# Patient Record
Sex: Female | Born: 1955 | Race: White | Hispanic: No | Marital: Married | Smoking: Former smoker
Health system: Southern US, Community
[De-identification: ages and names within clinical notes are randomized; demographics above are authoritative.]

## PROBLEM LIST (undated history)

## (undated) ENCOUNTER — Ambulatory Visit: Admission: EM | Payer: Medicare HMO | Source: Home / Self Care

## (undated) DIAGNOSIS — IMO0001 Reserved for inherently not codable concepts without codable children: Secondary | ICD-10-CM

## (undated) DIAGNOSIS — C50919 Malignant neoplasm of unspecified site of unspecified female breast: Secondary | ICD-10-CM

## (undated) DIAGNOSIS — M1990 Unspecified osteoarthritis, unspecified site: Secondary | ICD-10-CM

## (undated) DIAGNOSIS — J309 Allergic rhinitis, unspecified: Secondary | ICD-10-CM

## (undated) DIAGNOSIS — G4700 Insomnia, unspecified: Secondary | ICD-10-CM

## (undated) DIAGNOSIS — I10 Essential (primary) hypertension: Secondary | ICD-10-CM

## (undated) DIAGNOSIS — R030 Elevated blood-pressure reading, without diagnosis of hypertension: Secondary | ICD-10-CM

## (undated) DIAGNOSIS — N390 Urinary tract infection, site not specified: Secondary | ICD-10-CM

## (undated) DIAGNOSIS — R5383 Other fatigue: Principal | ICD-10-CM

## (undated) DIAGNOSIS — E785 Hyperlipidemia, unspecified: Secondary | ICD-10-CM

## (undated) DIAGNOSIS — F329 Major depressive disorder, single episode, unspecified: Secondary | ICD-10-CM

## (undated) DIAGNOSIS — Z9221 Personal history of antineoplastic chemotherapy: Secondary | ICD-10-CM

## (undated) DIAGNOSIS — Z923 Personal history of irradiation: Secondary | ICD-10-CM

## (undated) DIAGNOSIS — B019 Varicella without complication: Secondary | ICD-10-CM

## (undated) HISTORY — DX: Insomnia, unspecified: G47.00

## (undated) HISTORY — DX: Hyperlipidemia, unspecified: E78.5

## (undated) HISTORY — DX: Urinary tract infection, site not specified: N39.0

## (undated) HISTORY — DX: Reserved for inherently not codable concepts without codable children: IMO0001

## (undated) HISTORY — DX: Allergic rhinitis, unspecified: J30.9

## (undated) HISTORY — PX: TONSILLECTOMY: SUR1361

## (undated) HISTORY — DX: Major depressive disorder, single episode, unspecified: F32.9

## (undated) HISTORY — DX: Varicella without complication: B01.9

## (undated) HISTORY — PX: ABDOMINAL HYSTERECTOMY: SHX81

## (undated) HISTORY — DX: Elevated blood-pressure reading, without diagnosis of hypertension: R03.0

## (undated) HISTORY — DX: Essential (primary) hypertension: I10

## (undated) HISTORY — DX: Other fatigue: R53.83

## (undated) HISTORY — DX: Unspecified osteoarthritis, unspecified site: M19.90

## (undated) HISTORY — PX: TUBAL LIGATION: SHX77

---

## 2011-07-19 ENCOUNTER — Ambulatory Visit: Payer: Self-pay

## 2011-07-29 HISTORY — PX: BREAST BIOPSY: SHX20

## 2011-08-02 ENCOUNTER — Ambulatory Visit: Payer: Self-pay | Admitting: Oncology

## 2011-08-02 LAB — COMPREHENSIVE METABOLIC PANEL
Albumin: 4.5 g/dL (ref 3.4–5.0)
Alkaline Phosphatase: 59 U/L (ref 50–136)
BUN: 12 mg/dL (ref 7–18)
Bilirubin,Total: 0.4 mg/dL (ref 0.2–1.0)
Calcium, Total: 9.5 mg/dL (ref 8.5–10.1)
Chloride: 101 mmol/L (ref 98–107)
Co2: 29 mmol/L (ref 21–32)
Glucose: 104 mg/dL — ABNORMAL HIGH (ref 65–99)
Osmolality: 276 (ref 275–301)
Potassium: 3.6 mmol/L (ref 3.5–5.1)
SGOT(AST): 28 U/L (ref 15–37)
Sodium: 138 mmol/L (ref 136–145)
Total Protein: 8 g/dL (ref 6.4–8.2)

## 2011-08-02 LAB — CBC CANCER CENTER
Basophil #: 0 x10 3/mm (ref 0.0–0.1)
Eosinophil %: 2.1 %
HCT: 44.4 % (ref 35.0–47.0)
HGB: 15.3 g/dL (ref 12.0–16.0)
Lymphocyte #: 2.6 x10 3/mm (ref 1.0–3.6)
MCH: 32 pg (ref 26.0–34.0)
MCHC: 34.4 g/dL (ref 32.0–36.0)
Monocyte #: 0.4 x10 3/mm (ref 0.0–0.7)
Platelet: 218 x10 3/mm (ref 150–440)
RDW: 13.1 % (ref 11.5–14.5)

## 2011-08-03 LAB — CANCER ANTIGEN 27.29: CA 27.29: 14.8 U/mL (ref 0.0–38.6)

## 2011-08-09 ENCOUNTER — Ambulatory Visit: Payer: Self-pay | Admitting: Oncology

## 2011-08-16 ENCOUNTER — Ambulatory Visit: Payer: Self-pay | Admitting: General Surgery

## 2011-08-16 DIAGNOSIS — C50912 Malignant neoplasm of unspecified site of left female breast: Secondary | ICD-10-CM | POA: Insufficient documentation

## 2011-08-29 ENCOUNTER — Ambulatory Visit: Payer: Self-pay | Admitting: Oncology

## 2011-08-29 DIAGNOSIS — Z9221 Personal history of antineoplastic chemotherapy: Secondary | ICD-10-CM

## 2011-08-29 HISTORY — DX: Personal history of antineoplastic chemotherapy: Z92.21

## 2011-08-31 LAB — CBC CANCER CENTER
Basophil #: 0 x10 3/mm (ref 0.0–0.1)
Basophil %: 0.6 %
Eosinophil #: 0.3 x10 3/mm (ref 0.0–0.7)
Eosinophil %: 4 %
HCT: 40.4 % (ref 35.0–47.0)
Lymphocyte #: 2.4 x10 3/mm (ref 1.0–3.6)
Lymphocyte %: 35.9 %
MCH: 32.1 pg (ref 26.0–34.0)
MCV: 94 fL (ref 80–100)
Monocyte %: 5.5 %
Neutrophil #: 3.6 x10 3/mm (ref 1.4–6.5)
Neutrophil %: 54 %
Platelet: 193 x10 3/mm (ref 150–440)
RDW: 12.9 % (ref 11.5–14.5)
WBC: 6.7 x10 3/mm (ref 3.6–11.0)

## 2011-08-31 LAB — BASIC METABOLIC PANEL
Anion Gap: 12 (ref 7–16)
BUN: 12 mg/dL (ref 7–18)
Calcium, Total: 8.9 mg/dL (ref 8.5–10.1)
Co2: 29 mmol/L (ref 21–32)
EGFR (African American): >60
Glucose: 112 mg/dL — ABNORMAL HIGH (ref 65–99)
Osmolality: 282 (ref 275–301)
Sodium: 141 mmol/L (ref 136–145)

## 2011-09-07 LAB — CBC CANCER CENTER
Basophil #: 0 x10 3/mm (ref 0.0–0.1)
Basophil %: 0.2 %
Eosinophil #: 0.2 x10 3/mm (ref 0.0–0.7)
Eosinophil %: 3.5 %
HCT: 38.7 % (ref 35.0–47.0)
HGB: 13.5 g/dL (ref 12.0–16.0)
MCHC: 34.8 g/dL (ref 32.0–36.0)
MCV: 93 fL (ref 80–100)
Neutrophil %: 66.2 %
RBC: 4.15 10*6/uL (ref 3.80–5.20)
RDW: 13 % (ref 11.5–14.5)
WBC: 5.1 x10 3/mm (ref 3.6–11.0)

## 2011-09-14 LAB — CBC CANCER CENTER
Basophil %: 1.4 %
Eosinophil #: 0 x10 3/mm (ref 0.0–0.7)
Eosinophil %: 1.9 %
HCT: 40.1 % (ref 35.0–47.0)
HGB: 13.4 g/dL (ref 12.0–16.0)
Lymphocyte %: 73.2 %
MCV: 95 fL (ref 80–100)
Monocyte #: 0.3 x10 3/mm (ref 0.2–0.9)
Monocyte %: 12.8 %
Neutrophil #: 0.2 x10 3/mm — ABNORMAL LOW (ref 1.4–6.5)
Neutrophil %: 10.7 %
Platelet: 236 x10 3/mm (ref 150–440)
RBC: 4.22 10*6/uL (ref 3.80–5.20)
RDW: 12.5 % (ref 11.5–14.5)
WBC: 2.2 x10 3/mm — ABNORMAL LOW (ref 3.6–11.0)

## 2011-09-21 LAB — CBC CANCER CENTER
Basophil #: 0 x10 3/mm (ref 0.0–0.1)
Basophil %: 0.5 %
Eosinophil #: 0 x10 3/mm (ref 0.0–0.7)
Eosinophil %: 0.5 %
Lymphocyte #: 2.1 x10 3/mm (ref 1.0–3.6)
Lymphocyte %: 26.3 %
MCH: 32.3 pg (ref 26.0–34.0)
MCHC: 33.7 g/dL (ref 32.0–36.0)
MCV: 96 fL (ref 80–100)
Neutrophil #: 5.3 x10 3/mm (ref 1.4–6.5)
Neutrophil %: 67.2 %
Platelet: 224 x10 3/mm (ref 150–440)
RBC: 4.09 10*6/uL (ref 3.80–5.20)
WBC: 7.9 x10 3/mm (ref 3.6–11.0)

## 2011-09-21 LAB — COMPREHENSIVE METABOLIC PANEL
Albumin: 3.8 g/dL (ref 3.4–5.0)
Alkaline Phosphatase: 78 U/L (ref 50–136)
BUN: 8 mg/dL (ref 7–18)
Bilirubin,Total: 0.2 mg/dL (ref 0.2–1.0)
Calcium, Total: 8.8 mg/dL (ref 8.5–10.1)
Chloride: 100 mmol/L (ref 98–107)
Co2: 32 mmol/L (ref 21–32)
Creatinine: 0.69 mg/dL (ref 0.60–1.30)
EGFR (African American): >60
EGFR (Non-African Amer.): >60
Osmolality: 285 (ref 275–301)
Potassium: 2.9 mmol/L — ABNORMAL LOW (ref 3.5–5.1)
SGOT(AST): 25 U/L (ref 15–37)
Sodium: 142 mmol/L (ref 136–145)
Total Protein: 7.2 g/dL (ref 6.4–8.2)

## 2011-09-28 ENCOUNTER — Ambulatory Visit: Payer: Self-pay | Admitting: Oncology

## 2011-09-28 LAB — CBC CANCER CENTER
Basophil %: 1.2 %
Eosinophil #: 0.1 x10 3/mm (ref 0.0–0.7)
Eosinophil %: 3.4 %
HCT: 38.4 % (ref 35.0–47.0)
Lymphocyte %: 59.9 %
MCH: 31.9 pg (ref 26.0–34.0)
MCV: 96 fL (ref 80–100)
Neutrophil #: 0.5 x10 3/mm — ABNORMAL LOW (ref 1.4–6.5)
RBC: 4.02 10*6/uL (ref 3.80–5.20)
RDW: 13.5 % (ref 11.5–14.5)
WBC: 1.7 x10 3/mm — CL (ref 3.6–11.0)

## 2011-09-29 LAB — POTASSIUM: Potassium: 3.7 mmol/L (ref 3.5–5.1)

## 2011-09-29 LAB — MAGNESIUM: Magnesium: 2 mg/dL

## 2011-10-05 LAB — CREATININE, SERUM
Creatinine: 0.62 mg/dL (ref 0.60–1.30)
EGFR (African American): >60

## 2011-10-05 LAB — CBC CANCER CENTER
Basophil #: 0.1 x10 3/mm (ref 0.0–0.1)
Basophil %: 0.5 %
Eosinophil #: 0.1 x10 3/mm (ref 0.0–0.7)
HCT: 39.8 % (ref 35.0–47.0)
Lymphocyte #: 1.9 x10 3/mm (ref 1.0–3.6)
MCHC: 33.3 g/dL (ref 32.0–36.0)
MCV: 95 fL (ref 80–100)
Monocyte #: 0.6 x10 3/mm (ref 0.2–0.9)
Monocyte %: 4.9 %
Neutrophil %: 79.3 %
Platelet: 123 x10 3/mm — ABNORMAL LOW (ref 150–440)
WBC: 12.5 x10 3/mm — ABNORMAL HIGH (ref 3.6–11.0)

## 2011-10-05 LAB — HEPATIC FUNCTION PANEL A (ARMC)
Bilirubin, Direct: 0 mg/dL (ref 0.00–0.20)
Bilirubin,Total: 0.2 mg/dL (ref 0.2–1.0)
SGOT(AST): 30 U/L (ref 15–37)
SGPT (ALT): 32 U/L

## 2011-10-12 LAB — CBC CANCER CENTER
Basophil #: 0 x10 3/mm (ref 0.0–0.1)
Eosinophil #: 0 x10 3/mm (ref 0.0–0.7)
Eosinophil %: 0.4 %
HCT: 36.1 % (ref 35.0–47.0)
Lymphocyte #: 1.2 x10 3/mm (ref 1.0–3.6)
Lymphocyte %: 16.4 %
MCV: 97 fL (ref 80–100)
Monocyte #: 0.6 x10 3/mm (ref 0.2–0.9)
Monocyte %: 8.8 %
Neutrophil #: 5.2 x10 3/mm (ref 1.4–6.5)
Neutrophil %: 73.9 %
RDW: 14.8 % — ABNORMAL HIGH (ref 11.5–14.5)

## 2011-10-19 LAB — CBC CANCER CENTER
Basophil %: 0.7 %
Eosinophil #: 0.1 x10 3/mm (ref 0.0–0.7)
Eosinophil %: 2.2 %
HGB: 11.9 g/dL — ABNORMAL LOW (ref 12.0–16.0)
Lymphocyte #: 0.8 x10 3/mm — ABNORMAL LOW (ref 1.0–3.6)
Lymphocyte %: 24.9 %
MCHC: 33.6 g/dL (ref 32.0–36.0)
MCV: 96 fL (ref 80–100)
Monocyte #: 0.1 x10 3/mm — ABNORMAL LOW (ref 0.2–0.9)
Monocyte %: 4.3 %
Neutrophil %: 67.9 %

## 2011-10-26 LAB — CBC CANCER CENTER
Basophil #: 0.1 x10 3/mm (ref 0.0–0.1)
Basophil %: 0.7 %
Eosinophil #: 0.1 x10 3/mm (ref 0.0–0.7)
Eosinophil %: 0.8 %
HGB: 12.4 g/dL (ref 12.0–16.0)
Lymphocyte #: 1.4 x10 3/mm (ref 1.0–3.6)
Lymphocyte %: 13.2 %
MCHC: 33.5 g/dL (ref 32.0–36.0)
MCV: 98 fL (ref 80–100)
Monocyte #: 0.6 x10 3/mm (ref 0.2–0.9)
Monocyte %: 5.8 %
Neutrophil %: 79.5 %
RBC: 3.78 10*6/uL — ABNORMAL LOW (ref 3.80–5.20)
RDW: 16.5 % — ABNORMAL HIGH (ref 11.5–14.5)
WBC: 11 x10 3/mm (ref 3.6–11.0)

## 2011-10-29 ENCOUNTER — Ambulatory Visit: Payer: Self-pay | Admitting: Oncology

## 2011-11-02 LAB — CBC CANCER CENTER
Eosinophil %: 0.5 %
HGB: 12 g/dL (ref 12.0–16.0)
Lymphocyte #: 0.8 x10 3/mm — ABNORMAL LOW (ref 1.0–3.6)
Lymphocyte %: 11.6 %
MCHC: 33.4 g/dL (ref 32.0–36.0)
Monocyte #: 0.6 x10 3/mm (ref 0.2–0.9)
Neutrophil #: 5.7 x10 3/mm (ref 1.4–6.5)
Neutrophil %: 79 %
RBC: 3.62 10*6/uL — ABNORMAL LOW (ref 3.80–5.20)

## 2011-11-02 LAB — COMPREHENSIVE METABOLIC PANEL
Alkaline Phosphatase: 64 U/L (ref 50–136)
Anion Gap: 9 (ref 7–16)
Chloride: 102 mmol/L (ref 98–107)
Co2: 29 mmol/L (ref 21–32)
EGFR (Non-African Amer.): >60
Glucose: 127 mg/dL — ABNORMAL HIGH (ref 65–99)
Osmolality: 279 (ref 275–301)
Sodium: 140 mmol/L (ref 136–145)

## 2011-11-09 LAB — CBC CANCER CENTER
Basophil %: 1.3 %
Eosinophil #: 0.1 x10 3/mm (ref 0.0–0.7)
Lymphocyte %: 40.9 %
MCH: 33.4 pg (ref 26.0–34.0)
Monocyte #: 0.2 x10 3/mm (ref 0.2–0.9)
Monocyte %: 8 %
Neutrophil #: 1 x10 3/mm — ABNORMAL LOW (ref 1.4–6.5)
WBC: 2 x10 3/mm — CL (ref 3.6–11.0)

## 2011-11-16 LAB — CBC CANCER CENTER
Basophil %: 0.5 %
Eosinophil %: 0.5 %
HCT: 36.4 % (ref 35.0–47.0)
Lymphocyte %: 11.3 %
MCH: 33.3 pg (ref 26.0–34.0)
MCV: 101 fL — ABNORMAL HIGH (ref 80–100)
Monocyte #: 0.3 x10 3/mm (ref 0.2–0.9)
Neutrophil #: 7.2 x10 3/mm — ABNORMAL HIGH (ref 1.4–6.5)
RBC: 3.6 10*6/uL — ABNORMAL LOW (ref 3.80–5.20)
RDW: 17.9 % — ABNORMAL HIGH (ref 11.5–14.5)
WBC: 8.5 x10 3/mm (ref 3.6–11.0)

## 2011-11-23 LAB — CBC CANCER CENTER
Basophil #: 0 x10 3/mm (ref 0.0–0.1)
Eosinophil %: 0.2 %
HCT: 35.4 % (ref 35.0–47.0)
HGB: 11.7 g/dL — ABNORMAL LOW (ref 12.0–16.0)
MCHC: 33.2 g/dL (ref 32.0–36.0)
Monocyte #: 0.7 x10 3/mm (ref 0.2–0.9)
Monocyte %: 8.3 %
Platelet: 237 x10 3/mm (ref 150–440)
RBC: 3.47 10*6/uL — ABNORMAL LOW (ref 3.80–5.20)
RDW: 18.2 % — ABNORMAL HIGH (ref 11.5–14.5)

## 2011-11-23 LAB — COMPREHENSIVE METABOLIC PANEL
Albumin: 3.9 g/dL (ref 3.4–5.0)
Alkaline Phosphatase: 59 U/L (ref 50–136)
BUN: 12 mg/dL (ref 7–18)
Calcium, Total: 8.9 mg/dL (ref 8.5–10.1)
Chloride: 103 mmol/L (ref 98–107)
Co2: 27 mmol/L (ref 21–32)
Osmolality: 282 (ref 275–301)
Potassium: 3.6 mmol/L (ref 3.5–5.1)
SGPT (ALT): 31 U/L
Total Protein: 7 g/dL (ref 6.4–8.2)

## 2011-11-28 ENCOUNTER — Ambulatory Visit: Payer: Self-pay | Admitting: Oncology

## 2011-11-30 LAB — COMPREHENSIVE METABOLIC PANEL
Alkaline Phosphatase: 64 U/L (ref 50–136)
Calcium, Total: 9.3 mg/dL (ref 8.5–10.1)
Chloride: 104 mmol/L (ref 98–107)
Co2: 28 mmol/L (ref 21–32)
Creatinine: 0.65 mg/dL (ref 0.60–1.30)
EGFR (African American): >60
EGFR (Non-African Amer.): >60
SGOT(AST): 23 U/L (ref 15–37)
SGPT (ALT): 35 U/L

## 2011-11-30 LAB — CBC CANCER CENTER
Basophil #: 0 x10 3/mm (ref 0.0–0.1)
Basophil %: 0.8 %
Eosinophil #: 0.1 x10 3/mm (ref 0.0–0.7)
HCT: 34.9 % — ABNORMAL LOW (ref 35.0–47.0)
HGB: 11.7 g/dL — ABNORMAL LOW (ref 12.0–16.0)
Lymphocyte #: 1 x10 3/mm (ref 1.0–3.6)
Lymphocyte %: 20.5 %
MCH: 34.6 pg — ABNORMAL HIGH (ref 26.0–34.0)
MCHC: 33.5 g/dL (ref 32.0–36.0)
MCV: 103 fL — ABNORMAL HIGH (ref 80–100)
Monocyte %: 6.5 %
Neutrophil #: 3.4 x10 3/mm (ref 1.4–6.5)
RDW: 17.1 % — ABNORMAL HIGH (ref 11.5–14.5)

## 2011-12-07 LAB — CBC CANCER CENTER
Basophil #: 0 x10 3/mm (ref 0.0–0.1)
Basophil %: 0.9 %
Eosinophil #: 0.2 x10 3/mm (ref 0.0–0.7)
Eosinophil %: 4 %
HCT: 36.5 % (ref 35.0–47.0)
HGB: 12.3 g/dL (ref 12.0–16.0)
Lymphocyte #: 0.8 x10 3/mm — ABNORMAL LOW (ref 1.0–3.6)
MCH: 35.1 pg — ABNORMAL HIGH (ref 26.0–34.0)
MCV: 104 fL — ABNORMAL HIGH (ref 80–100)
Monocyte %: 6.6 %
Neutrophil #: 3.7 x10 3/mm (ref 1.4–6.5)
Neutrophil %: 73 %
Platelet: 195 x10 3/mm (ref 150–440)
RBC: 3.5 10*6/uL — ABNORMAL LOW (ref 3.80–5.20)
WBC: 5.1 x10 3/mm (ref 3.6–11.0)

## 2011-12-14 LAB — CBC CANCER CENTER
Basophil #: 0.1 x10 3/mm (ref 0.0–0.1)
Basophil %: 1.1 %
Eosinophil #: 0.2 x10 3/mm (ref 0.0–0.7)
HCT: 36.2 % (ref 35.0–47.0)
HGB: 12.1 g/dL (ref 12.0–16.0)
Lymphocyte #: 0.9 x10 3/mm — ABNORMAL LOW (ref 1.0–3.6)
Lymphocyte %: 17.9 %
MCH: 35.1 pg — ABNORMAL HIGH (ref 26.0–34.0)
MCHC: 33.5 g/dL (ref 32.0–36.0)
MCV: 105 fL — ABNORMAL HIGH (ref 80–100)
Monocyte #: 0.3 x10 3/mm (ref 0.2–0.9)
Neutrophil #: 3.7 x10 3/mm (ref 1.4–6.5)
Neutrophil %: 71.7 %
Platelet: 199 x10 3/mm (ref 150–440)
RDW: 15 % — ABNORMAL HIGH (ref 11.5–14.5)
WBC: 5.1 x10 3/mm (ref 3.6–11.0)

## 2011-12-14 LAB — COMPREHENSIVE METABOLIC PANEL
Albumin: 3.9 g/dL (ref 3.4–5.0)
Alkaline Phosphatase: 54 U/L (ref 50–136)
Anion Gap: 9 (ref 7–16)
BUN: 9 mg/dL (ref 7–18)
Bilirubin,Total: 0.3 mg/dL (ref 0.2–1.0)
Calcium, Total: 9.1 mg/dL (ref 8.5–10.1)
Glucose: 109 mg/dL — ABNORMAL HIGH (ref 65–99)
Osmolality: 281 (ref 275–301)
Potassium: 3.5 mmol/L (ref 3.5–5.1)
SGOT(AST): 25 U/L (ref 15–37)
SGPT (ALT): 38 U/L
Sodium: 141 mmol/L (ref 136–145)
Total Protein: 7 g/dL (ref 6.4–8.2)

## 2011-12-21 LAB — CBC CANCER CENTER
Basophil #: 0 x10 3/mm (ref 0.0–0.1)
Basophil %: 0.7 %
Eosinophil #: 0.1 x10 3/mm (ref 0.0–0.7)
HGB: 12.2 g/dL (ref 12.0–16.0)
Lymphocyte %: 14.6 %
MCHC: 33.9 g/dL (ref 32.0–36.0)
MCV: 105 fL — ABNORMAL HIGH (ref 80–100)
Monocyte %: 7.1 %
Neutrophil #: 4.6 x10 3/mm (ref 1.4–6.5)
Neutrophil %: 75.8 %
RBC: 3.43 10*6/uL — ABNORMAL LOW (ref 3.80–5.20)
WBC: 6.1 x10 3/mm (ref 3.6–11.0)

## 2011-12-21 LAB — COMPREHENSIVE METABOLIC PANEL
Alkaline Phosphatase: 64 U/L (ref 50–136)
Anion Gap: 10 (ref 7–16)
Bilirubin,Total: 0.3 mg/dL (ref 0.2–1.0)
Chloride: 102 mmol/L (ref 98–107)
Co2: 29 mmol/L (ref 21–32)
Creatinine: 0.7 mg/dL (ref 0.60–1.30)
EGFR (African American): >60
EGFR (Non-African Amer.): >60
Potassium: 3.5 mmol/L (ref 3.5–5.1)
SGOT(AST): 24 U/L (ref 15–37)
SGPT (ALT): 38 U/L

## 2011-12-28 LAB — COMPREHENSIVE METABOLIC PANEL
Albumin: 4.1 g/dL (ref 3.4–5.0)
Alkaline Phosphatase: 64 U/L (ref 50–136)
BUN: 7 mg/dL (ref 7–18)
Bilirubin,Total: 0.4 mg/dL (ref 0.2–1.0)
Chloride: 100 mmol/L (ref 98–107)
Creatinine: 0.73 mg/dL (ref 0.60–1.30)
EGFR (African American): >60
Glucose: 133 mg/dL — ABNORMAL HIGH (ref 65–99)
Osmolality: 277 (ref 275–301)
SGPT (ALT): 36 U/L
Total Protein: 7.5 g/dL (ref 6.4–8.2)

## 2011-12-28 LAB — CBC CANCER CENTER
Basophil %: 1 %
Eosinophil #: 0.1 x10 3/mm (ref 0.0–0.7)
Eosinophil %: 1.5 %
HCT: 37.3 % (ref 35.0–47.0)
HGB: 12.9 g/dL (ref 12.0–16.0)
Lymphocyte %: 17.5 %
Monocyte %: 4.7 %
Neutrophil #: 4.5 x10 3/mm (ref 1.4–6.5)
WBC: 6 x10 3/mm (ref 3.6–11.0)

## 2011-12-29 ENCOUNTER — Ambulatory Visit: Payer: Self-pay | Admitting: Oncology

## 2012-01-04 LAB — CBC CANCER CENTER
Basophil %: 0.6 %
Eosinophil %: 1.7 %
HCT: 36.2 % (ref 35.0–47.0)
HGB: 12.5 g/dL (ref 12.0–16.0)
Lymphocyte %: 15.3 %
MCH: 36.7 pg — ABNORMAL HIGH (ref 26.0–34.0)
MCHC: 34.6 g/dL (ref 32.0–36.0)
MCV: 106 fL — ABNORMAL HIGH (ref 80–100)
Monocyte %: 7.2 %
Neutrophil #: 3.9 x10 3/mm (ref 1.4–6.5)
Neutrophil %: 75.2 %
Platelet: 258 x10 3/mm (ref 150–440)
RBC: 3.42 10*6/uL — ABNORMAL LOW (ref 3.80–5.20)
RDW: 13.1 % (ref 11.5–14.5)
WBC: 5.2 x10 3/mm (ref 3.6–11.0)

## 2012-01-11 LAB — CBC CANCER CENTER
Basophil %: 0.7 %
Eosinophil #: 0 x10 3/mm (ref 0.0–0.7)
Eosinophil %: 0.6 %
HGB: 12.1 g/dL (ref 12.0–16.0)
MCH: 35.3 pg — ABNORMAL HIGH (ref 26.0–34.0)
MCV: 105 fL — ABNORMAL HIGH (ref 80–100)
Monocyte #: 0.3 x10 3/mm (ref 0.2–0.9)
Monocyte %: 6.2 %
Neutrophil #: 3.7 x10 3/mm (ref 1.4–6.5)
Neutrophil %: 82.3 %
Platelet: 206 x10 3/mm (ref 150–440)
RBC: 3.43 10*6/uL — ABNORMAL LOW (ref 3.80–5.20)
WBC: 4.6 x10 3/mm (ref 3.6–11.0)

## 2012-01-11 LAB — COMPREHENSIVE METABOLIC PANEL
Albumin: 4.1 g/dL (ref 3.4–5.0)
Alkaline Phosphatase: 72 U/L (ref 50–136)
Anion Gap: 11 (ref 7–16)
BUN: 8 mg/dL (ref 7–18)
Bilirubin,Total: 0.4 mg/dL (ref 0.2–1.0)
Co2: 29 mmol/L (ref 21–32)
Creatinine: 0.81 mg/dL (ref 0.60–1.30)
Glucose: 119 mg/dL — ABNORMAL HIGH (ref 65–99)
Potassium: 3 mmol/L — ABNORMAL LOW (ref 3.5–5.1)
SGPT (ALT): 35 U/L (ref 12–78)
Total Protein: 7.3 g/dL (ref 6.4–8.2)

## 2012-01-18 LAB — CBC CANCER CENTER
Basophil #: 0.1 x10 3/mm (ref 0.0–0.1)
Basophil %: 1.4 %
Eosinophil #: 0.1 x10 3/mm (ref 0.0–0.7)
Eosinophil %: 1 %
HGB: 12.6 g/dL (ref 12.0–16.0)
Lymphocyte %: 19.6 %
MCH: 35.3 pg — ABNORMAL HIGH (ref 26.0–34.0)
MCHC: 33.7 g/dL (ref 32.0–36.0)
Monocyte #: 0.3 x10 3/mm (ref 0.2–0.9)
Monocyte %: 5.6 %
Neutrophil #: 4.1 x10 3/mm (ref 1.4–6.5)
Neutrophil %: 72.4 %
RBC: 3.55 10*6/uL — ABNORMAL LOW (ref 3.80–5.20)
WBC: 5.7 x10 3/mm (ref 3.6–11.0)

## 2012-01-18 LAB — COMPREHENSIVE METABOLIC PANEL
Albumin: 4.1 g/dL (ref 3.4–5.0)
Anion Gap: 7 (ref 7–16)
BUN: 7 mg/dL (ref 7–18)
Bilirubin,Total: 0.3 mg/dL (ref 0.2–1.0)
Chloride: 102 mmol/L (ref 98–107)
Creatinine: 0.68 mg/dL (ref 0.60–1.30)
EGFR (African American): >60
Glucose: 115 mg/dL — ABNORMAL HIGH (ref 65–99)
SGOT(AST): 28 U/L (ref 15–37)
SGPT (ALT): 32 U/L (ref 12–78)
Sodium: 139 mmol/L (ref 136–145)
Total Protein: 7.3 g/dL (ref 6.4–8.2)

## 2012-01-25 LAB — CBC CANCER CENTER
Basophil #: 0 x10 3/mm (ref 0.0–0.1)
Eosinophil #: 0.1 x10 3/mm (ref 0.0–0.7)
Eosinophil %: 1.1 %
Lymphocyte #: 1 x10 3/mm (ref 1.0–3.6)
MCH: 35.3 pg — ABNORMAL HIGH (ref 26.0–34.0)
MCHC: 33.8 g/dL (ref 32.0–36.0)
MCV: 105 fL — ABNORMAL HIGH (ref 80–100)
Monocyte #: 0.4 x10 3/mm (ref 0.2–0.9)
Neutrophil #: 3.8 x10 3/mm (ref 1.4–6.5)
Neutrophil %: 72.5 %
Platelet: 214 x10 3/mm (ref 150–440)
RBC: 3.47 10*6/uL — ABNORMAL LOW (ref 3.80–5.20)
RDW: 13.1 % (ref 11.5–14.5)

## 2012-01-25 LAB — COMPREHENSIVE METABOLIC PANEL
Albumin: 3.9 g/dL (ref 3.4–5.0)
BUN: 7 mg/dL (ref 7–18)
Bilirubin,Total: 0.2 mg/dL (ref 0.2–1.0)
Calcium, Total: 9 mg/dL (ref 8.5–10.1)
Co2: 30 mmol/L (ref 21–32)
Creatinine: 0.78 mg/dL (ref 0.60–1.30)
EGFR (Non-African Amer.): >60
Glucose: 104 mg/dL — ABNORMAL HIGH (ref 65–99)
Osmolality: 278 (ref 275–301)
Potassium: 3.2 mmol/L — ABNORMAL LOW (ref 3.5–5.1)
Sodium: 140 mmol/L (ref 136–145)
Total Protein: 6.9 g/dL (ref 6.4–8.2)

## 2012-01-29 ENCOUNTER — Ambulatory Visit: Payer: Self-pay | Admitting: Oncology

## 2012-02-01 LAB — CBC CANCER CENTER
Basophil %: 0.5 %
Eosinophil %: 1.1 %
Lymphocyte %: 18.2 %
Monocyte #: 0.3 x10 3/mm (ref 0.2–0.9)
Monocyte %: 5.2 %
Neutrophil %: 75 %
Platelet: 210 x10 3/mm (ref 150–440)
RBC: 3.58 10*6/uL — ABNORMAL LOW (ref 3.80–5.20)
WBC: 5.6 x10 3/mm (ref 3.6–11.0)

## 2012-02-01 LAB — COMPREHENSIVE METABOLIC PANEL
Albumin: 3.9 g/dL (ref 3.4–5.0)
Anion Gap: 10 (ref 7–16)
Bilirubin,Total: 0.3 mg/dL (ref 0.2–1.0)
Chloride: 98 mmol/L (ref 98–107)
Co2: 30 mmol/L (ref 21–32)
Creatinine: 0.87 mg/dL (ref 0.60–1.30)
Glucose: 165 mg/dL — ABNORMAL HIGH (ref 65–99)
Potassium: 2.9 mmol/L — ABNORMAL LOW (ref 3.5–5.1)
SGOT(AST): 22 U/L (ref 15–37)
SGPT (ALT): 26 U/L (ref 12–78)
Total Protein: 7.2 g/dL (ref 6.4–8.2)

## 2012-02-08 LAB — CBC CANCER CENTER
Basophil %: 0.4 %
Eosinophil %: 1.1 %
HCT: 37.5 % (ref 35.0–47.0)
HGB: 12.5 g/dL (ref 12.0–16.0)
Lymphocyte #: 0.9 x10 3/mm — ABNORMAL LOW (ref 1.0–3.6)
Lymphocyte %: 16.6 %
MCV: 104 fL — ABNORMAL HIGH (ref 80–100)
Monocyte %: 5.6 %
Neutrophil #: 4.1 x10 3/mm (ref 1.4–6.5)
Neutrophil %: 76.3 %
Platelet: 218 x10 3/mm (ref 150–440)
RBC: 3.59 10*6/uL — ABNORMAL LOW (ref 3.80–5.20)
RDW: 12.8 % (ref 11.5–14.5)

## 2012-02-08 LAB — COMPREHENSIVE METABOLIC PANEL
Albumin: 4 g/dL (ref 3.4–5.0)
Alkaline Phosphatase: 58 U/L (ref 50–136)
Bilirubin,Total: 0.4 mg/dL (ref 0.2–1.0)
Calcium, Total: 9.1 mg/dL (ref 8.5–10.1)
Chloride: 100 mmol/L (ref 98–107)
Creatinine: 0.82 mg/dL (ref 0.60–1.30)
Glucose: 132 mg/dL — ABNORMAL HIGH (ref 65–99)
Osmolality: 278 (ref 275–301)
SGOT(AST): 24 U/L (ref 15–37)
Sodium: 139 mmol/L (ref 136–145)
Total Protein: 7.2 g/dL (ref 6.4–8.2)

## 2012-02-28 ENCOUNTER — Ambulatory Visit: Payer: Self-pay | Admitting: Oncology

## 2012-02-28 DIAGNOSIS — C50919 Malignant neoplasm of unspecified site of unspecified female breast: Secondary | ICD-10-CM

## 2012-02-28 HISTORY — DX: Malignant neoplasm of unspecified site of unspecified female breast: C50.919

## 2012-03-07 ENCOUNTER — Ambulatory Visit: Payer: Self-pay | Admitting: Oncology

## 2012-03-07 LAB — CBC CANCER CENTER
Basophil %: 0.7 %
Eosinophil #: 0.3 x10 3/mm (ref 0.0–0.7)
Eosinophil %: 4.5 %
HCT: 41.5 % (ref 35.0–47.0)
HGB: 13.8 g/dL (ref 12.0–16.0)
Lymphocyte #: 1 x10 3/mm (ref 1.0–3.6)
Lymphocyte %: 15.2 %
MCHC: 33.2 g/dL (ref 32.0–36.0)
MCV: 101 fL — ABNORMAL HIGH (ref 80–100)
Monocyte %: 6.7 %
Neutrophil #: 4.7 x10 3/mm (ref 1.4–6.5)
RBC: 4.1 10*6/uL (ref 3.80–5.20)
RDW: 12.9 % (ref 11.5–14.5)

## 2012-03-07 LAB — COMPREHENSIVE METABOLIC PANEL
Alkaline Phosphatase: 65 U/L (ref 50–136)
Anion Gap: 12 (ref 7–16)
Bilirubin,Total: 0.5 mg/dL (ref 0.2–1.0)
Calcium, Total: 9.4 mg/dL (ref 8.5–10.1)
Co2: 29 mmol/L (ref 21–32)
EGFR (African American): >60
EGFR (Non-African Amer.): >60
Glucose: 117 mg/dL — ABNORMAL HIGH (ref 65–99)
Osmolality: 281 (ref 275–301)
Potassium: 3.1 mmol/L — ABNORMAL LOW (ref 3.5–5.1)
SGOT(AST): 24 U/L (ref 15–37)
SGPT (ALT): 35 U/L (ref 12–78)

## 2012-03-16 ENCOUNTER — Ambulatory Visit: Payer: Self-pay | Admitting: Surgery

## 2012-03-16 HISTORY — PX: BREAST LUMPECTOMY: SHX2

## 2012-03-30 ENCOUNTER — Ambulatory Visit: Payer: Self-pay | Admitting: Oncology

## 2012-04-23 LAB — CBC CANCER CENTER
Basophil #: 0 x10 3/mm (ref 0.0–0.1)
Basophil %: 0.9 %
Eosinophil #: 0.2 x10 3/mm (ref 0.0–0.7)
Eosinophil %: 3.4 %
HGB: 13.9 g/dL (ref 12.0–16.0)
Lymphocyte %: 17.8 %
MCV: 99 fL (ref 80–100)
Monocyte #: 0.4 x10 3/mm (ref 0.2–0.9)
Monocyte %: 6.9 %
Platelet: 188 x10 3/mm (ref 150–440)
RBC: 4.3 10*6/uL (ref 3.80–5.20)
RDW: 12.7 % (ref 11.5–14.5)
WBC: 5.1 x10 3/mm (ref 3.6–11.0)

## 2012-04-29 ENCOUNTER — Ambulatory Visit: Payer: Self-pay | Admitting: Oncology

## 2012-04-30 LAB — CBC CANCER CENTER
Eosinophil #: 0.2 x10 3/mm (ref 0.0–0.7)
Eosinophil %: 3.2 %
MCHC: 34 g/dL (ref 32.0–36.0)
Monocyte %: 6.3 %
Neutrophil #: 3.7 x10 3/mm (ref 1.4–6.5)
Neutrophil %: 72 %
Platelet: 181 x10 3/mm (ref 150–440)
RBC: 4.21 10*6/uL (ref 3.80–5.20)
RDW: 12.5 % (ref 11.5–14.5)
WBC: 5.1 x10 3/mm (ref 3.6–11.0)

## 2012-05-07 LAB — CBC CANCER CENTER
Basophil #: 0 x10 3/mm (ref 0.0–0.1)
Eosinophil #: 0.2 x10 3/mm (ref 0.0–0.7)
Eosinophil %: 3.4 %
HGB: 13.7 g/dL (ref 12.0–16.0)
Lymphocyte #: 0.8 x10 3/mm — ABNORMAL LOW (ref 1.0–3.6)
MCV: 96 fL (ref 80–100)
Monocyte #: 0.3 x10 3/mm (ref 0.2–0.9)
Neutrophil %: 70.4 %
Platelet: 165 x10 3/mm (ref 150–440)
RBC: 4.15 10*6/uL (ref 3.80–5.20)
RDW: 12.9 % (ref 11.5–14.5)
WBC: 4.5 x10 3/mm (ref 3.6–11.0)

## 2012-05-14 LAB — CBC CANCER CENTER
Basophil #: 0 x10 3/mm (ref 0.0–0.1)
Eosinophil #: 0.2 x10 3/mm (ref 0.0–0.7)
HCT: 42.1 % (ref 35.0–47.0)
HGB: 14.5 g/dL (ref 12.0–16.0)
Lymphocyte %: 17.2 %
MCHC: 34.4 g/dL (ref 32.0–36.0)
MCV: 95 fL (ref 80–100)
Monocyte %: 6.3 %
Platelet: 177 x10 3/mm (ref 150–440)
RDW: 12.9 % (ref 11.5–14.5)

## 2012-05-21 LAB — CBC CANCER CENTER
Basophil %: 0.9 %
Eosinophil #: 0.2 x10 3/mm (ref 0.0–0.7)
Eosinophil %: 4.4 %
HCT: 39.2 % (ref 35.0–47.0)
Lymphocyte %: 18.9 %
MCV: 95 fL (ref 80–100)
Neutrophil #: 3.3 x10 3/mm (ref 1.4–6.5)
Platelet: 179 x10 3/mm (ref 150–440)
RBC: 4.15 10*6/uL (ref 3.80–5.20)
WBC: 5 x10 3/mm (ref 3.6–11.0)

## 2012-05-28 LAB — CBC CANCER CENTER
Basophil %: 0.8 %
Eosinophil #: 0.2 x10 3/mm (ref 0.0–0.7)
Eosinophil %: 4.5 %
Lymphocyte #: 0.7 x10 3/mm — ABNORMAL LOW (ref 1.0–3.6)
Lymphocyte %: 14.6 %
MCH: 32.6 pg (ref 26.0–34.0)
MCHC: 34.1 g/dL (ref 32.0–36.0)
Monocyte #: 0.4 x10 3/mm (ref 0.2–0.9)
Neutrophil %: 70.8 %
Platelet: 167 x10 3/mm (ref 150–440)
RBC: 4.35 10*6/uL (ref 3.80–5.20)

## 2012-05-30 ENCOUNTER — Ambulatory Visit: Payer: Self-pay | Admitting: Oncology

## 2012-06-04 LAB — CBC CANCER CENTER
Basophil #: 0.1 x10 3/mm (ref 0.0–0.1)
Basophil %: 0.8 %
Eosinophil %: 3.3 %
HGB: 14.7 g/dL (ref 12.0–16.0)
Lymphocyte #: 0.7 x10 3/mm — ABNORMAL LOW (ref 1.0–3.6)
Lymphocyte %: 10.5 %
MCHC: 34 g/dL (ref 32.0–36.0)
Monocyte %: 5.9 %
Neutrophil #: 5.6 x10 3/mm (ref 1.4–6.5)
Neutrophil %: 79.5 %
Platelet: 205 x10 3/mm (ref 150–440)
RBC: 4.56 10*6/uL (ref 3.80–5.20)
RDW: 13.1 % (ref 11.5–14.5)
WBC: 7.1 x10 3/mm (ref 3.6–11.0)

## 2012-06-07 LAB — COMPREHENSIVE METABOLIC PANEL
Alkaline Phosphatase: 79 U/L (ref 50–136)
Anion Gap: 9 (ref 7–16)
Calcium, Total: 9.2 mg/dL (ref 8.5–10.1)
Co2: 32 mmol/L (ref 21–32)
EGFR (Non-African Amer.): >60
Glucose: 109 mg/dL — ABNORMAL HIGH (ref 65–99)
Osmolality: 281 (ref 275–301)
SGOT(AST): 32 U/L (ref 15–37)
SGPT (ALT): 40 U/L (ref 12–78)
Sodium: 141 mmol/L (ref 136–145)
Total Protein: 7.5 g/dL (ref 6.4–8.2)

## 2012-06-07 LAB — CBC CANCER CENTER
Basophil %: 0.4 %
Eosinophil #: 0.2 x10 3/mm (ref 0.0–0.7)
Lymphocyte #: 0.4 x10 3/mm — ABNORMAL LOW (ref 1.0–3.6)
MCH: 32.1 pg (ref 26.0–34.0)
MCHC: 34.2 g/dL (ref 32.0–36.0)
MCV: 94 fL (ref 80–100)
Monocyte #: 0.5 x10 3/mm (ref 0.2–0.9)
Neutrophil %: 76.7 %
RBC: 4.56 10*6/uL (ref 3.80–5.20)
RDW: 13 % (ref 11.5–14.5)
WBC: 4.8 x10 3/mm (ref 3.6–11.0)

## 2012-06-11 LAB — CBC CANCER CENTER
Basophil #: 0 x10 3/mm (ref 0.0–0.1)
Basophil %: 0.8 %
Eosinophil #: 0.2 x10 3/mm (ref 0.0–0.7)
Eosinophil %: 4.6 %
HGB: 14.2 g/dL (ref 12.0–16.0)
Lymphocyte #: 1.1 x10 3/mm (ref 1.0–3.6)
MCH: 32.2 pg (ref 26.0–34.0)
Monocyte #: 0.4 x10 3/mm (ref 0.2–0.9)
Neutrophil #: 3 x10 3/mm (ref 1.4–6.5)
Neutrophil %: 63.3 %
Platelet: 181 x10 3/mm (ref 150–440)
RDW: 13 % (ref 11.5–14.5)
WBC: 4.7 x10 3/mm (ref 3.6–11.0)

## 2012-06-30 ENCOUNTER — Ambulatory Visit: Payer: Self-pay | Admitting: Oncology

## 2012-07-28 ENCOUNTER — Ambulatory Visit: Payer: Self-pay | Admitting: Oncology

## 2012-08-28 ENCOUNTER — Ambulatory Visit: Payer: Self-pay | Admitting: Oncology

## 2012-09-06 LAB — CBC CANCER CENTER
Basophil #: 0 x10 3/mm (ref 0.0–0.1)
Basophil %: 0.7 %
Eosinophil %: 5.1 %
HCT: 39.9 % (ref 35.0–47.0)
HGB: 13.5 g/dL (ref 12.0–16.0)
Lymphocyte #: 1 x10 3/mm (ref 1.0–3.6)
MCH: 32.1 pg (ref 26.0–34.0)
MCHC: 33.8 g/dL (ref 32.0–36.0)
MCV: 95 fL (ref 80–100)
Monocyte #: 0.4 x10 3/mm (ref 0.2–0.9)
RBC: 4.2 10*6/uL (ref 3.80–5.20)
RDW: 13.5 % (ref 11.5–14.5)
WBC: 5 x10 3/mm (ref 3.6–11.0)

## 2012-09-06 LAB — COMPREHENSIVE METABOLIC PANEL
Alkaline Phosphatase: 62 U/L (ref 50–136)
Anion Gap: 5 — ABNORMAL LOW (ref 7–16)
Calcium, Total: 9.2 mg/dL (ref 8.5–10.1)
Chloride: 102 mmol/L (ref 98–107)
Creatinine: 0.85 mg/dL (ref 0.60–1.30)
EGFR (African American): >60
Glucose: 115 mg/dL — ABNORMAL HIGH (ref 65–99)
Osmolality: 278 (ref 275–301)
Potassium: 3.6 mmol/L (ref 3.5–5.1)
SGOT(AST): 20 U/L (ref 15–37)
SGPT (ALT): 24 U/L (ref 12–78)
Sodium: 139 mmol/L (ref 136–145)

## 2012-09-27 ENCOUNTER — Ambulatory Visit: Payer: Self-pay | Admitting: Oncology

## 2012-10-28 ENCOUNTER — Ambulatory Visit: Payer: Self-pay | Admitting: Oncology

## 2012-11-27 ENCOUNTER — Ambulatory Visit: Payer: Self-pay | Admitting: Oncology

## 2012-12-20 LAB — CBC CANCER CENTER
Basophil #: 0 x10 3/mm (ref 0.0–0.1)
Basophil %: 0.7 %
Eosinophil #: 0.2 x10 3/mm (ref 0.0–0.7)
Eosinophil %: 4.3 %
HGB: 13.7 g/dL (ref 12.0–16.0)
Lymphocyte #: 1.2 x10 3/mm (ref 1.0–3.6)
Lymphocyte %: 24.4 %
MCH: 32.9 pg (ref 26.0–34.0)
MCHC: 34.9 g/dL (ref 32.0–36.0)
Monocyte #: 0.3 x10 3/mm (ref 0.2–0.9)
Monocyte %: 6.5 %
Neutrophil %: 64.1 %
Platelet: 180 x10 3/mm (ref 150–440)
RBC: 4.16 10*6/uL (ref 3.80–5.20)

## 2012-12-20 LAB — COMPREHENSIVE METABOLIC PANEL
Albumin: 4.1 g/dL (ref 3.4–5.0)
Anion Gap: 10 (ref 7–16)
Bilirubin,Total: 0.3 mg/dL (ref 0.2–1.0)
Chloride: 102 mmol/L (ref 98–107)
Co2: 30 mmol/L (ref 21–32)
Creatinine: 0.79 mg/dL (ref 0.60–1.30)
EGFR (African American): >60
EGFR (Non-African Amer.): >60
Glucose: 108 mg/dL — ABNORMAL HIGH (ref 65–99)
Osmolality: 283 (ref 275–301)
Potassium: 3.4 mmol/L — ABNORMAL LOW (ref 3.5–5.1)
SGOT(AST): 16 U/L (ref 15–37)
Total Protein: 7.2 g/dL (ref 6.4–8.2)

## 2012-12-28 ENCOUNTER — Ambulatory Visit: Payer: Self-pay | Admitting: Oncology

## 2013-01-31 ENCOUNTER — Ambulatory Visit: Payer: Self-pay | Admitting: Oncology

## 2013-02-27 ENCOUNTER — Ambulatory Visit: Payer: Self-pay | Admitting: Oncology

## 2013-03-30 ENCOUNTER — Ambulatory Visit: Payer: Self-pay | Admitting: Oncology

## 2013-04-30 ENCOUNTER — Ambulatory Visit: Payer: Self-pay | Admitting: Oncology

## 2013-04-30 LAB — COMPREHENSIVE METABOLIC PANEL
Albumin: 4.3 g/dL (ref 3.4–5.0)
Alkaline Phosphatase: 70 U/L
Anion Gap: 7 (ref 7–16)
BUN: 7 mg/dL (ref 7–18)
Bilirubin,Total: 0.3 mg/dL (ref 0.2–1.0)
Calcium, Total: 10.3 mg/dL — ABNORMAL HIGH (ref 8.5–10.1)
Co2: 31 mmol/L (ref 21–32)
Creatinine: 0.71 mg/dL (ref 0.60–1.30)
EGFR (African American): >60
EGFR (Non-African Amer.): >60
Glucose: 106 mg/dL — ABNORMAL HIGH (ref 65–99)
Osmolality: 278 (ref 275–301)
SGOT(AST): 20 U/L (ref 15–37)
SGPT (ALT): 28 U/L (ref 12–78)
Sodium: 140 mmol/L (ref 136–145)

## 2013-04-30 LAB — CBC CANCER CENTER
HGB: 14.9 g/dL (ref 12.0–16.0)
MCH: 32.3 pg (ref 26.0–34.0)
MCHC: 33.5 g/dL (ref 32.0–36.0)
Monocyte #: 0.3 x10 3/mm (ref 0.2–0.9)
Monocyte %: 4.9 %
Neutrophil %: 68.8 %
Platelet: 199 x10 3/mm (ref 150–440)
RDW: 12.7 % (ref 11.5–14.5)
WBC: 5.7 x10 3/mm (ref 3.6–11.0)

## 2013-05-30 ENCOUNTER — Ambulatory Visit: Payer: Self-pay | Admitting: Oncology

## 2013-06-30 ENCOUNTER — Ambulatory Visit: Payer: Self-pay | Admitting: Oncology

## 2013-09-02 ENCOUNTER — Ambulatory Visit: Payer: Self-pay | Admitting: Oncology

## 2013-09-26 LAB — COMPREHENSIVE METABOLIC PANEL
ALBUMIN: 4 g/dL (ref 3.4–5.0)
ANION GAP: 10 (ref 7–16)
AST: 16 U/L (ref 15–37)
Alkaline Phosphatase: 63 U/L
BUN: 11 mg/dL (ref 7–18)
Bilirubin,Total: 0.3 mg/dL (ref 0.2–1.0)
CALCIUM: 9.4 mg/dL (ref 8.5–10.1)
CHLORIDE: 103 mmol/L (ref 98–107)
Co2: 28 mmol/L (ref 21–32)
Creatinine: 0.75 mg/dL (ref 0.60–1.30)
EGFR (African American): >60
GLUCOSE: 103 mg/dL — AB (ref 65–99)
Osmolality: 281 (ref 275–301)
Potassium: 3.6 mmol/L (ref 3.5–5.1)
SGPT (ALT): 21 U/L (ref 12–78)
SODIUM: 141 mmol/L (ref 136–145)
Total Protein: 7.2 g/dL (ref 6.4–8.2)

## 2013-09-26 LAB — CBC CANCER CENTER
BASOS PCT: 1 %
Basophil #: 0.1 x10 3/mm (ref 0.0–0.1)
EOS PCT: 3 %
Eosinophil #: 0.2 x10 3/mm (ref 0.0–0.7)
HCT: 39.4 % (ref 35.0–47.0)
HGB: 13.4 g/dL (ref 12.0–16.0)
LYMPHS ABS: 1.3 x10 3/mm (ref 1.0–3.6)
Lymphocyte %: 20.1 %
MCH: 31.8 pg (ref 26.0–34.0)
MCHC: 34 g/dL (ref 32.0–36.0)
MCV: 93 fL (ref 80–100)
MONO ABS: 0.3 x10 3/mm (ref 0.2–0.9)
Monocyte %: 5.2 %
Neutrophil #: 4.4 x10 3/mm (ref 1.4–6.5)
Neutrophil %: 70.7 %
Platelet: 196 x10 3/mm (ref 150–440)
RBC: 4.22 10*6/uL (ref 3.80–5.20)
RDW: 13.3 % (ref 11.5–14.5)
WBC: 6.2 x10 3/mm (ref 3.6–11.0)

## 2013-09-27 ENCOUNTER — Ambulatory Visit: Payer: Self-pay | Admitting: Oncology

## 2013-10-23 ENCOUNTER — Ambulatory Visit: Payer: Self-pay

## 2013-11-07 ENCOUNTER — Ambulatory Visit: Payer: Self-pay | Admitting: Oncology

## 2013-11-27 ENCOUNTER — Ambulatory Visit: Payer: Self-pay | Admitting: Oncology

## 2013-12-28 ENCOUNTER — Ambulatory Visit: Payer: Self-pay | Admitting: Oncology

## 2014-02-05 ENCOUNTER — Ambulatory Visit: Payer: Self-pay | Admitting: Oncology

## 2014-02-05 DIAGNOSIS — C50919 Malignant neoplasm of unspecified site of unspecified female breast: Secondary | ICD-10-CM | POA: Diagnosis not present

## 2014-02-05 DIAGNOSIS — I10 Essential (primary) hypertension: Secondary | ICD-10-CM | POA: Diagnosis not present

## 2014-02-05 DIAGNOSIS — Z79899 Other long term (current) drug therapy: Secondary | ICD-10-CM | POA: Diagnosis not present

## 2014-02-05 DIAGNOSIS — R5381 Other malaise: Secondary | ICD-10-CM | POA: Diagnosis not present

## 2014-02-05 DIAGNOSIS — Z79811 Long term (current) use of aromatase inhibitors: Secondary | ICD-10-CM | POA: Diagnosis not present

## 2014-02-05 DIAGNOSIS — R209 Unspecified disturbances of skin sensation: Secondary | ICD-10-CM | POA: Diagnosis not present

## 2014-02-05 DIAGNOSIS — F17200 Nicotine dependence, unspecified, uncomplicated: Secondary | ICD-10-CM | POA: Diagnosis not present

## 2014-02-05 DIAGNOSIS — Z90710 Acquired absence of both cervix and uterus: Secondary | ICD-10-CM | POA: Diagnosis not present

## 2014-02-05 DIAGNOSIS — Z923 Personal history of irradiation: Secondary | ICD-10-CM | POA: Diagnosis not present

## 2014-02-05 DIAGNOSIS — R5383 Other fatigue: Secondary | ICD-10-CM | POA: Diagnosis not present

## 2014-02-05 DIAGNOSIS — Z803 Family history of malignant neoplasm of breast: Secondary | ICD-10-CM | POA: Diagnosis not present

## 2014-02-05 DIAGNOSIS — Z170 Estrogen receptor positive status [ER+]: Secondary | ICD-10-CM | POA: Diagnosis not present

## 2014-02-05 DIAGNOSIS — F411 Generalized anxiety disorder: Secondary | ICD-10-CM | POA: Diagnosis not present

## 2014-02-05 DIAGNOSIS — Z9089 Acquired absence of other organs: Secondary | ICD-10-CM | POA: Diagnosis not present

## 2014-02-05 LAB — COMPREHENSIVE METABOLIC PANEL
ALBUMIN: 4.2 g/dL (ref 3.4–5.0)
ALK PHOS: 58 U/L
ANION GAP: 11 (ref 7–16)
BUN: 10 mg/dL (ref 7–18)
Bilirubin,Total: 0.6 mg/dL (ref 0.2–1.0)
CHLORIDE: 100 mmol/L (ref 98–107)
CREATININE: 0.73 mg/dL (ref 0.60–1.30)
Calcium, Total: 9.1 mg/dL (ref 8.5–10.1)
Co2: 28 mmol/L (ref 21–32)
EGFR (African American): >60
EGFR (Non-African Amer.): >60
Glucose: 108 mg/dL — ABNORMAL HIGH (ref 65–99)
Osmolality: 277 (ref 275–301)
Potassium: 3.3 mmol/L — ABNORMAL LOW (ref 3.5–5.1)
SGOT(AST): 20 U/L (ref 15–37)
SGPT (ALT): 24 U/L
Sodium: 139 mmol/L (ref 136–145)
Total Protein: 7.5 g/dL (ref 6.4–8.2)

## 2014-02-05 LAB — CBC CANCER CENTER
BASOS ABS: 0 x10 3/mm (ref 0.0–0.1)
BASOS PCT: 0.6 %
Eosinophil #: 0.2 x10 3/mm (ref 0.0–0.7)
Eosinophil %: 4 %
HCT: 41.7 % (ref 35.0–47.0)
HGB: 13.9 g/dL (ref 12.0–16.0)
LYMPHS PCT: 25.9 %
Lymphocyte #: 1.6 x10 3/mm (ref 1.0–3.6)
MCH: 31.9 pg (ref 26.0–34.0)
MCHC: 33.3 g/dL (ref 32.0–36.0)
MCV: 96 fL (ref 80–100)
Monocyte #: 0.4 x10 3/mm (ref 0.2–0.9)
Monocyte %: 6.3 %
Neutrophil #: 3.9 x10 3/mm (ref 1.4–6.5)
Neutrophil %: 63.2 %
Platelet: 202 x10 3/mm (ref 150–440)
RBC: 4.35 10*6/uL (ref 3.80–5.20)
RDW: 12.9 % (ref 11.5–14.5)
WBC: 6.2 x10 3/mm (ref 3.6–11.0)

## 2014-02-06 LAB — CANCER ANTIGEN 27.29: CA 27.29: <3.5 U/mL (ref 0.0–38.6)

## 2014-02-27 ENCOUNTER — Ambulatory Visit: Payer: Self-pay | Admitting: Oncology

## 2014-02-27 DIAGNOSIS — Z452 Encounter for adjustment and management of vascular access device: Secondary | ICD-10-CM | POA: Diagnosis not present

## 2014-02-27 DIAGNOSIS — C50412 Malignant neoplasm of upper-outer quadrant of left female breast: Secondary | ICD-10-CM | POA: Diagnosis not present

## 2014-03-30 ENCOUNTER — Ambulatory Visit: Payer: Self-pay | Admitting: Oncology

## 2014-04-30 ENCOUNTER — Ambulatory Visit: Payer: Self-pay | Admitting: Oncology

## 2014-04-30 DIAGNOSIS — C50912 Malignant neoplasm of unspecified site of left female breast: Secondary | ICD-10-CM | POA: Diagnosis not present

## 2014-04-30 DIAGNOSIS — Z452 Encounter for adjustment and management of vascular access device: Secondary | ICD-10-CM | POA: Diagnosis not present

## 2014-05-30 ENCOUNTER — Ambulatory Visit: Payer: Self-pay | Admitting: Oncology

## 2014-05-30 DIAGNOSIS — C50912 Malignant neoplasm of unspecified site of left female breast: Secondary | ICD-10-CM | POA: Diagnosis not present

## 2014-05-30 DIAGNOSIS — Z452 Encounter for adjustment and management of vascular access device: Secondary | ICD-10-CM | POA: Diagnosis not present

## 2014-06-11 DIAGNOSIS — Z452 Encounter for adjustment and management of vascular access device: Secondary | ICD-10-CM | POA: Diagnosis not present

## 2014-06-11 DIAGNOSIS — C50912 Malignant neoplasm of unspecified site of left female breast: Secondary | ICD-10-CM | POA: Diagnosis not present

## 2014-06-16 DIAGNOSIS — Z452 Encounter for adjustment and management of vascular access device: Secondary | ICD-10-CM | POA: Diagnosis not present

## 2014-06-16 DIAGNOSIS — C50412 Malignant neoplasm of upper-outer quadrant of left female breast: Secondary | ICD-10-CM | POA: Diagnosis not present

## 2014-06-16 DIAGNOSIS — C50912 Malignant neoplasm of unspecified site of left female breast: Secondary | ICD-10-CM | POA: Diagnosis not present

## 2014-06-30 ENCOUNTER — Ambulatory Visit: Payer: Self-pay | Admitting: Oncology

## 2014-08-27 ENCOUNTER — Ambulatory Visit
Admit: 2014-08-27 | Disposition: A | Discharge status: Home / Self Care | Payer: Self-pay | Attending: Oncology | Admitting: Oncology

## 2014-08-27 DIAGNOSIS — Z170 Estrogen receptor positive status [ER+]: Secondary | ICD-10-CM | POA: Diagnosis not present

## 2014-08-27 DIAGNOSIS — Z9221 Personal history of antineoplastic chemotherapy: Secondary | ICD-10-CM | POA: Diagnosis not present

## 2014-08-27 DIAGNOSIS — F17210 Nicotine dependence, cigarettes, uncomplicated: Secondary | ICD-10-CM | POA: Diagnosis not present

## 2014-08-27 DIAGNOSIS — Z79899 Other long term (current) drug therapy: Secondary | ICD-10-CM | POA: Diagnosis not present

## 2014-08-27 DIAGNOSIS — Z90710 Acquired absence of both cervix and uterus: Secondary | ICD-10-CM | POA: Diagnosis not present

## 2014-08-27 DIAGNOSIS — C50912 Malignant neoplasm of unspecified site of left female breast: Secondary | ICD-10-CM | POA: Diagnosis not present

## 2014-08-27 DIAGNOSIS — Z79811 Long term (current) use of aromatase inhibitors: Secondary | ICD-10-CM | POA: Diagnosis not present

## 2014-08-27 DIAGNOSIS — I10 Essential (primary) hypertension: Secondary | ICD-10-CM | POA: Diagnosis not present

## 2014-08-27 DIAGNOSIS — R5383 Other fatigue: Secondary | ICD-10-CM | POA: Diagnosis not present

## 2014-08-27 DIAGNOSIS — F419 Anxiety disorder, unspecified: Secondary | ICD-10-CM | POA: Diagnosis not present

## 2014-08-27 DIAGNOSIS — M2550 Pain in unspecified joint: Secondary | ICD-10-CM | POA: Diagnosis not present

## 2014-08-27 DIAGNOSIS — Z923 Personal history of irradiation: Secondary | ICD-10-CM | POA: Diagnosis not present

## 2014-08-27 DIAGNOSIS — R531 Weakness: Secondary | ICD-10-CM | POA: Diagnosis not present

## 2014-08-27 DIAGNOSIS — Z803 Family history of malignant neoplasm of breast: Secondary | ICD-10-CM | POA: Diagnosis not present

## 2014-08-27 DIAGNOSIS — Z9049 Acquired absence of other specified parts of digestive tract: Secondary | ICD-10-CM | POA: Diagnosis not present

## 2014-08-27 DIAGNOSIS — R200 Anesthesia of skin: Secondary | ICD-10-CM | POA: Diagnosis not present

## 2014-08-27 LAB — COMPREHENSIVE METABOLIC PANEL
ALK PHOS: 53 U/L
AST: 24 U/L
Albumin: 4.7 g/dL
Anion Gap: 8 (ref 7–16)
BUN: 13 mg/dL
Bilirubin,Total: 0.6 mg/dL
CALCIUM: 9.4 mg/dL
Chloride: 101 mmol/L
Co2: 25 mmol/L
Creatinine: 0.58 mg/dL
EGFR (African American): >60
EGFR (Non-African Amer.): >60
Glucose: 140 mg/dL — ABNORMAL HIGH
Potassium: 3.3 mmol/L — ABNORMAL LOW
SGPT (ALT): 19 U/L
Sodium: 134 mmol/L — ABNORMAL LOW
Total Protein: 7.5 g/dL

## 2014-08-27 LAB — CBC CANCER CENTER
BASOS ABS: 0 x10 3/mm (ref 0.0–0.1)
Basophil %: 0.6 %
EOS PCT: 4 %
Eosinophil #: 0.2 x10 3/mm (ref 0.0–0.7)
HCT: 40.9 % (ref 35.0–47.0)
HGB: 14 g/dL (ref 12.0–16.0)
LYMPHS PCT: 26.7 %
Lymphocyte #: 1.5 x10 3/mm (ref 1.0–3.6)
MCH: 32.2 pg (ref 26.0–34.0)
MCHC: 34.3 g/dL (ref 32.0–36.0)
MCV: 94 fL (ref 80–100)
MONOS PCT: 5.3 %
Monocyte #: 0.3 x10 3/mm (ref 0.2–0.9)
Neutrophil #: 3.5 x10 3/mm (ref 1.4–6.5)
Neutrophil %: 63.4 %
PLATELETS: 194 x10 3/mm (ref 150–440)
RBC: 4.35 10*6/uL (ref 3.80–5.20)
RDW: 13.5 % (ref 11.5–14.5)
WBC: 5.5 x10 3/mm (ref 3.6–11.0)

## 2014-08-28 LAB — CANCER ANTIGEN 27.29: CA 27.29: 12.9 U/mL (ref 0.0–38.6)

## 2014-08-29 ENCOUNTER — Ambulatory Visit
Admit: 2014-08-29 | Disposition: A | Discharge status: Home / Self Care | Payer: Self-pay | Attending: Oncology | Admitting: Oncology

## 2014-08-29 DIAGNOSIS — Z452 Encounter for adjustment and management of vascular access device: Secondary | ICD-10-CM | POA: Diagnosis not present

## 2014-08-29 DIAGNOSIS — C50912 Malignant neoplasm of unspecified site of left female breast: Secondary | ICD-10-CM | POA: Diagnosis not present

## 2014-09-03 DIAGNOSIS — Z452 Encounter for adjustment and management of vascular access device: Secondary | ICD-10-CM | POA: Diagnosis not present

## 2014-09-03 DIAGNOSIS — C50912 Malignant neoplasm of unspecified site of left female breast: Secondary | ICD-10-CM | POA: Diagnosis not present

## 2014-09-12 ENCOUNTER — Other Ambulatory Visit: Payer: Self-pay | Admitting: Oncology

## 2014-09-12 DIAGNOSIS — M810 Age-related osteoporosis without current pathological fracture: Secondary | ICD-10-CM

## 2014-09-12 DIAGNOSIS — Z853 Personal history of malignant neoplasm of breast: Secondary | ICD-10-CM

## 2014-09-16 NOTE — Op Note (Signed)
PATIENT NAME:  Amanda Liu, Amanda Liu MR#:  [id number] DATE OF BIRTH:  1955-09-02  DATE OF PROCEDURE:  03/16/2012  PREOPERATIVE DIAGNOSIS: Carcinoma of the left breast.   POSTOPERATIVE DIAGNOSIS: Carcinoma of the left breast.   PROCEDURE: Left partial mastectomy with axillary sentinel lymph node biopsy and left axillary lymph node dissection.   SURGEON: Loreli Dollar, MD   ANESTHESIA: General.   INDICATIONS: This 59 year old female has a history of a large cancer of the upper aspect of the left breast. She had a PET scan that demonstrated activity in this cancer and also into left axillary lymph nodes. She had preoperative chemotherapy. The tumor did markedly decrease in size. It is noted that it also had some involvement in the skin. She had preoperative injection of radioactive technetium sulfur colloid.   DESCRIPTION OF PROCEDURE: The patient was placed on the operating table in the supine position under general anesthesia. The left arm was placed on a lateral arm support. The left breast, axilla, upper arm, and chest wall were prepared with ChloraPrep and draped in a sterile manner.   The gamma counter was used to probe the axilla demonstrating location of radioactivity in the inferior aspect of the left axilla. An oblique incision was made some 5 cm in length, carried down through subcutaneous tissues through superficial fascia, and dissected down deeply within the axillary fat pad and using the gamma counter demonstrated location of radioactivity and found two firm lymph nodes each about 8 mm in size which were just about an inch apart, both of which had radioactivity and were removed in one sample of tissue with some surrounding fatty tissue. The ex vivo count in one lymph node was between 140 and 170 and the count in the other lymph node was between 80 and 110 and these were submitted for sentinel lymph node biopsies. The wound was inspected. Hemostasis was intact.   Next, attention was turned  to do the partial mastectomy. There was abnormality of the skin with induration and slight discoloration and a rounded area at approximately 11 to 12 o'clock position. This abnormal skin was approximately 2 cm across. An elliptical excision was carried out with this incision from 10 o'clock to 2 o'clock position. The lower incision was adjacent to the border of the areola and the other was above the abnormal skin so that the ellipse was approximately 2.5 cm wide. It was carried down through subcutaneous tissues palpating firmness within the breast during the course of dissection and dissection was carried down as far as the deep fascia. The 2 o'clock position of the skin ellipse was tagged with a 3-0 nylon stitch and also margin markers were sutured to the wound to mark the medial, lateral, cranial, caudal, and deep margins and was submitted for pathology. The wound was inspected. Numerous small bleeding points were cauterized. Hemostasis was subsequently intact. Next, portions of fascia in the deeper portion of the wound were approximated with 4-0 Monocryl. I also began to close subcutaneous tissues with interrupted 4-0 chromic and began to close the medial aspect of the wound with a running 4-0 Monocryl. Pathologist called back to report that the two lymph nodes were positive for cancer and also reported that the upper aspect of the skin ellipse had some involvement of cancer in the skin coming up to the edge.   Another sample of skin, which was an ellipse, was removed which was approximately 7 x 3 cm in dimension and the new skin margin was tagged with  a stitch for the pathologist's orientation and submitted for routine pathology. I did not see any remaining abnormality of the skin at this site. Following this, the skin closure of the partial mastectomy incision was carried out with running 4-0 Monocryl subcuticular suture.   Next, the axillary lymph node dissection was elected and enlarged the axillary wound  both anteriorly and posteriorly and dissection was carried down to encounter the axillary fat pad and a block of tissue containing lymph nodes was removed dissecting up to the axillary vein and taking care to avoid injury to the thoracodorsal nerve and long thoracic nerve. The intercostal brachial nerve was resected with the specimen and mobile vessels were clamped and ligated with 4-0 chromic. The specimen was resected and submitted fresh for routine pathology. The wound was inspected and several small bleeding points were cauterized. Hemostasis was subsequently intact. A 19 [place] Blake drain was inserted into the axillary wound and brought out through a separate inferior stab wound and sutured to the skin with 3-0 nylon and was cut to fit. Next, the axillary wound was closed with a running 4-0 Monocryl subcuticular suture. Both wounds were then treated with Dermabond.   The patient tolerated the procedure satisfactorily and was then prepared for transfer to the recovery room.  ____________________________ Lenna Sciara. Rochel Brome, MD jws:drc D: 03/16/2012 15:30:36 ET T: 03/17/2012 08:56:38 ET JOB#: [id number] Loreli Dollar MD ELECTRONICALLY SIGNED 03/17/2012 13:10

## 2014-09-16 NOTE — Consult Note (Signed)
Reason for Visit: This 59 year old Female patient presents to the clinic for initial evaluation of  Breast cancer .   Referred by Dr. Oliva Bustard.  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 Eventually came to see Dr. Jamal Collin R. was noted to have invasive mammary carcinoma invading the skin. She declined mastectomy. She underwent neoadjuvant chemotherapy for ER/PR positive grade 2 overall invasive mammary carcinoma. Tumor did not overexpress HER-2/neu. She underwent neoadjuvant chemotherapy consisting of Cytoxan Adriamycin and Taxol. She went on to have a wide local excision for 3.6 cm residual invasive memory carcinoma overall grade 2. There was skin and dermal involvement. 2 of 2 sentinel lymph nodes were positive for macro metastatic disease as well as 3 of 9 axillary lymph nodes. 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 She has tolerated her surgery well. She is seen today for consideration of adjuvant radiation therapy. She is healing well. She is basically without complaint today specifically denies breast tenderness cough or bone pain. Her wide local excision was completed one week prior.  Past Hx:    Numbness in hands and feet:    Previous smoker quit 1 month ago:    Left breast cancer: Mar  2013   Hypertension:    Right Chest Port A Cath:    D&C:    BTL:    Tonsillectomy:    Hysterectomy - Partial:   Past, Family and Social History:   Past Medical History positive    Cardiovascular hyperlipidemia; hypertension    Past Surgical History T. May, hysterectomy, D&C, Port-A-Cath placed    Family History positive    Family History Comments Sister with breast cancer, grandmother with breast cancer at age 12    Social History positive    Social History Comments Approximately 40-pack-year smoking history, no EtOH abuse history    Additional Past Medical and Surgical History Seen by herself today   Allergies:   Almonds: GI Distress  Sulfa drugs: Unknown  Home Meds:  Home Medications: Medication Instructions Status  anastrozole 1 mg oral tablet 1 tab(s) orally once a day Active  potassium chloride 20 mEq oral tablet, extended release 1 tab(s) orally  2-3 times weeklly  Active  ibuprofen 200 mg oral tablet 2 tab(s) orally once a day, As Needed Active  norco 5 - 325 1 - 2 tabs orally every 4 hours prn Active  amlodipine 5 mg oral tablet 1 tab(s) orally once a day (in the morning) Active  hydrochlorothiazide 12.5 mg oral capsule 1 cap(s) orally once a day (in the morning) Active   Review of Systems:   General negative    Performance Status (ECOG) 0    Skin see HPI    Breast see HPI    Ophthalmologic negative    ENMT negative    Respiratory and Thorax  negative    Cardiovascular negative    Gastrointestinal negative    Genitourinary negative    Musculoskeletal negative    Neurological negative    Psychiatric negative    Hematology/Lymphatics negative    Endocrine negative    Allergic/Immunologic negative    Review of Systems   Patient denies any weight loss, fatigue, weakness, fever, chills or night sweats. Patient denies any loss of vision, blurred vision. Patient denies any ringing  of the ears or hearing loss. No irregular heartbeat.  Patient denies heart murmur or history of fainting. Patient denies any chest pain or pain radiating to her upper extremities. Patient denies any shortness of breath, difficulty breathing at night, cough or hemoptysis. Patient denies any swelling in the lower legs. Patient denies any nausea vomiting, vomiting of blood, or coffee ground material in the vomitus. Patient denies any stomach pain. Patient states has had normal bowel movements no significant constipation or diarrhea. Patient denies any dysuria, hematuria or significant nocturia. Patient denies any problems walking, swelling in the joints or loss of balance. Patient denies any skin changes, loss of hair or loss of weight. Patient denies any excessive worrying or anxiety or significant depression. Patient denies any problems with insomnia. Patient denies excessive thirst, polyuria, polydipsia. Patient denies any swollen glands, patient denies easy bruising or easy bleeding. Patient denies any recent infections, allergies or URI. Patient "s visual fields have not changed significantly in recent time.  Nursing Notes:  Nursing Vital Signs and Chemo Nursing Nursing Notes: *CC Vital Signs Flowsheet:   28-Oct-13 10:54   Pulse Pulse 89   Respirations Respirations 20   SBP SBP 159   DBP DBP 108   Pain Scale (0-10)  0   Height (cm) centimeters 165.1    11:42   Height (cm) centimeters 165.1   Physical Exam:  General/Skin/HEENT:   General normal    Skin normal    Eyes normal    ENMT normal    Head and Neck normal    Additional PE Well-developed well-nourished female in NAD. Lungs are clear to A&P cardiac examination shows regular rate and rhythm. She status post wide local excision of the left breast. Incision is healing well. No dominant mass or nodularity is noted in either breast into position examined. Axillary scar left is also healing well. No axillary or supraclavicular adenopathy is appreciated.   Breasts/Resp/CV/GI/GU:    Respiratory and Thorax normal    Cardiovascular normal    Gastrointestinal normal    Genitourinary normal   MS/Neuro/Psych/Lymph:   Musculoskeletal normal    Neurological normal    Lymphatics normal   Assessment and Plan:  Impression:   pathologic stage IIIB invasive mammary carcinoma (T4 by skin invasion, N2 a, M59 ) in 59 year old female status post neoadjuvant chemotherapy and wide local excision and axillary node dissection of the left breast  Plan:   at this time based on her poor prognostic factors including size her tumor after neoadjuvant chemotherapy. 5 of 11 lymph nodes positive for metastatic disease and dermal involvement believe patient would benefit from adjuvant radiation therapy to her left breast and peripheral lymphatics. Would plan on delivering 5000 cGy to her left breast and peripheral lymphatics. Based on the close margin and skin involvement will also boost or scar another 2000 cGy using electron beam. Risks and benefits of treatment including skin reaction, fatigue, inclusion of some normal superficial lung, alteration blood counts and possibility of increased chances of lymphedema of her left upper extremity were  explained in detail to the patient. I have explained to her the necessity to do exercises with her left upper extremity to try to reduce the chances of lymphedema. I will wait another week or so to begin treatment planning allow the incision to heal. Patient has consented go forward with treatment. I have set her up for CT simulation the end of next week.  I would like to take this opportunity to thank you for allowing me to continue to participate in this patient's care.  CC Referral:   cc: Dr. Tamala Julian   Electronic Signatures: Baruch Gouty, Roda Shutters (MD)  (Signed 28-Oct-13 13:33)  Authored: HPI, Diagnosis, Past Hx, PFSH, Allergies, Home Meds, ROS, Nursing Notes, Physical Exam, Encounter Assessment and Plan, CC Referring Physician   Last Updated: 28-Oct-13  13:33 by Armstead Peaks (MD)

## 2014-10-10 ENCOUNTER — Other Ambulatory Visit: Payer: Self-pay | Admitting: Family Medicine

## 2014-10-28 ENCOUNTER — Ambulatory Visit: Payer: Medicare Other

## 2014-10-28 ENCOUNTER — Ambulatory Visit
Admission: RE | Admit: 2014-10-28 | Discharge: 2014-10-28 | Disposition: A | Discharge status: Home / Self Care | Payer: Medicare Other | Source: Ambulatory Visit | Attending: Oncology | Admitting: Oncology

## 2014-10-28 DIAGNOSIS — M8580 Other specified disorders of bone density and structure, unspecified site: Secondary | ICD-10-CM | POA: Insufficient documentation

## 2014-10-28 DIAGNOSIS — Z780 Asymptomatic menopausal state: Secondary | ICD-10-CM | POA: Insufficient documentation

## 2014-10-28 DIAGNOSIS — M85852 Other specified disorders of bone density and structure, left thigh: Secondary | ICD-10-CM | POA: Diagnosis not present

## 2014-10-28 DIAGNOSIS — Z853 Personal history of malignant neoplasm of breast: Secondary | ICD-10-CM | POA: Insufficient documentation

## 2014-10-28 DIAGNOSIS — R928 Other abnormal and inconclusive findings on diagnostic imaging of breast: Secondary | ICD-10-CM | POA: Diagnosis not present

## 2014-10-28 DIAGNOSIS — M810 Age-related osteoporosis without current pathological fracture: Secondary | ICD-10-CM

## 2014-10-28 HISTORY — DX: Malignant neoplasm of unspecified site of unspecified female breast: C50.919

## 2014-11-12 ENCOUNTER — Other Ambulatory Visit: Payer: Self-pay | Admitting: Family Medicine

## 2014-11-24 DIAGNOSIS — R7989 Other specified abnormal findings of blood chemistry: Secondary | ICD-10-CM | POA: Diagnosis not present

## 2014-11-24 DIAGNOSIS — E780 Pure hypercholesterolemia: Secondary | ICD-10-CM | POA: Diagnosis not present

## 2014-11-24 DIAGNOSIS — Z79899 Other long term (current) drug therapy: Secondary | ICD-10-CM | POA: Diagnosis not present

## 2014-11-24 DIAGNOSIS — R5383 Other fatigue: Secondary | ICD-10-CM | POA: Diagnosis not present

## 2014-11-24 DIAGNOSIS — E559 Vitamin D deficiency, unspecified: Secondary | ICD-10-CM | POA: Diagnosis not present

## 2014-11-26 ENCOUNTER — Inpatient Hospital Stay: Payer: Medicare Other | Attending: Oncology

## 2014-11-26 VITALS — BP 127/84

## 2014-11-26 DIAGNOSIS — Z853 Personal history of malignant neoplasm of breast: Secondary | ICD-10-CM | POA: Insufficient documentation

## 2014-11-26 DIAGNOSIS — Z452 Encounter for adjustment and management of vascular access device: Secondary | ICD-10-CM | POA: Diagnosis not present

## 2014-11-26 MED ORDER — HEPARIN SOD (PORK) LOCK FLUSH 100 UNIT/ML IV SOLN
500.0000 [IU] | Freq: Once | INTRAVENOUS | Status: AC
  Administered 2014-11-26: 500 [IU] via INTRAVENOUS

## 2014-11-26 MED ORDER — HEPARIN SOD (PORK) LOCK FLUSH 100 UNIT/ML IV SOLN
INTRAVENOUS | Status: AC
  Filled 2014-11-26: qty 5

## 2014-11-26 MED ORDER — SODIUM CHLORIDE 0.9 % IJ SOLN
10.0000 mL | INTRAMUSCULAR | Status: DC | PRN
  Administered 2014-11-26: 10 mL via INTRAVENOUS
  Filled 2014-11-26: qty 10

## 2014-12-08 ENCOUNTER — Telehealth: Payer: Self-pay | Admitting: *Deleted

## 2014-12-08 NOTE — Telephone Encounter (Signed)
Stop anastrozole today. Will schedule appt to see md in 1 month to discuss next step. Message sent to scheduling to schedule appt and they will call pt with appt details.

## 2014-12-08 NOTE — Telephone Encounter (Signed)
Called patient and left message that  MD  Wants her to stop her Anastrazole today.  A scheduler will call to set up appointment for one month.

## 2014-12-08 NOTE — Telephone Encounter (Signed)
Patient state she is on Anastrazole, and is having increased bone pain. Over the past few months she has fatigue to the point that it is interfering with her ADL's.  States it comes on suddenly.  Also c/o dropping things. Can no longer walk her dog up the driveway without giving out.  Next appointment is Sept. 28th.   [phone]

## 2015-01-07 ENCOUNTER — Encounter: Payer: Self-pay | Admitting: Oncology

## 2015-01-07 ENCOUNTER — Telehealth: Payer: Self-pay | Admitting: *Deleted

## 2015-01-07 ENCOUNTER — Inpatient Hospital Stay: Payer: Medicare Other | Attending: Oncology | Admitting: Oncology

## 2015-01-07 ENCOUNTER — Inpatient Hospital Stay: Payer: Medicare Other

## 2015-01-07 VITALS — BP 137/85 | HR 84 | Temp 96.7°F | Wt 182.1 lb

## 2015-01-07 DIAGNOSIS — R531 Weakness: Secondary | ICD-10-CM

## 2015-01-07 DIAGNOSIS — C50919 Malignant neoplasm of unspecified site of unspecified female breast: Secondary | ICD-10-CM

## 2015-01-07 DIAGNOSIS — Z9221 Personal history of antineoplastic chemotherapy: Secondary | ICD-10-CM | POA: Diagnosis not present

## 2015-01-07 DIAGNOSIS — Z170 Estrogen receptor positive status [ER+]: Secondary | ICD-10-CM | POA: Insufficient documentation

## 2015-01-07 DIAGNOSIS — Z87891 Personal history of nicotine dependence: Secondary | ICD-10-CM | POA: Diagnosis not present

## 2015-01-07 DIAGNOSIS — Z803 Family history of malignant neoplasm of breast: Secondary | ICD-10-CM

## 2015-01-07 DIAGNOSIS — Z79899 Other long term (current) drug therapy: Secondary | ICD-10-CM | POA: Diagnosis not present

## 2015-01-07 DIAGNOSIS — Z79811 Long term (current) use of aromatase inhibitors: Secondary | ICD-10-CM | POA: Diagnosis not present

## 2015-01-07 DIAGNOSIS — R5383 Other fatigue: Secondary | ICD-10-CM | POA: Diagnosis not present

## 2015-01-07 DIAGNOSIS — M2550 Pain in unspecified joint: Secondary | ICD-10-CM

## 2015-01-07 DIAGNOSIS — C50912 Malignant neoplasm of unspecified site of left female breast: Secondary | ICD-10-CM | POA: Diagnosis not present

## 2015-01-07 DIAGNOSIS — Z923 Personal history of irradiation: Secondary | ICD-10-CM

## 2015-01-07 LAB — CBC WITH DIFFERENTIAL/PLATELET
BASOS ABS: 0.1 10*3/uL (ref 0–0.1)
Basophils Relative: 1 %
Eosinophils Absolute: 0.2 10*3/uL (ref 0–0.7)
Eosinophils Relative: 4 %
HEMATOCRIT: 41.2 % (ref 35.0–47.0)
HEMOGLOBIN: 14.1 g/dL (ref 12.0–16.0)
Lymphocytes Relative: 25 %
Lymphs Abs: 1.5 10*3/uL (ref 1.0–3.6)
MCH: 31.9 pg (ref 26.0–34.0)
MCHC: 34.3 g/dL (ref 32.0–36.0)
MCV: 93.2 fL (ref 80.0–100.0)
Monocytes Absolute: 0.3 10*3/uL (ref 0.2–0.9)
Monocytes Relative: 5 %
Neutro Abs: 3.9 10*3/uL (ref 1.4–6.5)
Neutrophils Relative %: 65 %
Platelets: 185 10*3/uL (ref 150–440)
RBC: 4.42 MIL/uL (ref 3.80–5.20)
RDW: 13.3 % (ref 11.5–14.5)
WBC: 6.1 10*3/uL (ref 3.6–11.0)

## 2015-01-07 LAB — COMPREHENSIVE METABOLIC PANEL
ALBUMIN: 4.6 g/dL (ref 3.5–5.0)
ALK PHOS: 54 U/L (ref 38–126)
ALT: 18 U/L (ref 14–54)
ANION GAP: 7 (ref 5–15)
AST: 23 U/L (ref 15–41)
BILIRUBIN TOTAL: 0.5 mg/dL (ref 0.3–1.2)
BUN: 10 mg/dL (ref 6–20)
CHLORIDE: 100 mmol/L — AB (ref 101–111)
CO2: 27 mmol/L (ref 22–32)
Calcium: 8.9 mg/dL (ref 8.9–10.3)
Creatinine, Ser: 0.55 mg/dL (ref 0.44–1.00)
GFR calc Af Amer: >60 mL/min (ref >60)
GFR calc non Af Amer: >60 mL/min (ref >60)
Glucose, Bld: 118 mg/dL — ABNORMAL HIGH (ref 65–99)
POTASSIUM: 3.5 mmol/L (ref 3.5–5.1)
Sodium: 134 mmol/L — ABNORMAL LOW (ref 135–145)
Total Protein: 7.5 g/dL (ref 6.5–8.1)

## 2015-01-07 LAB — MAGNESIUM: Magnesium: 1.9 mg/dL (ref 1.7–2.4)

## 2015-01-07 MED ORDER — HEPARIN SOD (PORK) LOCK FLUSH 100 UNIT/ML IV SOLN
INTRAVENOUS | Status: AC
  Filled 2015-01-07: qty 5

## 2015-01-07 MED ORDER — EXEMESTANE 25 MG PO TABS: 25.0000 mg | ORAL_TABLET | Freq: Every day | ORAL | Status: DC

## 2015-01-07 MED ORDER — HEPARIN SOD (PORK) LOCK FLUSH 100 UNIT/ML IV SOLN
500.0000 [IU] | Freq: Once | INTRAVENOUS | Status: AC
  Administered 2015-01-07: 500 [IU] via INTRAVENOUS

## 2015-01-07 MED ORDER — TAMOXIFEN CITRATE 20 MG PO TABS
20.0000 mg | ORAL_TABLET | Freq: Every day | ORAL | Status: DC
Start: 2015-01-07 — End: 2015-08-21

## 2015-01-07 MED ORDER — SODIUM CHLORIDE 0.9 % IJ SOLN
10.0000 mL | INTRAMUSCULAR | Status: DC | PRN
  Administered 2015-01-07: 10 mL via INTRAVENOUS
  Filled 2015-01-07: qty 10

## 2015-01-07 NOTE — Telephone Encounter (Signed)
Cannot afford copay of $348 for Exsamestane, is there something else that can be tried?

## 2015-01-07 NOTE — Telephone Encounter (Signed)
Will escribe tamoxifen 20mg  daily to see if is any cheaper than exemestane.

## 2015-01-07 NOTE — Progress Notes (Signed)
[place] @ [facility] [phone]  Fax:([phone]) [place] OB: Aug 21, 1955  MR#: [id number]  WGN#:[id number]  Patient Care Team: Lorelee Market, MD as PCP - General (Family Medicine)  CHIEF COMPLAINT:  Chief Complaint  Patient presents with  . Follow-up  83. 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 Anastrozole 1 mg started in January of 2014 3.stopped ARIMIDEX  Ion July 11 th 2016 . be cause of bone pain 4.  Started on Aromasin from January 07, 2015     INTERVAL HISTORY: 59 year old lady with history of carcinoma breast came today further follow-up. Patient started having some problem with joint pains not able to walk not able to sleep.  Patient was taken off anastrozole followed that she has noticed significant improvement.  Patient was seen by primary care physician who thought there might be depression.  Patient is here for further follow-up and treatment consideration had a mammogram in May. Here to discuss possibility of second line of anti-hormonal treatment  REVIEW OF SYSTEMS:    general status: Patient is feeling weak and tired.  No change in a performance status.  No chills.  No fever. HEENT: .  No evidence of stomatitis Lungs: No cough or shortness of breath Cardiac: No chest pain or paroxysmal nocturnal dyspnea GI: No nausea no vomiting no diarrhea no abdominal pain Skin: No rash Lower extremity no swelling Neurological system: No tingling.  No numbness.  No other focal signs Musculoskeletal system patient had significant joint pain which is improved after anastrozole was discontinued.  Taking calcium and vitamin D.  As per HPI. Otherwise, a complete review of systems is negatve.  PAST MEDICAL HISTORY: Past Medical  History  Diagnosis Date  . Breast cancer 02/2012    left breast, radiation and chemotherapy    PAST SURGICAL HISTORY: Past Surgical History  Procedure Laterality Date  . Breast biopsy Left 12/2011    positive  . Breast excisional biopsy Left 02/2012    positive    FAMILY HISTORY Family History  Problem Relation Age of Onset  . Breast cancer Sister 58  . Breast cancer Maternal Grandmother 27    ADVANCED DIRECTIVES:  No flowsheet data found.  HEALTH MAINTENANCE: Social History  Substance Use Topics  . Smoking status: Former Research scientist (life sciences)  . Smokeless tobacco: Not on file  . Alcohol Use: Not on file      No Known Allergies  Current Outpatient Prescriptions  Medication Sig Dispense Refill  . amLODipine (NORVASC) 5 MG tablet TK 1 T PO QD  5  . hydrochlorothiazide (HYDRODIURIL) 25 MG tablet TK 1 T PO QD  5  . lisinopril (PRINIVIL,ZESTRIL) 10 MG tablet TK 1 T PO QD  5  . anastrozole (ARIMIDEX) 1 MG tablet TAKE 1 TABLET BY MOUTH EVERY DAY (Patient not taking: Reported on 01/07/2015) 30 tablet 0   No current facility-administered medications for this visit.    OBJECTIVE:  Filed Vitals:   01/07/15 0954  BP: 137/85  Pulse: 84  Temp: 96.7 F (35.9 C)     There is no height on file to calculate BMI.    ECOG FS:1 - Symptomatic but completely ambulatory  PHYSICAL EXAM: GENERAL:  Well developed, well nourished, sitting comfortably in the exam room in no acute distress. MENTAL STATUS:  Alert and oriented  to person, place and time.  ENT:  Oropharynx clear without lesion.  Tongue normal. Mucous membranes moist.  RESPIRATORY:  Clear to auscultation without rales, wheezes or rhonchi. CARDIOVASCULAR:  Regular rate and rhythm without murmur, rub or gallop. BREAST:  Right breast without masses, skin changes or nipple discharge.  Left breast without masses, skin changes or nipple discharge. ABDOMEN:  Soft, non-tender, with active bowel sounds, and no hepatosplenomegaly.  No  masses. BACK:  No CVA tenderness.  No tenderness on percussion of the back or rib cage. SKIN:  No rashes, ulcers or lesions. EXTREMITIES: No edema, no skin discoloration or tenderness.  No palpable cords. LYMPH NODES: No palpable cervical, supraclavicular, axillary or inguinal adenopathy  NEUROLOGICAL: Unremarkable. PSYCH:  Appropriate.   LAB RESULTS:  No visits with results within 2 Day(s) from this visit. Latest known visit with results is:  Hospital Outpatient Visit on 08/27/2014  Component Date Value Ref Range Status  . CA 27.29 08/27/2014 12.9  0.0-38.6 U/mL Final   Comment:  Research scientist (life sciences)            [facility]            No: [id number]           [address], [place], [place] [address]           Lindon Romp, MD         [phone] Result(s) reported on 28 Aug 2014 at 02:19PM.   . WBC 08/27/2014 5.5  3.6-11.0 x10 3/mm  Final  . RBC 08/27/2014 4.35  3.80-5.20 x10 6/mm  Final  . HGB 08/27/2014 14.0  12.0-16.0 g/dL Final  . HCT 08/27/2014 40.9  35.0-47.0 % Final  . MCV 08/27/2014 94  80-100 fL Final  . MCH 08/27/2014 32.2  26.0-34.0 pg Final  . MCHC 08/27/2014 34.3  32.0-36.0 g/dL Final  . RDW 08/27/2014 13.5  11.5-14.5 % Final  . Platelet 08/27/2014 194  150-440 x10 3/mm  Final  . Neutrophil % 08/27/2014 63.4   Final  . Lymphocyte % 08/27/2014 26.7   Final  . Monocyte % 08/27/2014 5.3   Final  . Eosinophil % 08/27/2014 4.0   Final  . Basophil % 08/27/2014 0.6   Final  . Neutrophil # 08/27/2014 3.5  1.4-6.5 x10 3/mm  Final  . Lymphocyte # 08/27/2014 1.5  1.0-3.6 x10 3/mm  Final  . Monocyte # 08/27/2014 0.3  0.2-0.9 x10 3/mm  Final  . Eosinophil # 08/27/2014 0.2  0.0-0.7 x10 3/mm  Final  . Basophil # 08/27/2014 0.0  0.0-0.1 x10 3/mm  Final  . Glucose, CSF 08/27/2014 DNP   Corrected   Comment: 65-99 NOTE: New Reference Range  08/05/14   . BUN 08/27/2014 13   Final   Comment: 6-20 NOTE: New Reference Range  08/05/14   .  Creatinine 08/27/2014 0.58   Final   Comment: 0.44-1.00 NOTE: New Reference Range  08/05/14   . Sodium, Urine Random 08/27/2014 DNP   Corrected   Comment: 135-145 NOTE: New Reference Range  08/05/14   . Potassium, Urine Random 08/27/2014 DNP   Corrected   Comment: 3.5-5.1 NOTE: New Reference Range  08/05/14   . Chloride, Urine Random 08/27/2014 DNP   Corrected   Comment: 101-111 NOTE: New Reference Range  08/05/14   . Co2 08/27/2014 25   Final   Comment: 22-32 NOTE: New Reference Range  08/05/14   . Calcium, Total 08/27/2014 9.4   Final   Comment: 8.9-10.3 NOTE: New  Reference Range  08/05/14   . SGOT(AST) 08/27/2014 24   Final   Comment: 15-41 NOTE: New Reference Range  08/05/14   . SGPT (ALT) 08/27/2014 19   Final   Comment: 14-54 NOTE: New Reference Range  08/05/14   . Alkaline Phosphatase 08/27/2014 53   Final   Comment: 38-126 NOTE: New Reference Range  08/05/14   . Albumin 08/27/2014 4.7   Final   Comment: 3.5-5.0 NOTE: New reference range  08/05/14   . Total Protein 08/27/2014 7.5   Final   Comment: 6.5-8.1 NOTE: New Reference Range  08/05/14   . Bilirubin,Total 08/27/2014 0.6   Final   Comment: 0.3-1.2 NOTE: New Reference Range  08/05/14   . Anion Gap 08/27/2014 8  7-16 Final  . EGFR (African American) 08/27/2014 >60   Final  . EGFR (Non-African Amer.) 08/27/2014 >60   Final   Comment: eGFR values <38m/min/1.73 m2 may be an indication of chronic kidney disease (CKD). Calculated eGFR is useful in patients with stable renal function. The eGFR calculation will not be reliable in acutely ill patients when serum creatinine is changing rapidly. It is not useful in patients on dialysis. The eGFR calculation may not be applicable to patients at the low and high extremes of body sizes, pregnant women, and vegetarians.   . Glucose 08/27/2014 140*  Final   Comment: 65-99 NOTE: New Reference Range  08/05/14   . Sodium 08/27/2014 134*  Final    Comment: 135-145 NOTE: New Reference Range  08/05/14   . Potassium 08/27/2014 3.3*  Final   Comment: 3.5-5.1 NOTE: New Reference Range  08/05/14   . Chloride 08/27/2014 101   Final   Comment: 101-111 NOTE: New Reference Range  08/05/14    lab data is pending   ASSESSMENT: Carcinoma breast patient is on anti-hormonal therapy Anastrozole was discontinued because of significant joint pains Started on Aromasin Consider bone density study during next mammogram  Patient was advised to call me if she has any significant problem with Aromasin  Patient expressed understanding and was in agreement with this plan. She also understands that She can call clinic at any time with any questions, concerns, or complaints.    No matching staging information was found for the patient.  JForest Gleason MD   01/07/2015 10:14 AM

## 2015-01-07 NOTE — Progress Notes (Signed)
Patient does not have living will.  Declined information.  Former smoker.  Patient here today for unrelenting fatigue.  Had an episode in [place] where she felt she could not move. States she has to do a little something and sit down.  Saw Dr. Brunetta Genera who did blood work but she has not heard from it.

## 2015-01-08 ENCOUNTER — Ambulatory Visit: Payer: Medicare Other | Admitting: Oncology

## 2015-01-08 LAB — CANCER ANTIGEN 27.29: CA 27.29: 7.8 U/mL (ref 0.0–38.6)

## 2015-02-20 ENCOUNTER — Other Ambulatory Visit: Payer: Self-pay | Admitting: *Deleted

## 2015-02-20 DIAGNOSIS — C50919 Malignant neoplasm of unspecified site of unspecified female breast: Secondary | ICD-10-CM

## 2015-02-25 ENCOUNTER — Inpatient Hospital Stay: Payer: Medicare Other

## 2015-02-25 ENCOUNTER — Inpatient Hospital Stay (HOSPITAL_BASED_OUTPATIENT_CLINIC_OR_DEPARTMENT_OTHER): Payer: Medicare Other | Admitting: Oncology

## 2015-02-25 ENCOUNTER — Encounter: Payer: Self-pay | Admitting: Oncology

## 2015-02-25 ENCOUNTER — Inpatient Hospital Stay: Payer: Medicare Other | Attending: Oncology

## 2015-02-25 VITALS — BP 119/83 | HR 69 | Temp 97.2°F | Wt 179.5 lb

## 2015-02-25 DIAGNOSIS — C801 Malignant (primary) neoplasm, unspecified: Secondary | ICD-10-CM

## 2015-02-25 DIAGNOSIS — Z23 Encounter for immunization: Secondary | ICD-10-CM | POA: Diagnosis not present

## 2015-02-25 DIAGNOSIS — Z79899 Other long term (current) drug therapy: Secondary | ICD-10-CM | POA: Insufficient documentation

## 2015-02-25 DIAGNOSIS — C50919 Malignant neoplasm of unspecified site of unspecified female breast: Secondary | ICD-10-CM

## 2015-02-25 DIAGNOSIS — Z9221 Personal history of antineoplastic chemotherapy: Secondary | ICD-10-CM

## 2015-02-25 DIAGNOSIS — Z87891 Personal history of nicotine dependence: Secondary | ICD-10-CM | POA: Diagnosis not present

## 2015-02-25 DIAGNOSIS — Z79810 Long term (current) use of selective estrogen receptor modulators (SERMs): Secondary | ICD-10-CM

## 2015-02-25 DIAGNOSIS — Z170 Estrogen receptor positive status [ER+]: Secondary | ICD-10-CM

## 2015-02-25 DIAGNOSIS — Z923 Personal history of irradiation: Secondary | ICD-10-CM

## 2015-02-25 DIAGNOSIS — C50912 Malignant neoplasm of unspecified site of left female breast: Secondary | ICD-10-CM | POA: Diagnosis not present

## 2015-02-25 DIAGNOSIS — Z803 Family history of malignant neoplasm of breast: Secondary | ICD-10-CM | POA: Insufficient documentation

## 2015-02-25 LAB — CBC WITH DIFFERENTIAL/PLATELET
BASOS PCT: 1 %
Basophils Absolute: 0 10*3/uL (ref 0–0.1)
EOS ABS: 0.2 10*3/uL (ref 0–0.7)
EOS PCT: 3 %
HCT: 40.1 % (ref 35.0–47.0)
Hemoglobin: 13.6 g/dL (ref 12.0–16.0)
LYMPHS ABS: 1.8 10*3/uL (ref 1.0–3.6)
Lymphocytes Relative: 27 %
MCH: 31.9 pg (ref 26.0–34.0)
MCHC: 33.9 g/dL (ref 32.0–36.0)
MCV: 94 fL (ref 80.0–100.0)
Monocytes Absolute: 0.3 10*3/uL (ref 0.2–0.9)
Monocytes Relative: 5 %
Neutro Abs: 4.3 10*3/uL (ref 1.4–6.5)
Neutrophils Relative %: 64 %
PLATELETS: 176 10*3/uL (ref 150–440)
RBC: 4.26 MIL/uL (ref 3.80–5.20)
RDW: 12.9 % (ref 11.5–14.5)
WBC: 6.7 10*3/uL (ref 3.6–11.0)

## 2015-02-25 LAB — COMPREHENSIVE METABOLIC PANEL
ALT: 16 U/L (ref 14–54)
ANION GAP: 8 (ref 5–15)
AST: 26 U/L (ref 15–41)
Albumin: 4.3 g/dL (ref 3.5–5.0)
Alkaline Phosphatase: 43 U/L (ref 38–126)
BUN: 8 mg/dL (ref 6–20)
CHLORIDE: 100 mmol/L — AB (ref 101–111)
CO2: 26 mmol/L (ref 22–32)
Calcium: 8.4 mg/dL — ABNORMAL LOW (ref 8.9–10.3)
Creatinine, Ser: 0.6 mg/dL (ref 0.44–1.00)
GFR calc Af Amer: >60 mL/min (ref >60)
GFR calc non Af Amer: >60 mL/min (ref >60)
Glucose, Bld: 99 mg/dL (ref 65–99)
Potassium: 3.4 mmol/L — ABNORMAL LOW (ref 3.5–5.1)
SODIUM: 134 mmol/L — AB (ref 135–145)
Total Bilirubin: 0.6 mg/dL (ref 0.3–1.2)
Total Protein: 7.1 g/dL (ref 6.5–8.1)

## 2015-02-25 MED ORDER — INFLUENZA VAC SPLIT QUAD 0.5 ML IM SUSY
0.5000 mL | PREFILLED_SYRINGE | Freq: Once | INTRAMUSCULAR | Status: AC
  Administered 2015-02-25: 0.5 mL via INTRAMUSCULAR

## 2015-02-25 MED ORDER — HEPARIN SOD (PORK) LOCK FLUSH 100 UNIT/ML IV SOLN
500.0000 [IU] | Freq: Once | INTRAVENOUS | Status: AC
  Administered 2015-02-25: 500 [IU] via INTRAVENOUS

## 2015-02-25 MED ORDER — SODIUM CHLORIDE 0.9 % IJ SOLN
10.0000 mL | Freq: Once | INTRAMUSCULAR | Status: AC
  Administered 2015-02-25: 10 mL via INTRAVENOUS
  Filled 2015-02-25: qty 10

## 2015-02-25 NOTE — Progress Notes (Signed)
Patient does not have living will.  Former smoker.  Patient states she is feeling so much better since changing her medication to Tamoxifen.

## 2015-02-26 ENCOUNTER — Encounter: Payer: Self-pay | Admitting: Oncology

## 2015-02-26 LAB — CANCER ANTIGEN 27.29: CA 27.29: 6.5 U/mL (ref 0.0–38.6)

## 2015-02-26 NOTE — Progress Notes (Signed)
[place] @ [facility] Telephone:(336) [phone]  Fax:(336) [place] OB: [birth year]  MR#: [id number]  WUX#:[id number]  Patient Care Team: Lorelee Market, MD as PCP - General (Family Medicine)  CHIEF COMPLAINT:  Chief Complaint  Patient presents with  . OTHER  48. 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 Anastrozole 1 mg started in January of 2014 3.stopped ARIMIDEX  Ion July 11 th 2016 . be cause of bone pain 4.  Started on Aromasin from January 07, 2015 5.  Because of side effect of Aromasin patient has been started on tamoxifen (August, 2016)     INTERVAL HISTORY: 59 year old lady with history of carcinoma breast came today further follow-up. Patient started having some problem with joint pains not able to walk not able to sleep.  Patient was taken off anastrozole followed that she has noticed significant improvement.general status: Patient is feeling weak and tired.  No change in a performance status.  No chills.  No fever. Patient is tolerating tamoxifen very well without any significant bony pains.  Patient had a previous history of hysterectomy.  Having some hot flashes night sweats Review of system .  No bony pains.  Appetite has been stable.  Patient is having hot flashes night sweats.  Some breast tenderness.  All 12 systems have been reviewed.  Bony pains have significantly improved.  As per HPI. Otherwise, a complete review of systems is negatve.  PAST MEDICAL HISTORY: Past Medical History  Diagnosis Date  . Breast cancer 02/2012    left breast, radiation and chemotherapy    PAST SURGICAL HISTORY: Past Surgical History  Procedure Laterality Date  . Breast biopsy Left 12/2011    positive  . Breast excisional biopsy Left  02/2012    positive    FAMILY HISTORY Family History  Problem Relation Age of Onset  . Breast cancer Sister 59  . Breast cancer Maternal Grandmother 59    ADVANCED DIRECTIVES:  Patient does not have any living will or healthcare power of attorney.  Information was given .  Available resources had been discussed.  We will follow-up on subsequent appointments regarding this issue HEALTH MAINTENANCE: Social History  Substance Use Topics  . Smoking status: Former Research scientist (life sciences)  . Smokeless tobacco: None  . Alcohol Use: None      No Known Allergies  Current Outpatient Prescriptions  Medication Sig Dispense Refill  . amLODipine (NORVASC) 5 MG tablet TK 1 T PO QD  5  . hydrochlorothiazide (HYDRODIURIL) 25 MG tablet TK 1 T PO QD  5  . lisinopril (PRINIVIL,ZESTRIL) 10 MG tablet TK 1 T PO QD  5  . tamoxifen (NOLVADEX) 20 MG tablet Take 1 tablet (20 mg total) by mouth daily. 30 tablet 6   No current facility-administered medications for this visit.    OBJECTIVE:  Filed Vitals:   02/25/15 1136  BP: 119/83  Pulse: 69  Temp: 97.2 F (36.2 C)     There is no height on file to calculate BMI.    ECOG FS:1 - Symptomatic but completely ambulatory  PHYSICAL EXAM: GENERAL:  Well developed, well nourished, sitting comfortably in the exam room in no acute distress. MENTAL STATUS:  Alert and oriented to person, place and time.  ENT:  Oropharynx clear without lesion.  Tongue normal. Mucous membranes moist.  RESPIRATORY:  Clear to auscultation without rales, wheezes or rhonchi. CARDIOVASCULAR:  Regular rate and rhythm without murmur, rub or gallop. BREAST:  Right breast without masses, skin changes or nipple discharge.  Left breast without masses, skin changes or nipple discharge. ABDOMEN:  Soft, non-tender, with active bowel sounds, and no hepatosplenomegaly.  No masses. BACK:  No CVA tenderness.  No tenderness on percussion of the back or rib cage. SKIN:  No rashes, ulcers or  lesions. EXTREMITIES: No edema, no skin discoloration or tenderness.  No palpable cords. LYMPH NODES: No palpable cervical, supraclavicular, axillary or inguinal adenopathy  NEUROLOGICAL: Unremarkable. PSYCH:  Appropriate.   LAB RESULTS:  Infusion on 02/25/2015  Component Date Value Ref Range Status  . WBC 02/25/2015 6.7  3.6 - 11.0 K/uL Final   A-LINE DRAW  . RBC 02/25/2015 4.26  3.80 - 5.20 MIL/uL Final  . Hemoglobin 02/25/2015 13.6  12.0 - 16.0 g/dL Final  . HCT 02/25/2015 40.1  35.0 - 47.0 % Final  . MCV 02/25/2015 94.0  80.0 - 100.0 fL Final  . MCH 02/25/2015 31.9  26.0 - 34.0 pg Final  . MCHC 02/25/2015 33.9  32.0 - 36.0 g/dL Final  . RDW 02/25/2015 12.9  11.5 - 14.5 % Final  . Platelets 02/25/2015 176  150 - 440 K/uL Final  . Neutrophils Relative % 02/25/2015 64   Final  . Neutro Abs 02/25/2015 4.3  1.4 - 6.5 K/uL Final  . Lymphocytes Relative 02/25/2015 27   Final  . Lymphs Abs 02/25/2015 1.8  1.0 - 3.6 K/uL Final  . Monocytes Relative 02/25/2015 5   Final  . Monocytes Absolute 02/25/2015 0.3  0.2 - 0.9 K/uL Final  . Eosinophils Relative 02/25/2015 3   Final  . Eosinophils Absolute 02/25/2015 0.2  0 - 0.7 K/uL Final  . Basophils Relative 02/25/2015 1   Final  . Basophils Absolute 02/25/2015 0.0  0 - 0.1 K/uL Final  . Sodium 02/25/2015 134* 135 - 145 mmol/L Final  . Potassium 02/25/2015 3.4* 3.5 - 5.1 mmol/L Final  . Chloride 02/25/2015 100* 101 - 111 mmol/L Final  . CO2 02/25/2015 26  22 - 32 mmol/L Final  . Glucose, Bld 02/25/2015 99  65 - 99 mg/dL Final  . BUN 02/25/2015 8  6 - 20 mg/dL Final  . Creatinine, Ser 02/25/2015 0.60  0.44 - 1.00 mg/dL Final  . Calcium 02/25/2015 8.4* 8.9 - 10.3 mg/dL Final  . Total Protein 02/25/2015 7.1  6.5 - 8.1 g/dL Final  . Albumin 02/25/2015 4.3  3.5 - 5.0 g/dL Final  . AST 02/25/2015 26  15 - 41 U/L Final  . ALT 02/25/2015 16  14 - 54 U/L Final  . Alkaline Phosphatase 02/25/2015 43  38 - 126 U/L Final  . Total Bilirubin  02/25/2015 0.6  0.3 - 1.2 mg/dL Final  . GFR calc non Af Amer 02/25/2015 >60  >60 mL/min Final  . GFR calc Af Amer 02/25/2015 >60  >60 mL/min Final   Comment: (NOTE) The eGFR has been calculated using the CKD EPI equation. This calculation has not been validated in all clinical situations. eGFR's persistently <60 mL/min signify possible Chronic Kidney Disease.   . Anion gap 02/25/2015 8  5 - 15 Final  . CA 27.29 02/25/2015 6.5  0.0 - 38.6 U/mL Final   Comment: (NOTE) [employer]/ACS methodology Performed At: [facility] [place], [place] [id number] Lindon Romp MD XJ:[id number]    all lab data has  been reviewed   ASSESSMENT: Carcinoma breast patient is on anti-hormonal therapy Anastrozole was discontinued because of significant joint pains Patient is taking tamoxifen Bone density study has been done and has been reviewed independently.  No evidence of significant nostril proptosis. Continue tamoxifen which is causing at present time our classes and night sweats but patient's a that she is tolerating well and does not want to change any treatment at present time Proceed  with next mammogram Proceed with flu vaccine today  Patient expressed understanding and was in agreement with this plan. She also understands that She can call clinic at any time with any questions, concerns, or complaints.    No matching staging information was found for the patient.  Forest Gleason, MD   02/26/2015 1:02 PM

## 2015-03-03 ENCOUNTER — Telehealth: Payer: Self-pay | Admitting: *Deleted

## 2015-03-03 DIAGNOSIS — C50919 Malignant neoplasm of unspecified site of unspecified female breast: Secondary | ICD-10-CM

## 2015-03-03 MED ORDER — HYDROCHLOROTHIAZIDE 25 MG PO TABS: 25.0000 mg | ORAL_TABLET | Freq: Every day | ORAL | Status: DC

## 2015-03-03 NOTE — Telephone Encounter (Signed)
Escribed

## 2015-04-20 DIAGNOSIS — E559 Vitamin D deficiency, unspecified: Secondary | ICD-10-CM | POA: Diagnosis not present

## 2015-04-20 DIAGNOSIS — Z716 Tobacco abuse counseling: Secondary | ICD-10-CM | POA: Diagnosis not present

## 2015-04-20 DIAGNOSIS — Z0000 Encounter for general adult medical examination without abnormal findings: Secondary | ICD-10-CM | POA: Diagnosis not present

## 2015-04-20 DIAGNOSIS — C50912 Malignant neoplasm of unspecified site of left female breast: Secondary | ICD-10-CM | POA: Diagnosis not present

## 2015-04-20 DIAGNOSIS — R739 Hyperglycemia, unspecified: Secondary | ICD-10-CM | POA: Diagnosis not present

## 2015-04-20 DIAGNOSIS — I10 Essential (primary) hypertension: Secondary | ICD-10-CM | POA: Diagnosis not present

## 2015-04-20 DIAGNOSIS — E7800 Pure hypercholesterolemia, unspecified: Secondary | ICD-10-CM | POA: Diagnosis not present

## 2015-04-20 DIAGNOSIS — G629 Polyneuropathy, unspecified: Secondary | ICD-10-CM | POA: Diagnosis not present

## 2015-04-20 DIAGNOSIS — R5383 Other fatigue: Secondary | ICD-10-CM | POA: Diagnosis not present

## 2015-05-27 ENCOUNTER — Telehealth: Payer: Self-pay | Admitting: Oncology

## 2015-05-27 NOTE — Telephone Encounter (Signed)
She is experiencing unintended weight loss and a general feeling of being unwell. She feels she probably needs to see [facility]. Please advise and please call her at [phone].

## 2015-05-28 NOTE — Telephone Encounter (Signed)
Please schedule pt to see md in 2 weeks with labs.

## 2015-05-29 ENCOUNTER — Inpatient Hospital Stay: Payer: Medicare Other

## 2015-06-02 ENCOUNTER — Other Ambulatory Visit: Payer: Self-pay | Admitting: *Deleted

## 2015-06-04 ENCOUNTER — Inpatient Hospital Stay: Payer: Medicare Other | Attending: Oncology

## 2015-06-04 DIAGNOSIS — R5383 Other fatigue: Secondary | ICD-10-CM | POA: Diagnosis not present

## 2015-06-04 DIAGNOSIS — M791 Myalgia: Secondary | ICD-10-CM | POA: Insufficient documentation

## 2015-06-04 DIAGNOSIS — Z79810 Long term (current) use of selective estrogen receptor modulators (SERMs): Secondary | ICD-10-CM | POA: Insufficient documentation

## 2015-06-04 DIAGNOSIS — Z803 Family history of malignant neoplasm of breast: Secondary | ICD-10-CM | POA: Insufficient documentation

## 2015-06-04 DIAGNOSIS — Z87891 Personal history of nicotine dependence: Secondary | ICD-10-CM | POA: Insufficient documentation

## 2015-06-04 DIAGNOSIS — M2550 Pain in unspecified joint: Secondary | ICD-10-CM | POA: Insufficient documentation

## 2015-06-04 DIAGNOSIS — R531 Weakness: Secondary | ICD-10-CM | POA: Insufficient documentation

## 2015-06-04 DIAGNOSIS — R110 Nausea: Secondary | ICD-10-CM | POA: Insufficient documentation

## 2015-06-04 DIAGNOSIS — C50919 Malignant neoplasm of unspecified site of unspecified female breast: Secondary | ICD-10-CM

## 2015-06-04 DIAGNOSIS — Z9221 Personal history of antineoplastic chemotherapy: Secondary | ICD-10-CM | POA: Insufficient documentation

## 2015-06-04 DIAGNOSIS — G4730 Sleep apnea, unspecified: Secondary | ICD-10-CM | POA: Diagnosis not present

## 2015-06-04 DIAGNOSIS — G629 Polyneuropathy, unspecified: Secondary | ICD-10-CM | POA: Diagnosis not present

## 2015-06-04 DIAGNOSIS — R634 Abnormal weight loss: Secondary | ICD-10-CM | POA: Diagnosis not present

## 2015-06-04 DIAGNOSIS — F329 Major depressive disorder, single episode, unspecified: Secondary | ICD-10-CM | POA: Diagnosis not present

## 2015-06-04 DIAGNOSIS — R630 Anorexia: Secondary | ICD-10-CM | POA: Insufficient documentation

## 2015-06-04 DIAGNOSIS — C50912 Malignant neoplasm of unspecified site of left female breast: Secondary | ICD-10-CM | POA: Insufficient documentation

## 2015-06-04 DIAGNOSIS — Z170 Estrogen receptor positive status [ER+]: Secondary | ICD-10-CM | POA: Insufficient documentation

## 2015-06-04 DIAGNOSIS — Z79899 Other long term (current) drug therapy: Secondary | ICD-10-CM | POA: Diagnosis not present

## 2015-06-04 MED ORDER — SODIUM CHLORIDE 0.9 % IJ SOLN
10.0000 mL | Freq: Once | INTRAMUSCULAR | Status: AC
  Administered 2015-06-04: 10 mL via INTRAVENOUS
  Filled 2015-06-04: qty 10

## 2015-06-04 MED ORDER — HEPARIN SOD (PORK) LOCK FLUSH 100 UNIT/ML IV SOLN
500.0000 [IU] | Freq: Once | INTRAVENOUS | Status: AC
Start: 2015-06-04 — End: 2015-06-04
  Administered 2015-06-04: 500 [IU] via INTRAVENOUS
  Filled 2015-06-04: qty 5

## 2015-06-11 ENCOUNTER — Inpatient Hospital Stay (HOSPITAL_BASED_OUTPATIENT_CLINIC_OR_DEPARTMENT_OTHER): Payer: Medicare Other | Admitting: Oncology

## 2015-06-11 ENCOUNTER — Encounter: Payer: Self-pay | Admitting: Oncology

## 2015-06-11 ENCOUNTER — Inpatient Hospital Stay: Payer: Medicare Other

## 2015-06-11 VITALS — BP 132/90 | HR 68 | Temp 97.1°F | Resp 18 | Wt 172.8 lb

## 2015-06-11 DIAGNOSIS — C50919 Malignant neoplasm of unspecified site of unspecified female breast: Secondary | ICD-10-CM

## 2015-06-11 DIAGNOSIS — M791 Myalgia: Secondary | ICD-10-CM

## 2015-06-11 DIAGNOSIS — Z79810 Long term (current) use of selective estrogen receptor modulators (SERMs): Secondary | ICD-10-CM

## 2015-06-11 DIAGNOSIS — R5383 Other fatigue: Secondary | ICD-10-CM | POA: Diagnosis not present

## 2015-06-11 DIAGNOSIS — R531 Weakness: Secondary | ICD-10-CM

## 2015-06-11 DIAGNOSIS — Z803 Family history of malignant neoplasm of breast: Secondary | ICD-10-CM

## 2015-06-11 DIAGNOSIS — Z170 Estrogen receptor positive status [ER+]: Secondary | ICD-10-CM

## 2015-06-11 DIAGNOSIS — F329 Major depressive disorder, single episode, unspecified: Secondary | ICD-10-CM | POA: Diagnosis not present

## 2015-06-11 DIAGNOSIS — R634 Abnormal weight loss: Secondary | ICD-10-CM

## 2015-06-11 DIAGNOSIS — Z9221 Personal history of antineoplastic chemotherapy: Secondary | ICD-10-CM | POA: Diagnosis not present

## 2015-06-11 DIAGNOSIS — M2550 Pain in unspecified joint: Secondary | ICD-10-CM

## 2015-06-11 DIAGNOSIS — G629 Polyneuropathy, unspecified: Secondary | ICD-10-CM

## 2015-06-11 DIAGNOSIS — R630 Anorexia: Secondary | ICD-10-CM

## 2015-06-11 DIAGNOSIS — Z87891 Personal history of nicotine dependence: Secondary | ICD-10-CM

## 2015-06-11 DIAGNOSIS — C50912 Malignant neoplasm of unspecified site of left female breast: Secondary | ICD-10-CM

## 2015-06-11 DIAGNOSIS — G4730 Sleep apnea, unspecified: Secondary | ICD-10-CM | POA: Diagnosis not present

## 2015-06-11 DIAGNOSIS — Z79899 Other long term (current) drug therapy: Secondary | ICD-10-CM

## 2015-06-11 DIAGNOSIS — R110 Nausea: Secondary | ICD-10-CM

## 2015-06-11 LAB — COMPREHENSIVE METABOLIC PANEL
ALBUMIN: 4.5 g/dL (ref 3.5–5.0)
ALT: 15 U/L (ref 14–54)
AST: 18 U/L (ref 15–41)
Alkaline Phosphatase: 41 U/L (ref 38–126)
Anion gap: 6 (ref 5–15)
BILIRUBIN TOTAL: 0.6 mg/dL (ref 0.3–1.2)
BUN: 13 mg/dL (ref 6–20)
CO2: 28 mmol/L (ref 22–32)
Calcium: 9.4 mg/dL (ref 8.9–10.3)
Chloride: 100 mmol/L — ABNORMAL LOW (ref 101–111)
Creatinine, Ser: 0.63 mg/dL (ref 0.44–1.00)
GFR calc Af Amer: >60 mL/min (ref >60)
GFR calc non Af Amer: >60 mL/min (ref >60)
GLUCOSE: 124 mg/dL — AB (ref 65–99)
POTASSIUM: 3.7 mmol/L (ref 3.5–5.1)
Sodium: 134 mmol/L — ABNORMAL LOW (ref 135–145)
TOTAL PROTEIN: 7.3 g/dL (ref 6.5–8.1)

## 2015-06-11 LAB — CBC WITH DIFFERENTIAL/PLATELET
BASOS ABS: 0 10*3/uL (ref 0–0.1)
BASOS PCT: 0 %
EOS ABS: 0.3 10*3/uL (ref 0–0.7)
Eosinophils Relative: 4 %
HEMATOCRIT: 42.6 % (ref 35.0–47.0)
Hemoglobin: 14.4 g/dL (ref 12.0–16.0)
Lymphocytes Relative: 25 %
Lymphs Abs: 1.6 10*3/uL (ref 1.0–3.6)
MCH: 31.9 pg (ref 26.0–34.0)
MCHC: 33.8 g/dL (ref 32.0–36.0)
MCV: 94.3 fL (ref 80.0–100.0)
MONO ABS: 0.4 10*3/uL (ref 0.2–0.9)
Monocytes Relative: 6 %
NEUTROS ABS: 4.1 10*3/uL (ref 1.4–6.5)
Neutrophils Relative %: 65 %
Platelets: 183 10*3/uL (ref 150–440)
RBC: 4.52 MIL/uL (ref 3.80–5.20)
RDW: 12.7 % (ref 11.5–14.5)
WBC: 6.4 10*3/uL (ref 3.6–11.0)

## 2015-06-11 NOTE — Progress Notes (Signed)
Patient complains of feeling fatigued.  She is nauseated, appetite declining and losing weight.  States she has muscle and joint pain.  By the end of the day she is "worn out". Patient continues to have neuropathy.

## 2015-06-11 NOTE — Progress Notes (Signed)
[place] @ [facility] [phone]  Fax:(336) [place] OB: 10-21-1955  MR#: [id number]  NOM#:[id number]  Patient Care Team: Lorelee Market, MD as PCP - General (Family Medicine)  CHIEF COMPLAINT:  Chief Complaint  Patient presents with  . Breast Cancer  39. 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 Anastrozole 1 mg started in January of 2014 3.stopped ARIMIDEX  Ion July 11 th 2016 . be cause of bone pain 4.  Started on Aromasin from January 07, 2015 5.  Because of side effect of Aromasin patient has been started on tamoxifen (August, 2016)     INTERVAL HISTORY: 60 year old lady with history of carcinoma breast came today further follow-up. Patient started having some problem with joint pains not able to walk not able to sleep.  Patient was taken off anastrozole followed that she has noticed significant improvement.general status: Patient is feeling weak and tired.  No change in a performance status.  No chills.  No fever. Patient complains of feeling fatigued. She is nauseated, appetite declining and losing weight. States she has muscle and joint pain. By the end of the day she is "worn out". Patient continues to have neuropathy Patient complains of 10 pound of weight loss since last August. Normalize weakness.  Review of system GENERAL: Feels somewhat weak tired.  Has lost approximately 10 pounds of weight since last August.  Poor appetite. PERFORMANCE STATUS (ECOG)01 HEENT:  No visual changes, runny nose, sore throat, mouth sores or tenderness. Lungs: No shortness of breath or cough.  No hemoptysis. Cardiac:  No chest pain, palpitations, orthopnea, or PND. GI:  No nausea, vomiting, diarrhea, constipation, melena or  hematochezia. GU:  No urgency, frequency, dysuria, or hematuria. Musculoskeletal:  Muscle pains.  Aches and pains. Extremities:  No pain or swelling. Skin:  No rashes or skin changes. Neuro: Neuropathy in lower extremity is getting worse Endocrine:  No diabetes, thyroid issues, hot flashes or night sweats. Psych:  No mood changes, depression or anxiety. Pain:  No focal pain. Review of systems:  All other systems reviewed and found to be negative.  As per HPI. Otherwise, a complete review of systems is negatve.  PAST MEDICAL HISTORY: Past Medical History  Diagnosis Date  . Breast cancer ([place]) 02/2012    left breast, radiation and chemotherapy    PAST SURGICAL HISTORY: Past Surgical History  Procedure Laterality Date  . Breast biopsy Left 12/2011    positive  . Breast excisional biopsy Left 02/2012    positive    FAMILY HISTORY Family History  Problem Relation Age of Onset  . Breast cancer Sister 57  . Breast cancer Maternal Grandmother 18    ADVANCED DIRECTIVES:  Patient does not have any living will or healthcare power of attorney.  Information was given .  Available resources had been discussed.  We will follow-up on subsequent appointments regarding this issue HEALTH MAINTENANCE: Social History  Substance Use Topics  . Smoking status: Former Research scientist (life sciences)  . Smokeless tobacco: None  . Alcohol Use: None      No Known Allergies  Current Outpatient Prescriptions  Medication Sig Dispense Refill  . amLODipine (NORVASC) 5 MG tablet TK 1 T PO QD  5  . hydrochlorothiazide (HYDRODIURIL) 25 MG tablet Take 1 tablet (25 mg total) by mouth daily.  30 tablet 5  . lisinopril (PRINIVIL,ZESTRIL) 10 MG tablet TK 1 T PO QD  5  . tamoxifen (NOLVADEX) 20 MG tablet Take 1 tablet (20 mg total) by mouth daily. 30 tablet 6   No current facility-administered medications for this visit.    OBJECTIVE:  Filed Vitals:   06/11/15 0907  BP: 132/90  Pulse: 68  Temp: 97.1 F (36.2 C)  Resp:  18     There is no height on file to calculate BMI.    ECOG FS:1 - Symptomatic but completely ambulatory  PHYSICAL EXAM: GENERAL:  Well developed, well nourished, sitting comfortably in the exam room in no acute distress. MENTAL STATUS:  Alert and oriented to person, place and time.  ENT:  Oropharynx clear without lesion.  Tongue normal. Mucous membranes moist.  RESPIRATORY:  Clear to auscultation without rales, wheezes or rhonchi. CARDIOVASCULAR:  Regular rate and rhythm without murmur, rub or gallop. BREAST:  Right breast without masses, skin changes or nipple discharge.  Left breast without masses, skin changes or nipple discharge. ABDOMEN:  Soft, non-tender, with active bowel sounds, and no hepatosplenomegaly.  No masses. BACK:  No CVA tenderness.  No tenderness on percussion of the back or rib cage. SKIN:  No rashes, ulcers or lesions. EXTREMITIES: No edema, no skin discoloration or tenderness.  No palpable cords. LYMPH NODES: No palpable cervical, supraclavicular, axillary or inguinal adenopathy  NEUROLOGICAL: Unremarkable. PSYCH:  Appropriate.   LAB RESULTS:  Appointment on 06/11/2015  Component Date Value Ref Range Status  . WBC 06/11/2015 6.4  3.6 - 11.0 K/uL Final  . RBC 06/11/2015 4.52  3.80 - 5.20 MIL/uL Final  . Hemoglobin 06/11/2015 14.4  12.0 - 16.0 g/dL Final  . HCT 06/11/2015 42.6  35.0 - 47.0 % Final  . MCV 06/11/2015 94.3  80.0 - 100.0 fL Final  . MCH 06/11/2015 31.9  26.0 - 34.0 pg Final  . MCHC 06/11/2015 33.8  32.0 - 36.0 g/dL Final  . RDW 06/11/2015 12.7  11.5 - 14.5 % Final  . Platelets 06/11/2015 183  150 - 440 K/uL Final  . Neutrophils Relative % 06/11/2015 65   Final  . Neutro Abs 06/11/2015 4.1  1.4 - 6.5 K/uL Final  . Lymphocytes Relative 06/11/2015 25   Final  . Lymphs Abs 06/11/2015 1.6  1.0 - 3.6 K/uL Final  . Monocytes Relative 06/11/2015 6   Final  . Monocytes Absolute 06/11/2015 0.4  0.2 - 0.9 K/uL Final  . Eosinophils Relative 06/11/2015 4    Final  . Eosinophils Absolute 06/11/2015 0.3  0 - 0.7 K/uL Final  . Basophils Relative 06/11/2015 0   Final  . Basophils Absolute 06/11/2015 0.0  0 - 0.1 K/uL Final  . Sodium 06/11/2015 134* 135 - 145 mmol/L Final  . Potassium 06/11/2015 3.7  3.5 - 5.1 mmol/L Final  . Chloride 06/11/2015 100* 101 - 111 mmol/L Final  . CO2 06/11/2015 28  22 - 32 mmol/L Final  . Glucose, Bld 06/11/2015 124* 65 - 99 mg/dL Final  . BUN 06/11/2015 13  6 - 20 mg/dL Final  . Creatinine, Ser 06/11/2015 0.63  0.44 - 1.00 mg/dL Final  . Calcium 06/11/2015 9.4  8.9 - 10.3 mg/dL Final  . Total Protein 06/11/2015 7.3  6.5 - 8.1 g/dL Final  . Albumin 06/11/2015 4.5  3.5 - 5.0 g/dL Final  . AST 06/11/2015 18  15 - 41 U/L Final  . ALT 06/11/2015 15  14 - 54 U/L Final  .  Alkaline Phosphatase 06/11/2015 41  38 - 126 U/L Final  . Total Bilirubin 06/11/2015 0.6  0.3 - 1.2 mg/dL Final  . GFR calc non Af Amer 06/11/2015 >60  >60 mL/min Final  . GFR calc Af Amer 06/11/2015 >60  >60 mL/min Final   Comment: (NOTE) The eGFR has been calculated using the CKD EPI equation. This calculation has not been validated in all clinical situations. eGFR's persistently <60 mL/min signify possible Chronic Kidney Disease.   . Anion gap 06/11/2015 6  5 - 15 Final   all lab data has been reviewed   ASSESSMENT: Carcinoma breast patient is on anti-hormonal therapy Anastrozole was discontinued because of significant joint pains Patient is taking tamoxifen Patient and number of complaints but significant weight loss is bothersome.  Considering patient's locally advanced breast cancer I would like to restage the patient with PET scan.  Tumor markers are pending.  Muscle aches and pains are not secondary to tamoxifen that has been explained to the patient.    Patient expressed understanding and was in agreement with this plan. She also understands that She can call clinic at any time with any questions, concerns, or complaints.    No  matching staging information was found for the patient.  Forest Gleason, MD   06/11/2015 1:01 PM

## 2015-06-12 LAB — CANCER ANTIGEN 27.29: CA 27.29: 4.4 U/mL (ref 0.0–38.6)

## 2015-06-17 ENCOUNTER — Ambulatory Visit
Admission: RE | Admit: 2015-06-17 | Discharge: 2015-06-17 | Disposition: A | Discharge status: Home / Self Care | Payer: Medicare Other | Source: Ambulatory Visit | Attending: Oncology | Admitting: Oncology

## 2015-06-17 DIAGNOSIS — R630 Anorexia: Secondary | ICD-10-CM

## 2015-06-17 DIAGNOSIS — K8020 Calculus of gallbladder without cholecystitis without obstruction: Secondary | ICD-10-CM | POA: Insufficient documentation

## 2015-06-17 DIAGNOSIS — R634 Abnormal weight loss: Secondary | ICD-10-CM | POA: Diagnosis not present

## 2015-06-17 DIAGNOSIS — Z0189 Encounter for other specified special examinations: Secondary | ICD-10-CM | POA: Insufficient documentation

## 2015-06-17 DIAGNOSIS — C50919 Malignant neoplasm of unspecified site of unspecified female breast: Secondary | ICD-10-CM | POA: Insufficient documentation

## 2015-06-17 DIAGNOSIS — C50912 Malignant neoplasm of unspecified site of left female breast: Secondary | ICD-10-CM | POA: Diagnosis not present

## 2015-06-17 LAB — GLUCOSE, CAPILLARY: GLUCOSE-CAPILLARY: 109 mg/dL — AB (ref 65–99)

## 2015-06-17 MED ORDER — FLUDEOXYGLUCOSE F - 18 (FDG) INJECTION
12.0900 | Freq: Once | INTRAVENOUS | Status: AC | PRN
  Administered 2015-06-17: 12.09 via INTRAVENOUS

## 2015-06-18 ENCOUNTER — Inpatient Hospital Stay (HOSPITAL_BASED_OUTPATIENT_CLINIC_OR_DEPARTMENT_OTHER): Payer: Medicare Other | Admitting: Oncology

## 2015-06-18 ENCOUNTER — Encounter: Payer: Self-pay | Admitting: Radiation Oncology

## 2015-06-18 ENCOUNTER — Encounter: Payer: Self-pay | Admitting: Oncology

## 2015-06-18 ENCOUNTER — Ambulatory Visit
Admission: RE | Admit: 2015-06-18 | Discharge: 2015-06-18 | Disposition: A | Discharge status: Home / Self Care | Payer: Medicare Other | Source: Ambulatory Visit | Attending: Radiation Oncology | Admitting: Radiation Oncology

## 2015-06-18 VITALS — BP 141/93 | HR 106 | Temp 97.3°F | Wt 171.6 lb

## 2015-06-18 VITALS — BP 145/90 | HR 92 | Temp 97.3°F | Resp 18 | Wt 171.5 lb

## 2015-06-18 DIAGNOSIS — Z803 Family history of malignant neoplasm of breast: Secondary | ICD-10-CM

## 2015-06-18 DIAGNOSIS — Z9221 Personal history of antineoplastic chemotherapy: Secondary | ICD-10-CM

## 2015-06-18 DIAGNOSIS — Z170 Estrogen receptor positive status [ER+]: Secondary | ICD-10-CM | POA: Diagnosis not present

## 2015-06-18 DIAGNOSIS — R630 Anorexia: Secondary | ICD-10-CM

## 2015-06-18 DIAGNOSIS — R634 Abnormal weight loss: Secondary | ICD-10-CM

## 2015-06-18 DIAGNOSIS — R5383 Other fatigue: Secondary | ICD-10-CM | POA: Diagnosis not present

## 2015-06-18 DIAGNOSIS — M2550 Pain in unspecified joint: Secondary | ICD-10-CM

## 2015-06-18 DIAGNOSIS — C50912 Malignant neoplasm of unspecified site of left female breast: Secondary | ICD-10-CM

## 2015-06-18 DIAGNOSIS — M791 Myalgia: Secondary | ICD-10-CM

## 2015-06-18 DIAGNOSIS — Z87891 Personal history of nicotine dependence: Secondary | ICD-10-CM

## 2015-06-18 DIAGNOSIS — R110 Nausea: Secondary | ICD-10-CM

## 2015-06-18 DIAGNOSIS — F329 Major depressive disorder, single episode, unspecified: Secondary | ICD-10-CM

## 2015-06-18 DIAGNOSIS — Z79899 Other long term (current) drug therapy: Secondary | ICD-10-CM

## 2015-06-18 DIAGNOSIS — G4730 Sleep apnea, unspecified: Secondary | ICD-10-CM | POA: Diagnosis not present

## 2015-06-18 DIAGNOSIS — R531 Weakness: Secondary | ICD-10-CM

## 2015-06-18 DIAGNOSIS — G629 Polyneuropathy, unspecified: Secondary | ICD-10-CM

## 2015-06-18 DIAGNOSIS — Z79810 Long term (current) use of selective estrogen receptor modulators (SERMs): Secondary | ICD-10-CM | POA: Diagnosis not present

## 2015-06-18 DIAGNOSIS — F32A Depression, unspecified: Secondary | ICD-10-CM

## 2015-06-18 HISTORY — DX: Depression, unspecified: F32.A

## 2015-06-18 HISTORY — DX: Other fatigue: R53.83

## 2015-06-18 MED ORDER — CITALOPRAM HYDROBROMIDE 20 MG PO TABS: 20.0000 mg | ORAL_TABLET | Freq: Every day | ORAL | Status: DC

## 2015-06-18 NOTE — Progress Notes (Signed)
Patient here for CT results.  

## 2015-06-18 NOTE — Progress Notes (Signed)
Radiation Oncology Follow up Note  Name: Amanda Liu   Date:   06/18/2015 MRN:  [id number] DOB: [birth year]    This 60 y.o. female presents to the clinic today for follow-up for breast cancer stage IIIB (T4 N2 a M0) ER/PR positive HER-2/neu negative invasive mammary carcinoma now out 3 years since completing whole breast radiation as well as peripheral lymphatics.  REFERRING PROVIDER: Lorelee Market, MD  HPI: Patient is a 60 year old female now out 3 years having completed whole breast radiation and peripheral lymphatics to her left breast for a stage IIIB invasive mammary carcinoma status post new adjuvant chemotherapy than wide local excision and axillary node dissection. She currently is on tamoxifen having tried multiple other aromatase inhibitors causing significant side effects she has had has had a 10 pound weight loss had a PET CT scan recently which I have reviewed shows no evidence to suggest metastatic disease. She specifically denies breast tenderness cough or bone pain.  COMPLICATIONS OF TREATMENT: none  FOLLOW UP COMPLIANCE: keeps appointments   PHYSICAL EXAM:  BP 141/93 mmHg  Pulse 106  Temp(Src) 97.3 F (36.3 C)  Wt 171 lb 10.1 oz (77.85 kg) Lungs are clear to A&P cardiac examination essentially unremarkable with regular rate and rhythm. No dominant mass or nodularity is noted in either breast in 2 positions examined. Incision is well-healed. No axillary or supraclavicular adenopathy is appreciated. Cosmetic result is excellent. Well-developed well-nourished patient in NAD. HEENT reveals PERLA, EOMI, discs not visualized.  Oral cavity is clear. No oral mucosal lesions are identified. Neck is clear without evidence of cervical or supraclavicular adenopathy. Lungs are clear to A&P. Cardiac examination is essentially unremarkable with regular rate and rhythm without murmur rub or thrill. Abdomen is benign with no organomegaly or masses noted. Motor sensory and DTR levels  are equal and symmetric in the upper and lower extremities. Cranial nerves II through XII are grossly intact. Proprioception is intact. No peripheral adenopathy or edema is identified. No motor or sensory levels are noted. Crude visual fields are within normal range.  RADIOLOGY RESULTS: Most recent mammograms are reviewed and showed no evidence of disease back in May 2016  PLAN: At the present time from a breast and point she is doing well with no evidence of disease. PET results are good. She'll have a discussion with medical oncology today about them. Otherwise I'm please were overall progress. She continues on tamoxifen. I have asked to see her back in 1 year for follow-up. Not sure about her weight loss issues but I suggested she exercises more to stimulant her appetite and help with joint pain. Patient is to call sooner with any concerns.  I would like to take this opportunity for allowing me to participate in the care of your patient.Armstead Peaks., MD

## 2015-06-18 NOTE — Progress Notes (Signed)
[place] @ [facility] [phone]) [phone]  Fax:(336) [place] OB: [birth year]  MR#: [id number]  WPY#:[id number]  Patient Care Team: Lorelee Market, MD as PCP - General (Family Medicine)  CHIEF COMPLAINT:  Chief Complaint  Patient presents with  . Results  28. 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 Anastrozole 1 mg started in January of 2014 3.stopped ARIMIDEX  Ion July 11 th 2016 . be cause of bone pain 4.  Started on Aromasin from January 07, 2015 5.  Because of side effect of Aromasin patient has been started on tamoxifen (August, 2016)     INTERVAL HISTORY: 60 year old lady with history of carcinoma breast came today further follow-up. Patient started having some problem with joint pains not able to walk not able to sleep.  Patient was taken off anastrozole followed that she has noticed significant improvement.general status: Patient is feeling weak and tired.  No change in a performance status.  No chills.  No fever. Patient complains of feeling fatigued. She is nauseated, appetite declining and losing weight. States she has muscle and joint pain. By the end of the day she is "worn out". Patient continues to have neuropathy Patient complains of 10 pound of weight loss since last August. Normalize weakness.   PATIENT CAME TODAY FURTHER FOLLOW-UP  After getting PET scan done.  Accompanied with the family. Origin to patient after switching to tamoxifen bony pains have improved but appetite remains poor  Review of system GENERAL: Feels somewhat weak tired.  Has lost approximately 10 pounds of weight since last August.  Poor appetite. PERFORMANCE STATUS (ECOG)01 HEENT:  No visual changes, runny nose, sore throat, mouth sores or  tenderness. Lungs: No shortness of breath or cough.  No hemoptysis. Cardiac:  No chest pain, palpitations, orthopnea, or PND. GI:  No nausea, vomiting, diarrhea, constipation, melena or hematochezia. GU:  No urgency, frequency, dysuria, or hematuria. Musculoskeletal:  Muscle pains.  Aches and pains. Extremities:  No pain or swelling. Skin:  No rashes or skin changes. Neuro: Neuropathy in lower extremity is getting worse Endocrine:  No diabetes, thyroid issues, hot flashes or night sweats. Psych:  No mood changes, depression or anxiety. Pain:  No focal pain. Review of systems:  All other systems reviewed and found to be negative.  As per HPI. Otherwise, a complete review of systems is negatve.  PAST MEDICAL HISTORY: Past Medical History  Diagnosis Date  . Breast cancer ([place]) 02/2012    left breast, radiation and chemotherapy    PAST SURGICAL HISTORY: Past Surgical History  Procedure Laterality Date  . Breast biopsy Left 12/2011    positive  . Breast excisional biopsy Left 02/2012    positive    FAMILY HISTORY Family History  Problem Relation Age of Onset  . Breast cancer Sister 80  . Breast cancer Maternal Grandmother 16    ADVANCED DIRECTIVES:  Patient does not have any living will or healthcare power of attorney.  Information was given .  Available resources had been discussed.  We will follow-up on subsequent appointments regarding this issue HEALTH MAINTENANCE: Social History  Substance Use Topics  . Smoking status: Former Research scientist (life sciences)  . Smokeless tobacco: None  . Alcohol Use: None      No Known Allergies  Current Outpatient Prescriptions  Medication Sig  Dispense Refill  . amLODipine (NORVASC) 5 MG tablet TK 1 T PO QD  5  . hydrochlorothiazide (HYDRODIURIL) 25 MG tablet Take 1 tablet (25 mg total) by mouth daily. 30 tablet 5  . lisinopril (PRINIVIL,ZESTRIL) 10 MG tablet TK 1 T PO QD  5  . tamoxifen (NOLVADEX) 20 MG tablet Take 1 tablet (20 mg total) by mouth  daily. 30 tablet 6   No current facility-administered medications for this visit.    OBJECTIVE:  Filed Vitals:   06/18/15 1522  BP: 145/90  Pulse: 92  Temp: 97.3 F (36.3 C)  Resp: 18     There is no height on file to calculate BMI.    ECOG FS:1 - Symptomatic but completely ambulatory  PHYSICAL EXAM: GENERAL:  Well developed, well nourished, sitting comfortably in the exam room in no acute distress. MENTAL STATUS:  Alert and oriented to person, place and time.  ENT:  Oropharynx clear without lesion.  Tongue normal. Mucous membranes moist.  RESPIRATORY:  Clear to auscultation without rales, wheezes or rhonchi. CARDIOVASCULAR:  Regular rate and rhythm without murmur, rub or gallop. BREAST:  Right breast without masses, skin changes or nipple discharge.  Left breast without masses, skin changes or nipple discharge. ABDOMEN:  Soft, non-tender, with active bowel sounds, and no hepatosplenomegaly.  No masses. BACK:  No CVA tenderness.  No tenderness on percussion of the back or rib cage. SKIN:  No rashes, ulcers or lesions. EXTREMITIES: No edema, no skin discoloration or tenderness.  No palpable cords. LYMPH NODES: No palpable cervical, supraclavicular, axillary or inguinal adenopathy  NEUROLOGICAL: Unremarkable. PSYCH:  Appropriate.   LAB RESULTS:  Hospital Outpatient Visit on 06/17/2015  Component Date Value Ref Range Status  . Glucose-Capillary 06/17/2015 109* 65 - 99 mg/dL Final   all lab data has been reviewed   ASSESSMENT: Carcinoma breast patient is on anti-hormonal therapy Anastrozole was discontinued because of significant joint pains Patient is taking tamoxifen  PET scan has been reviewed independently and does not show any evidence of malignancy. PET scan also has been reviewed with the patient.  Loss of weight can be secondary to depression or some other causes.  Will start a trial of Celexa 20 mg at bedtime and patient was instructed to start multivitamins and  reevaluate patient in 8 weeks to see there is any improvement. Tumor  markers are within normal limit and all of the lab data has been reviewed and they are within normal limit  Duration of visit is 25 minutes and 50% of time was spent discussing  Various causes of loss of weight other than malignancy Patient expressed understanding and was in agreement with this plan. She also understands that She can call clinic at any time with any questions, concerns, or complaints.    No matching staging information was found for the patient.  Forest Gleason, MD   06/18/2015 3:44 PM

## 2015-06-19 ENCOUNTER — Ambulatory Visit: Payer: Self-pay | Admitting: Radiation Oncology

## 2015-07-10 ENCOUNTER — Inpatient Hospital Stay: Payer: Medicare Other | Attending: Oncology

## 2015-07-30 ENCOUNTER — Other Ambulatory Visit: Payer: Medicare Other

## 2015-07-30 ENCOUNTER — Inpatient Hospital Stay: Payer: Medicare Other | Admitting: Oncology

## 2015-08-18 ENCOUNTER — Other Ambulatory Visit: Payer: Self-pay | Admitting: Oncology

## 2015-08-21 ENCOUNTER — Inpatient Hospital Stay: Payer: Medicare Other

## 2015-08-21 ENCOUNTER — Encounter: Payer: Self-pay | Admitting: Oncology

## 2015-08-21 ENCOUNTER — Inpatient Hospital Stay (HOSPITAL_BASED_OUTPATIENT_CLINIC_OR_DEPARTMENT_OTHER): Payer: Medicare Other | Admitting: Oncology

## 2015-08-21 ENCOUNTER — Inpatient Hospital Stay: Payer: Medicare Other | Attending: Oncology

## 2015-08-21 VITALS — BP 137/87 | HR 56 | Temp 97.6°F | Resp 18 | Wt 169.4 lb

## 2015-08-21 DIAGNOSIS — C50919 Malignant neoplasm of unspecified site of unspecified female breast: Secondary | ICD-10-CM

## 2015-08-21 DIAGNOSIS — Z170 Estrogen receptor positive status [ER+]: Secondary | ICD-10-CM | POA: Diagnosis not present

## 2015-08-21 DIAGNOSIS — Z79810 Long term (current) use of selective estrogen receptor modulators (SERMs): Secondary | ICD-10-CM | POA: Diagnosis not present

## 2015-08-21 DIAGNOSIS — Z9189 Other specified personal risk factors, not elsewhere classified: Secondary | ICD-10-CM

## 2015-08-21 DIAGNOSIS — R630 Anorexia: Secondary | ICD-10-CM | POA: Diagnosis not present

## 2015-08-21 DIAGNOSIS — R5383 Other fatigue: Secondary | ICD-10-CM | POA: Diagnosis not present

## 2015-08-21 DIAGNOSIS — C50912 Malignant neoplasm of unspecified site of left female breast: Secondary | ICD-10-CM | POA: Insufficient documentation

## 2015-08-21 DIAGNOSIS — Z79899 Other long term (current) drug therapy: Secondary | ICD-10-CM | POA: Insufficient documentation

## 2015-08-21 DIAGNOSIS — Z87891 Personal history of nicotine dependence: Secondary | ICD-10-CM

## 2015-08-21 DIAGNOSIS — F329 Major depressive disorder, single episode, unspecified: Secondary | ICD-10-CM

## 2015-08-21 DIAGNOSIS — M8580 Other specified disorders of bone density and structure, unspecified site: Secondary | ICD-10-CM

## 2015-08-21 DIAGNOSIS — Z803 Family history of malignant neoplasm of breast: Secondary | ICD-10-CM

## 2015-08-21 DIAGNOSIS — R634 Abnormal weight loss: Secondary | ICD-10-CM | POA: Insufficient documentation

## 2015-08-21 DIAGNOSIS — R531 Weakness: Secondary | ICD-10-CM | POA: Diagnosis not present

## 2015-08-21 DIAGNOSIS — Z95828 Presence of other vascular implants and grafts: Secondary | ICD-10-CM

## 2015-08-21 DIAGNOSIS — Z452 Encounter for adjustment and management of vascular access device: Secondary | ICD-10-CM | POA: Insufficient documentation

## 2015-08-21 LAB — CBC WITH DIFFERENTIAL/PLATELET
BASOS PCT: 1 %
Basophils Absolute: 0 10*3/uL (ref 0–0.1)
EOS ABS: 0.3 10*3/uL (ref 0–0.7)
EOS PCT: 5 %
HCT: 38.9 % (ref 35.0–47.0)
HEMOGLOBIN: 13.6 g/dL (ref 12.0–16.0)
Lymphocytes Relative: 24 %
Lymphs Abs: 1.4 10*3/uL (ref 1.0–3.6)
MCH: 32.8 pg (ref 26.0–34.0)
MCHC: 34.8 g/dL (ref 32.0–36.0)
MCV: 94.1 fL (ref 80.0–100.0)
MONOS PCT: 7 %
Monocytes Absolute: 0.4 10*3/uL (ref 0.2–0.9)
NEUTROS PCT: 65 %
Neutro Abs: 3.9 10*3/uL (ref 1.4–6.5)
PLATELETS: 166 10*3/uL (ref 150–440)
RBC: 4.14 MIL/uL (ref 3.80–5.20)
RDW: 12.6 % (ref 11.5–14.5)
WBC: 6 10*3/uL (ref 3.6–11.0)

## 2015-08-21 LAB — COMPREHENSIVE METABOLIC PANEL
ALBUMIN: 4.5 g/dL (ref 3.5–5.0)
ALK PHOS: 30 U/L — AB (ref 38–126)
ALT: 16 U/L (ref 14–54)
ANION GAP: 6 (ref 5–15)
AST: 23 U/L (ref 15–41)
BUN: 13 mg/dL (ref 6–20)
CHLORIDE: 100 mmol/L — AB (ref 101–111)
CO2: 27 mmol/L (ref 22–32)
Calcium: 8.8 mg/dL — ABNORMAL LOW (ref 8.9–10.3)
Creatinine, Ser: 0.63 mg/dL (ref 0.44–1.00)
GFR calc non Af Amer: >60 mL/min (ref >60)
GLUCOSE: 109 mg/dL — AB (ref 65–99)
POTASSIUM: 3.7 mmol/L (ref 3.5–5.1)
SODIUM: 133 mmol/L — AB (ref 135–145)
Total Bilirubin: 0.5 mg/dL (ref 0.3–1.2)
Total Protein: 7.2 g/dL (ref 6.5–8.1)

## 2015-08-21 MED ORDER — SODIUM CHLORIDE 0.9% FLUSH
10.0000 mL | INTRAVENOUS | Status: DC | PRN
  Administered 2015-08-21: 10 mL via INTRAVENOUS
  Filled 2015-08-21: qty 10

## 2015-08-21 MED ORDER — LISINOPRIL 10 MG PO TABS: 10.0000 mg | ORAL_TABLET | Freq: Every day | ORAL | Status: DC

## 2015-08-21 MED ORDER — HEPARIN SOD (PORK) LOCK FLUSH 100 UNIT/ML IV SOLN
500.0000 [IU] | Freq: Once | INTRAVENOUS | Status: AC
  Administered 2015-08-21: 500 [IU] via INTRAVENOUS

## 2015-08-21 MED ORDER — TAMOXIFEN CITRATE 20 MG PO TABS: 20.0000 mg | ORAL_TABLET | Freq: Every day | ORAL | Status: DC

## 2015-08-21 NOTE — Progress Notes (Signed)
[place] @ [facility] [phone]  Fax:([phone]) [place] OB: September 19, 1955  MR#: [id number]  JYN#:[id number]  Patient Care Team: Lorelee Market, MD as PCP - General (Family Medicine)  CHIEF COMPLAINT:  Chief Complaint  Patient presents with  . Breast Cancer  36. 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 Anastrozole 1 mg started in January of 2014 3.stopped ARIMIDEX  Ion July 11 th 2016 . be cause of bone pain 4.  Started on Aromasin from January 07, 2015 5.  Because of side effect of Aromasin patient has been started on tamoxifen (August, 2016)     INTERVAL HISTORY: 60 year old lady with history of carcinoma breast came today further follow-up. Patient started having some problem with joint pains not able to walk not able to sleep.  Patient was taken off anastrozole followed that she has noticed significant improvement.general status: Patient is feeling weak and tired.  No change in a performance status.  No chills.  No fever. Patient is tolerating tamoxifen very well.  No chills.  No fever.  No nausea or vomiting or diarrhea.  Weight loss has been stabilized.   PATIENT CAME TODAY FURTHER FOLLOW-UP  After getting PET scan done.  Accompanied with the family. Origin to patient after switching to tamoxifen bony pains have improved but appetite remains poor  Review of system GENERAL: Feels somewhat weak tired.  Has lost approximately 10 pounds of weight since last August.  Poor appetite. PERFORMANCE STATUS (ECOG)01 HEENT:  No visual changes, runny nose, sore throat, mouth sores or tenderness. Lungs: No shortness of breath or cough.  No hemoptysis. Cardiac:  No chest pain, palpitations, orthopnea, or PND. GI:  No nausea, vomiting,  diarrhea, constipation, melena or hematochezia. GU:  No urgency, frequency, dysuria, or hematuria. Musculoskeletal:  Muscle pains.  Aches and pains. Extremities:  No pain or swelling. Skin:  No rashes or skin changes. Neuro: Neuropathy in lower extremity is getting worse Endocrine:  No diabetes, thyroid issues, hot flashes or night sweats. Psych:  No mood changes, depression or anxiety. Pain:  No focal pain. Review of systems:  All other systems reviewed and found to be negative.  As per HPI. Otherwise, a complete review of systems is negatve.  PAST MEDICAL HISTORY: Past Medical History  Diagnosis Date  . Breast cancer ([place]) 02/2012    left breast, radiation and chemotherapy  . Fatigue due to depression 06/18/2015    PAST SURGICAL HISTORY: Past Surgical History  Procedure Laterality Date  . Breast biopsy Left 12/2011    positive  . Breast excisional biopsy Left 02/2012    positive    FAMILY HISTORY Family History  Problem Relation Age of Onset  . Breast cancer Sister 33  . Breast cancer Maternal Grandmother 38    ADVANCED DIRECTIVES:  Patient does not have any living will or healthcare power of attorney.  Information was given .  Available resources had been discussed.  We will follow-up on subsequent appointments regarding this issue HEALTH MAINTENANCE: Social History  Substance Use Topics  . Smoking status: Former Research scientist (life sciences)  . Smokeless tobacco: None  . Alcohol Use: None      No Known Allergies  Current Outpatient Prescriptions  Medication Sig Dispense Refill  . amLODipine (NORVASC) 5 MG tablet TK 1 T PO QD  5  . citalopram (CELEXA) 20 MG tablet Take 1 tablet (20 mg total) by mouth daily. 30 tablet 5  . hydrochlorothiazide (HYDRODIURIL) 25 MG tablet Take 1 tablet (25 mg total) by mouth daily. 30 tablet 5  . lisinopril (PRINIVIL,ZESTRIL) 10 MG tablet TK 1 T PO QD  5  . tamoxifen (NOLVADEX) 20 MG tablet Take 1 tablet (20 mg total) by mouth daily. 30 tablet 6   No  current facility-administered medications for this visit.    OBJECTIVE:  Filed Vitals:   08/21/15 0916  BP: 137/87  Pulse: 56  Temp: 97.6 F (36.4 C)  Resp: 18     There is no height on file to calculate BMI.    ECOG FS:1 - Symptomatic but completely ambulatory  PHYSICAL EXAM: GENERAL:  Well developed, well nourished, sitting comfortably in the exam room in no acute distress. MENTAL STATUS:  Alert and oriented to person, place and time.  ENT:  Oropharynx clear without lesion.  Tongue normal. Mucous membranes moist.  RESPIRATORY:  Clear to auscultation without rales, wheezes or rhonchi. CARDIOVASCULAR:  Regular rate and rhythm without murmur, rub or gallop. BREAST:  Right breast without masses, skin changes or nipple discharge.  Left breast without masses, skin changes or nipple discharge. ABDOMEN:  Soft, non-tender, with active bowel sounds, and no hepatosplenomegaly.  No masses. BACK:  No CVA tenderness.  No tenderness on percussion of the back or rib cage. SKIN:  No rashes, ulcers or lesions. EXTREMITIES: No edema, no skin discoloration or tenderness.  No palpable cords. LYMPH NODES: No palpable cervical, supraclavicular, axillary or inguinal adenopathy  NEUROLOGICAL: Unremarkable. PSYCH:  Appropriate.   LAB RESULTS:  Appointment on 08/21/2015  Component Date Value Ref Range Status  . WBC 08/21/2015 6.0  3.6 - 11.0 K/uL Final  . RBC 08/21/2015 4.14  3.80 - 5.20 MIL/uL Final  . Hemoglobin 08/21/2015 13.6  12.0 - 16.0 g/dL Final  . HCT 08/21/2015 38.9  35.0 - 47.0 % Final  . MCV 08/21/2015 94.1  80.0 - 100.0 fL Final  . MCH 08/21/2015 32.8  26.0 - 34.0 pg Final  . MCHC 08/21/2015 34.8  32.0 - 36.0 g/dL Final  . RDW 08/21/2015 12.6  11.5 - 14.5 % Final  . Platelets 08/21/2015 166  150 - 440 K/uL Final  . Neutrophils Relative % 08/21/2015 65   Final  . Neutro Abs 08/21/2015 3.9  1.4 - 6.5 K/uL Final  . Lymphocytes Relative 08/21/2015 24   Final  . Lymphs Abs 08/21/2015  1.4  1.0 - 3.6 K/uL Final  . Monocytes Relative 08/21/2015 7   Final  . Monocytes Absolute 08/21/2015 0.4  0.2 - 0.9 K/uL Final  . Eosinophils Relative 08/21/2015 5   Final  . Eosinophils Absolute 08/21/2015 0.3  0 - 0.7 K/uL Final  . Basophils Relative 08/21/2015 1   Final  . Basophils Absolute 08/21/2015 0.0  0 - 0.1 K/uL Final   all lab data has been reviewed   ASSESSMENT: Carcinoma breast patient is on anti-hormonal therapy Anastrozole was discontinued because of significant joint pains Patient is taking tamoxifen Tolerating well.  Patient had a previous history of hysterectomy done 20 years ago Weight loss has been stabilized Mammogram in May of 2017,  All lab data has been reviewed.  No abnormality detected.  Patient was advised to continue regular check up colonoscopy in yearly mammogram. Medicines she would be finishing 5 years of tamoxifen my associate will assume her care he because of my  retirement  Plan, we will probably do a breast cancer index to decide on extended anti-hormonal therapy We will also continue lisinopril for 30 days as patient is trying to transfer her care to another primary care physician  Patient is an ex-smoker and was advised to get in touch with [place] next year for routine screening CT scan for lung ordeal  No matching staging information was found for the patient.  Forest Gleason, MD   08/21/2015 9:30 AM

## 2015-08-21 NOTE — Progress Notes (Signed)
Patient requesting refill for Tamoxifen.

## 2015-08-25 ENCOUNTER — Ambulatory Visit: Payer: Medicare Other | Admitting: Oncology

## 2015-08-25 ENCOUNTER — Other Ambulatory Visit: Payer: Medicare Other

## 2015-08-27 ENCOUNTER — Other Ambulatory Visit: Payer: Self-pay | Admitting: Oncology

## 2015-08-28 ENCOUNTER — Other Ambulatory Visit: Payer: Self-pay | Admitting: Oncology

## 2015-08-31 ENCOUNTER — Other Ambulatory Visit: Payer: Self-pay | Admitting: *Deleted

## 2015-08-31 DIAGNOSIS — C50919 Malignant neoplasm of unspecified site of unspecified female breast: Secondary | ICD-10-CM

## 2015-08-31 MED ORDER — HYDROCHLOROTHIAZIDE 25 MG PO TABS: 25.0000 mg | ORAL_TABLET | Freq: Every day | ORAL | Status: DC

## 2015-09-24 ENCOUNTER — Inpatient Hospital Stay: Payer: Medicare Other

## 2015-09-24 ENCOUNTER — Inpatient Hospital Stay: Payer: Medicare Other | Admitting: Oncology

## 2015-10-02 ENCOUNTER — Inpatient Hospital Stay: Payer: Medicare Other | Attending: Oncology

## 2015-10-02 DIAGNOSIS — Z79810 Long term (current) use of selective estrogen receptor modulators (SERMs): Secondary | ICD-10-CM | POA: Insufficient documentation

## 2015-10-02 DIAGNOSIS — Z170 Estrogen receptor positive status [ER+]: Secondary | ICD-10-CM | POA: Insufficient documentation

## 2015-10-02 DIAGNOSIS — C801 Malignant (primary) neoplasm, unspecified: Secondary | ICD-10-CM

## 2015-10-02 DIAGNOSIS — R5383 Other fatigue: Secondary | ICD-10-CM | POA: Insufficient documentation

## 2015-10-02 DIAGNOSIS — R531 Weakness: Secondary | ICD-10-CM | POA: Insufficient documentation

## 2015-10-02 DIAGNOSIS — Z452 Encounter for adjustment and management of vascular access device: Secondary | ICD-10-CM | POA: Diagnosis not present

## 2015-10-02 DIAGNOSIS — Z9221 Personal history of antineoplastic chemotherapy: Secondary | ICD-10-CM | POA: Insufficient documentation

## 2015-10-02 DIAGNOSIS — Z923 Personal history of irradiation: Secondary | ICD-10-CM | POA: Diagnosis not present

## 2015-10-02 DIAGNOSIS — R630 Anorexia: Secondary | ICD-10-CM | POA: Diagnosis not present

## 2015-10-02 DIAGNOSIS — Z87891 Personal history of nicotine dependence: Secondary | ICD-10-CM | POA: Insufficient documentation

## 2015-10-02 DIAGNOSIS — F329 Major depressive disorder, single episode, unspecified: Secondary | ICD-10-CM | POA: Insufficient documentation

## 2015-10-02 DIAGNOSIS — R634 Abnormal weight loss: Secondary | ICD-10-CM | POA: Diagnosis not present

## 2015-10-02 DIAGNOSIS — Z803 Family history of malignant neoplasm of breast: Secondary | ICD-10-CM | POA: Insufficient documentation

## 2015-10-02 DIAGNOSIS — C50912 Malignant neoplasm of unspecified site of left female breast: Secondary | ICD-10-CM | POA: Insufficient documentation

## 2015-10-02 MED ORDER — HEPARIN SOD (PORK) LOCK FLUSH 100 UNIT/ML IV SOLN
500.0000 [IU] | Freq: Once | INTRAVENOUS | Status: AC
  Administered 2015-10-02: 500 [IU] via INTRAVENOUS
  Filled 2015-10-02: qty 5

## 2015-10-02 MED ORDER — SODIUM CHLORIDE 0.9% FLUSH
10.0000 mL | INTRAVENOUS | Status: DC | PRN
  Administered 2015-10-02: 10 mL via INTRAVENOUS
  Filled 2015-10-02: qty 10

## 2015-10-09 ENCOUNTER — Other Ambulatory Visit: Payer: Self-pay | Admitting: Oncology

## 2015-10-21 ENCOUNTER — Inpatient Hospital Stay (HOSPITAL_BASED_OUTPATIENT_CLINIC_OR_DEPARTMENT_OTHER): Payer: Medicare Other | Admitting: Oncology

## 2015-10-21 ENCOUNTER — Inpatient Hospital Stay: Payer: Medicare Other

## 2015-10-21 ENCOUNTER — Other Ambulatory Visit: Payer: Self-pay | Admitting: Oncology

## 2015-10-21 VITALS — BP 137/92 | HR 69 | Temp 96.8°F | Resp 18 | Wt 172.8 lb

## 2015-10-21 DIAGNOSIS — Z170 Estrogen receptor positive status [ER+]: Secondary | ICD-10-CM

## 2015-10-21 DIAGNOSIS — Z452 Encounter for adjustment and management of vascular access device: Secondary | ICD-10-CM

## 2015-10-21 DIAGNOSIS — F329 Major depressive disorder, single episode, unspecified: Secondary | ICD-10-CM

## 2015-10-21 DIAGNOSIS — C50912 Malignant neoplasm of unspecified site of left female breast: Secondary | ICD-10-CM

## 2015-10-21 DIAGNOSIS — R531 Weakness: Secondary | ICD-10-CM | POA: Diagnosis not present

## 2015-10-21 DIAGNOSIS — R630 Anorexia: Secondary | ICD-10-CM

## 2015-10-21 DIAGNOSIS — R5383 Other fatigue: Secondary | ICD-10-CM

## 2015-10-21 DIAGNOSIS — R634 Abnormal weight loss: Secondary | ICD-10-CM

## 2015-10-21 DIAGNOSIS — Z79810 Long term (current) use of selective estrogen receptor modulators (SERMs): Secondary | ICD-10-CM

## 2015-10-21 DIAGNOSIS — Z803 Family history of malignant neoplasm of breast: Secondary | ICD-10-CM

## 2015-10-21 DIAGNOSIS — C50919 Malignant neoplasm of unspecified site of unspecified female breast: Secondary | ICD-10-CM

## 2015-10-21 DIAGNOSIS — Z87891 Personal history of nicotine dependence: Secondary | ICD-10-CM

## 2015-10-21 DIAGNOSIS — Z9221 Personal history of antineoplastic chemotherapy: Secondary | ICD-10-CM

## 2015-10-21 DIAGNOSIS — Z923 Personal history of irradiation: Secondary | ICD-10-CM

## 2015-10-21 LAB — COMPREHENSIVE METABOLIC PANEL
ALT: 14 U/L (ref 14–54)
AST: 22 U/L (ref 15–41)
Albumin: 4.3 g/dL (ref 3.5–5.0)
Alkaline Phosphatase: 31 U/L — ABNORMAL LOW (ref 38–126)
Anion gap: 8 (ref 5–15)
BUN: 12 mg/dL (ref 6–20)
CO2: 26 mmol/L (ref 22–32)
Calcium: 8.9 mg/dL (ref 8.9–10.3)
Chloride: 102 mmol/L (ref 101–111)
Creatinine, Ser: 0.66 mg/dL (ref 0.44–1.00)
GFR calc Af Amer: >60 mL/min (ref >60)
GFR calc non Af Amer: >60 mL/min (ref >60)
Glucose, Bld: 105 mg/dL — ABNORMAL HIGH (ref 65–99)
Potassium: 3.7 mmol/L (ref 3.5–5.1)
Sodium: 136 mmol/L (ref 135–145)
Total Bilirubin: 0.3 mg/dL (ref 0.3–1.2)
Total Protein: 6.7 g/dL (ref 6.5–8.1)

## 2015-10-21 LAB — CBC WITH DIFFERENTIAL/PLATELET
Basophils Absolute: 0 10*3/uL (ref 0–0.1)
Basophils Relative: 1 %
EOS PCT: 4 %
Eosinophils Absolute: 0.2 10*3/uL (ref 0–0.7)
HCT: 39.4 % (ref 35.0–47.0)
Hemoglobin: 13.7 g/dL (ref 12.0–16.0)
LYMPHS ABS: 1.6 10*3/uL (ref 1.0–3.6)
Lymphocytes Relative: 28 %
MCH: 32.7 pg (ref 26.0–34.0)
MCHC: 34.8 g/dL (ref 32.0–36.0)
MCV: 94.1 fL (ref 80.0–100.0)
MONO ABS: 0.3 10*3/uL (ref 0.2–0.9)
MONOS PCT: 6 %
NEUTROS PCT: 61 %
Neutro Abs: 3.4 10*3/uL (ref 1.4–6.5)
PLATELETS: 161 10*3/uL (ref 150–440)
RBC: 4.18 MIL/uL (ref 3.80–5.20)
RDW: 13.2 % (ref 11.5–14.5)
WBC: 5.6 10*3/uL (ref 3.6–11.0)

## 2015-10-21 MED ORDER — LISINOPRIL 10 MG PO TABS
10.0000 mg | ORAL_TABLET | Freq: Every day | ORAL | Status: DC
Start: 2015-10-21 — End: 2015-11-17

## 2015-10-21 MED ORDER — AMLODIPINE BESYLATE 5 MG PO TABS: 5.0000 mg | ORAL_TABLET | Freq: Every day | ORAL | Status: DC

## 2015-10-21 NOTE — Progress Notes (Signed)
Patient needs amlodipine and lisinopril refills until she can get in with new PCP on June 20th.

## 2015-10-26 ENCOUNTER — Encounter: Payer: Self-pay | Admitting: Oncology

## 2015-10-26 NOTE — Progress Notes (Signed)
[place] @ [facility] Telephone:(336) [phone]  Fax:(336) [place] OB: [birth year]  MR#: [id number]  UXL#:[id number]  Patient Care Team: Lorelee Market, MD as PCP - General (Family Medicine)  CHIEF COMPLAINT:  Chief Complaint  Patient presents with  . Breast Cancer  78. 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 Anastrozole 1 mg started in January of 2014 3.stopped ARIMIDEX  Ion July 11 th 2016 . be cause of bone pain 4.  Started on Aromasin from January 07, 2015 5.  Because of side effect of Aromasin patient has been started on tamoxifen (August, 2016)     INTERVAL HISTORY: 61 year old lady with history of carcinoma breast came today further follow-up. Patient started having some problem with joint pains not able to walk not able to sleep.  Patient was taken off anastrozole followed that she has noticed significant improvement.general status: Patient is feeling weak and tired.  No change in a performance status.  No chills.  No fever. Patient is tolerating tamoxifen very well.  No chills.  No fever.  No nausea or vomiting or diarrhea.  Weight loss has been stabilized.   Bilateral mammogram is being scheduled for June of 2017   PATIENT CAME TODAY FURTHER FOLLOW-UP  After getting PET scan done.  Accompanied with the family. Origin to patient after switching to tamoxifen bony pains have improved but appetite remains poor  Review of system GENERAL: Feels somewhat weak tired.  Has lost approximately 10 pounds of weight since last August.  Poor appetite. PERFORMANCE STATUS (ECOG)01 HEENT:  No visual changes, runny nose, sore throat, mouth sores or tenderness. Lungs: No shortness of breath or cough.  No hemoptysis. Cardiac:  No chest pain,  palpitations, orthopnea, or PND. GI:  No nausea, vomiting, diarrhea, constipation, melena or hematochezia. GU:  No urgency, frequency, dysuria, or hematuria. Musculoskeletal:  Muscle pains.  Aches and pains. Extremities:  No pain or swelling. Skin:  No rashes or skin changes. Neuro: Neuropathy in lower extremity is getting worse Endocrine:  No diabetes, thyroid issues, hot flashes or night sweats. Psych:  No mood changes, depression or anxiety. Pain:  No focal pain. Review of systems:  All other systems reviewed and found to be negative.  As per HPI. Otherwise, a complete review of systems is negatve.  PAST MEDICAL HISTORY: Past Medical History  Diagnosis Date  . Breast cancer ([place]) 02/2012    left breast, radiation and chemotherapy  . Fatigue due to depression 06/18/2015    PAST SURGICAL HISTORY: Past Surgical History  Procedure Laterality Date  . Breast biopsy Left 12/2011    positive  . Breast excisional biopsy Left 02/2012    positive    FAMILY HISTORY Family History  Problem Relation Age of Onset  . Breast cancer Sister 56  . Breast cancer Maternal Grandmother 63    ADVANCED DIRECTIVES:  Patient does not have any living will or healthcare power of attorney.  Information was given .  Available resources had been discussed.  We will follow-up on subsequent appointments regarding this issue HEALTH MAINTENANCE: Social History  Substance Use Topics  . Smoking status: Former Research scientist (life sciences)  . Smokeless tobacco: None  . Alcohol Use: None      No Known Allergies  Current Outpatient Prescriptions  Medication Sig Dispense Refill  .  amLODipine (NORVASC) 5 MG tablet Take 1 tablet (5 mg total) by mouth daily. 30 tablet 1  . citalopram (CELEXA) 20 MG tablet Take 1 tablet (20 mg total) by mouth daily. 30 tablet 5  . hydrochlorothiazide (HYDRODIURIL) 25 MG tablet Take 1 tablet (25 mg total) by mouth daily. 30 tablet 2  . lisinopril (PRINIVIL,ZESTRIL) 10 MG tablet Take 1 tablet (10  mg total) by mouth daily. 30 tablet 1  . tamoxifen (NOLVADEX) 20 MG tablet Take 1 tablet (20 mg total) by mouth daily. 30 tablet 6   No current facility-administered medications for this visit.    OBJECTIVE:  Filed Vitals:   10/21/15 0930  BP: 137/92  Pulse: 69  Temp: 96.8 F (36 C)  Resp: 18     There is no height on file to calculate BMI.    ECOG FS:1 - Symptomatic but completely ambulatory  PHYSICAL EXAM: GENERAL:  Well developed, well nourished, sitting comfortably in the exam room in no acute distress. MENTAL STATUS:  Alert and oriented to person, place and time.  ENT:  Oropharynx clear without lesion.  Tongue normal. Mucous membranes moist.  RESPIRATORY:  Clear to auscultation without rales, wheezes or rhonchi. CARDIOVASCULAR:  Regular rate and rhythm without murmur, rub or gallop. BREAST:  Right breast without masses, skin changes or nipple discharge.  Left breast without masses, skin changes or nipple discharge. ABDOMEN:  Soft, non-tender, with active bowel sounds, and no hepatosplenomegaly.  No masses. BACK:  No CVA tenderness.  No tenderness on percussion of the back or rib cage. SKIN:  No rashes, ulcers or lesions. EXTREMITIES: No edema, no skin discoloration or tenderness.  No palpable cords. LYMPH NODES: No palpable cervical, supraclavicular, axillary or inguinal adenopathy  NEUROLOGICAL: Unremarkable. PSYCH:  Appropriate.   LAB RESULTS:  Appointment on 10/21/2015  Component Date Value Ref Range Status  . WBC 10/21/2015 5.6  3.6 - 11.0 K/uL Final  . RBC 10/21/2015 4.18  3.80 - 5.20 MIL/uL Final  . Hemoglobin 10/21/2015 13.7  12.0 - 16.0 g/dL Final  . HCT 10/21/2015 39.4  35.0 - 47.0 % Final  . MCV 10/21/2015 94.1  80.0 - 100.0 fL Final  . MCH 10/21/2015 32.7  26.0 - 34.0 pg Final  . MCHC 10/21/2015 34.8  32.0 - 36.0 g/dL Final  . RDW 10/21/2015 13.2  11.5 - 14.5 % Final  . Platelets 10/21/2015 161  150 - 440 K/uL Final  . Neutrophils Relative % 10/21/2015  61   Final  . Neutro Abs 10/21/2015 3.4  1.4 - 6.5 K/uL Final  . Lymphocytes Relative 10/21/2015 28   Final  . Lymphs Abs 10/21/2015 1.6  1.0 - 3.6 K/uL Final  . Monocytes Relative 10/21/2015 6   Final  . Monocytes Absolute 10/21/2015 0.3  0.2 - 0.9 K/uL Final  . Eosinophils Relative 10/21/2015 4   Final  . Eosinophils Absolute 10/21/2015 0.2  0 - 0.7 K/uL Final  . Basophils Relative 10/21/2015 1   Final  . Basophils Absolute 10/21/2015 0.0  0 - 0.1 K/uL Final  . Sodium 10/21/2015 136  135 - 145 mmol/L Final  . Potassium 10/21/2015 3.7  3.5 - 5.1 mmol/L Final  . Chloride 10/21/2015 102  101 - 111 mmol/L Final  . CO2 10/21/2015 26  22 - 32 mmol/L Final  . Glucose, Bld 10/21/2015 105* 65 - 99 mg/dL Final  . BUN 10/21/2015 12  6 - 20 mg/dL Final  . Creatinine, Ser 10/21/2015 0.66  0.44 - 1.00  mg/dL Final  . Calcium 10/21/2015 8.9  8.9 - 10.3 mg/dL Final  . Total Protein 10/21/2015 6.7  6.5 - 8.1 g/dL Final  . Albumin 10/21/2015 4.3  3.5 - 5.0 g/dL Final  . AST 10/21/2015 22  15 - 41 U/L Final  . ALT 10/21/2015 14  14 - 54 U/L Final  . Alkaline Phosphatase 10/21/2015 31* 38 - 126 U/L Final  . Total Bilirubin 10/21/2015 0.3  0.3 - 1.2 mg/dL Final  . GFR calc non Af Amer 10/21/2015 >60  >60 mL/min Final  . GFR calc Af Amer 10/21/2015 >60  >60 mL/min Final   Comment: (NOTE) The eGFR has been calculated using the CKD EPI equation. This calculation has not been validated in all clinical situations. eGFR's persistently <60 mL/min signify possible Chronic Kidney Disease.   . Anion gap 10/21/2015 8  5 - 15 Final   all lab data has been reviewed   ASSESSMENT: Carcinoma breast patient is on anti-hormonal therapy Anastrozole was discontinued because of significant joint pains Patient is taking tamoxifen Tolerating well.  Patient had a previous history of hysterectomy done 20 years ago  All lab data has been reviewed.  Bilateral mammogram is scheduled in June of 2017.  Evidence of  recurrent or progressive disease.  Continue tamoxifen.  Patient is an ex-smoker and was advised to get in touch with [place] next year for routine screening CT scan for lung ordeal  No matching staging information was found for the patient.  Forest Gleason, MD   10/26/2015 4:39 PM

## 2015-10-29 ENCOUNTER — Ambulatory Visit
Admission: RE | Admit: 2015-10-29 | Discharge: 2015-10-29 | Disposition: A | Discharge status: Home / Self Care | Payer: Medicare Other | Source: Ambulatory Visit | Attending: Oncology | Admitting: Oncology

## 2015-10-29 DIAGNOSIS — C50912 Malignant neoplasm of unspecified site of left female breast: Secondary | ICD-10-CM

## 2015-10-29 DIAGNOSIS — R928 Other abnormal and inconclusive findings on diagnostic imaging of breast: Secondary | ICD-10-CM | POA: Diagnosis not present

## 2015-10-29 DIAGNOSIS — Z853 Personal history of malignant neoplasm of breast: Secondary | ICD-10-CM | POA: Insufficient documentation

## 2015-11-13 ENCOUNTER — Inpatient Hospital Stay: Payer: Medicare Other | Attending: Family Medicine

## 2015-11-13 DIAGNOSIS — Z170 Estrogen receptor positive status [ER+]: Secondary | ICD-10-CM | POA: Insufficient documentation

## 2015-11-13 DIAGNOSIS — C50912 Malignant neoplasm of unspecified site of left female breast: Secondary | ICD-10-CM | POA: Diagnosis not present

## 2015-11-13 DIAGNOSIS — Z452 Encounter for adjustment and management of vascular access device: Secondary | ICD-10-CM | POA: Diagnosis not present

## 2015-11-13 DIAGNOSIS — C50919 Malignant neoplasm of unspecified site of unspecified female breast: Secondary | ICD-10-CM

## 2015-11-13 MED ORDER — HEPARIN SOD (PORK) LOCK FLUSH 100 UNIT/ML IV SOLN
500.0000 [IU] | Freq: Once | INTRAVENOUS | Status: AC
  Administered 2015-11-13: 500 [IU] via INTRAVENOUS

## 2015-11-13 MED ORDER — SODIUM CHLORIDE 0.9% FLUSH
10.0000 mL | INTRAVENOUS | Status: DC | PRN
  Administered 2015-11-13: 10 mL via INTRAVENOUS
  Filled 2015-11-13: qty 10

## 2015-11-17 ENCOUNTER — Ambulatory Visit (INDEPENDENT_AMBULATORY_CARE_PROVIDER_SITE_OTHER): Payer: Medicare Other | Admitting: Family Medicine

## 2015-11-17 ENCOUNTER — Encounter: Payer: Self-pay | Admitting: Family Medicine

## 2015-11-17 VITALS — BP 102/68 | HR 63 | Temp 98.5°F | Ht 64.5 in | Wt 173.2 lb

## 2015-11-17 DIAGNOSIS — Z1211 Encounter for screening for malignant neoplasm of colon: Secondary | ICD-10-CM

## 2015-11-17 DIAGNOSIS — G620 Drug-induced polyneuropathy: Secondary | ICD-10-CM | POA: Insufficient documentation

## 2015-11-17 DIAGNOSIS — C50919 Malignant neoplasm of unspecified site of unspecified female breast: Secondary | ICD-10-CM

## 2015-11-17 DIAGNOSIS — C50912 Malignant neoplasm of unspecified site of left female breast: Secondary | ICD-10-CM

## 2015-11-17 DIAGNOSIS — T451X5A Adverse effect of antineoplastic and immunosuppressive drugs, initial encounter: Secondary | ICD-10-CM

## 2015-11-17 DIAGNOSIS — I10 Essential (primary) hypertension: Secondary | ICD-10-CM | POA: Diagnosis not present

## 2015-11-17 DIAGNOSIS — N393 Stress incontinence (female) (male): Secondary | ICD-10-CM | POA: Insufficient documentation

## 2015-11-17 DIAGNOSIS — Z853 Personal history of malignant neoplasm of breast: Secondary | ICD-10-CM | POA: Insufficient documentation

## 2015-11-17 HISTORY — DX: Drug-induced polyneuropathy: G62.0

## 2015-11-17 HISTORY — DX: Adverse effect of antineoplastic and immunosuppressive drugs, initial encounter: T45.1X5A

## 2015-11-17 MED ORDER — LISINOPRIL 10 MG PO TABS: 10.0000 mg | ORAL_TABLET | Freq: Every day | ORAL | Status: DC

## 2015-11-17 MED ORDER — HYDROCHLOROTHIAZIDE 25 MG PO TABS: 25.0000 mg | ORAL_TABLET | Freq: Every day | ORAL | Status: DC

## 2015-11-17 MED ORDER — AMLODIPINE BESYLATE 5 MG PO TABS: 5.0000 mg | ORAL_TABLET | Freq: Every day | ORAL | Status: DC

## 2015-11-17 NOTE — Assessment & Plan Note (Signed)
Stable. Patient would like to continue to monitor.

## 2015-11-17 NOTE — Patient Instructions (Signed)
Nice to meet you. I have refilled her blood pressure medicines. Please monitor your feet on a daily basis to ensure that there are no cuts or scrapes or ulcers. Please continue follow-up with oncology. Please monitor the puffiness in the lower part of your arm and if this worsens or develops pain or redness please let us know.

## 2015-11-17 NOTE — Progress Notes (Signed)
Patient ID: Amanda Liu, female   DOB: [birth year], 60 y.o.   MRN: [id number]  Marikay Alar, MD Phone: [phone]  Amanda Liu is a 60 y.o. female who presents today for new patient visit.  HYPERTENSION Disease Monitoring Home BP Monitoring not checking  Chest pain- no    Dyspnea- no Medications Compliance-  taking amlodipine, HCTZ, and lisinopril. Lightheadedness-  no  Edema- no  Incontinence: Patient notes for many years she has leaked a small amount of urine occasionally when she coughs sneezes or laughs. Has not done Kegel exercises in a number of years. No other urinary complaints.  Neuropathy related to chemotherapy: Patient notes since being on her chemotherapy she has had numbness and tingling in the bottom of her feet. Note she did have it in her hands with this resolved. No numbness elsewhere. No weakness. Has never been on medications. She deferred treatment in the past.  Breast cancer history: Underwent lumpectomy in 2013. Chemotherapy and radiation. Had 11 lymph nodes removed from her left arm. Followed by oncology for this. Does note a small amount of puffiness distal to the elbow on the medial side and anterior aspect of arm. Initially did have some mild pain near the elbow though that is resolved. Most of the swelling has decreased and is located distally.   Active Ambulatory Problems    Diagnosis Date Noted  . Fatigue due to depression 06/18/2015  . Essential hypertension 11/17/2015  . 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 History of breast cancer in female 11/17/2015  . Stress incontinence 11/17/2015   Resolved Ambulatory Problems    Diagnosis Date Noted  . No Resolved Ambulatory Problems   Past Medical History  Diagnosis Date  . Breast cancer (HCC) 02/2012    Family History  Problem Relation Age of Onset  . Breast cancer Sister 54  . Breast cancer Maternal Grandmother 74    Social History   Social History  . Marital  Status: Married    Spouse Name: N/A  . Number of Children: N/A  . Years of Education: N/A   Occupational History  . Not on file.   Social History Main Topics  . Smoking status: Former Games developer  . Smokeless tobacco: Not on file  . Alcohol Use: No  . Drug Use: No  . Sexual Activity: Not on file   Other Topics Concern  . Not on file   Social History Narrative    ROS  General:  Negative for nexplained weight loss, fever Skin: Negative for new or changing mole, sore that won't heal HEENT: Negative for trouble hearing, trouble seeing, ringing in ears, mouth sores, hoarseness, change in voice, dysphagia. CV:  Negative for chest pain, dyspnea, edema, palpitations Resp: Negative for cough, dyspnea, hemoptysis GI: Negative for nausea, vomiting, diarrhea, constipation, abdominal pain, melena, hematochezia. GU: Positive for incontinence, Negative for dysuria, urinary hesitance, hematuria, vaginal or penile discharge, polyuria, sexual difficulty, lumps in testicle or breasts MSK: Positive for muscle cramps or aches, negative for joint pain or swelling Neuro: Positive for numbness, Negative for headaches, weakness, dizziness, passing out/fainting Psych: Negative for depression, anxiety, memory problems  Objective  Physical Exam Filed Vitals:   11/17/15 0922  BP: 102/68  Pulse: 63  Temp: 98.5 F (36.9 C)    BP Readings from Last 3 Encounters:  11/17/15 102/68  10/21/15 137/92  08/21/15 137/87   Wt Readings from Last 3 Encounters:  11/17/15 173 lb 3.2 oz (78.563 kg)  10/21/15 172 lb  13.5 oz (78.4 kg)  08/21/15 169 lb 6 oz (76.828 kg)    Physical Exam  Constitutional: She is well-developed, well-nourished, and in no distress.  HENT:  Head: Normocephalic and atraumatic.  Right Ear: External ear normal.  Left Ear: External ear normal.  Mouth/Throat: Oropharynx is clear and moist. No oropharyngeal exudate.  Eyes: Conjunctivae are normal. Pupils are equal, round, and reactive  to light.  Neck: Neck supple.  Cardiovascular: Normal rate, regular rhythm and normal heart sounds.   Pulmonary/Chest: Effort normal and breath sounds normal.  Abdominal: Soft. Bowel sounds are normal. She exhibits no distension. There is no tenderness. There is no rebound and no guarding.  Musculoskeletal: She exhibits no edema.  Lymphadenopathy:    She has no cervical adenopathy.  Neurological: She is alert. Gait normal.  Skin: Skin is warm and dry. She is not diaphoretic.  Sensation light touch intact bilateral feet, no skin breakdown or ulcerations, 2+ DP pulses  Psychiatric: Mood and affect normal.     Assessment/Plan:   Essential hypertension At goal. Continue current medications. Recent CMP reviewed.  Neuropathy due to chemotherapeutic drug ([place]) Neuropathy related to chemotherapy. Benign foot exam. She'll continue to monitor. Advised to monitor her feet closely.  History of breast cancer in female Status post lumpectomy, chemotherapy, and radiation. Doing well. Recent PET scan negative for recurrence. Followed by oncology for this. Suspect puffiness left lower forearm might be related to lymphedema. There is no tenderness. There is no cords. There is no swelling of the upper arm. She has 2+ radial pulse on that side. Discussed ice packs and continuing to monitor. If worsen she'll let us know.  Stress incontinence Stable. Patient would like to continue to monitor.    Orders Placed This Encounter  Procedures  . Ambulatory referral to Gastroenterology    Referral Priority:  Routine    Referral Type:  Consultation    Referral Reason:  Specialty Services Required    Number of Visits Requested:  1  GI referral for colonoscopy.  Meds ordered this encounter  Medications  . amLODipine (NORVASC) 5 MG tablet    Sig: Take 1 tablet (5 mg total) by mouth daily.    Dispense:  90 tablet    Refill:  3  . hydrochlorothiazide (HYDRODIURIL) 25 MG tablet    Sig: Take 1 tablet (25 mg  total) by mouth daily.    Dispense:  90 tablet    Refill:  3  . lisinopril (PRINIVIL,ZESTRIL) 10 MG tablet    Sig: Take 1 tablet (10 mg total) by mouth daily.    Dispense:  90 tablet    Refill:  [place], MD [place]

## 2015-11-17 NOTE — Progress Notes (Signed)
Pre visit review using our clinic review tool, if applicable. No additional management support is needed unless otherwise documented below in the visit note. 

## 2015-11-17 NOTE — Assessment & Plan Note (Signed)
Neuropathy related to chemotherapy. Benign foot exam. She'll continue to monitor. Advised to monitor her feet closely.

## 2015-11-17 NOTE — Assessment & Plan Note (Addendum)
Status post lumpectomy, chemotherapy, and radiation. Doing well. Recent PET scan negative for recurrence. Followed by oncology for this. Suspect puffiness left lower forearm might be related to lymphedema. There is no tenderness. There is no cords. There is no swelling of the upper arm. She has 2+ radial pulse on that side. Discussed ice packs and continuing to monitor. If worsen she'll let us know.

## 2015-11-17 NOTE — Assessment & Plan Note (Signed)
At goal. Continue current medications. Recent CMP reviewed.

## 2015-11-18 ENCOUNTER — Encounter: Payer: Self-pay | Admitting: Internal Medicine

## 2015-12-17 ENCOUNTER — Other Ambulatory Visit: Payer: Self-pay | Admitting: Family Medicine

## 2015-12-21 ENCOUNTER — Other Ambulatory Visit: Payer: Self-pay | Admitting: *Deleted

## 2015-12-21 MED ORDER — CITALOPRAM HYDROBROMIDE 20 MG PO TABS: 20.0000 mg | ORAL_TABLET | Freq: Every day | ORAL | 5 refills | Status: DC

## 2015-12-25 ENCOUNTER — Inpatient Hospital Stay: Payer: Medicare Other | Attending: Hematology and Oncology

## 2016-01-14 ENCOUNTER — Encounter: Payer: Medicare Other | Admitting: Internal Medicine

## 2016-02-05 ENCOUNTER — Inpatient Hospital Stay: Payer: Medicare Other | Attending: Hematology and Oncology

## 2016-02-05 DIAGNOSIS — R5383 Other fatigue: Secondary | ICD-10-CM | POA: Insufficient documentation

## 2016-02-05 DIAGNOSIS — K8020 Calculus of gallbladder without cholecystitis without obstruction: Secondary | ICD-10-CM | POA: Insufficient documentation

## 2016-02-05 DIAGNOSIS — C50912 Malignant neoplasm of unspecified site of left female breast: Secondary | ICD-10-CM | POA: Insufficient documentation

## 2016-02-05 DIAGNOSIS — M2550 Pain in unspecified joint: Secondary | ICD-10-CM | POA: Insufficient documentation

## 2016-02-05 DIAGNOSIS — Z79899 Other long term (current) drug therapy: Secondary | ICD-10-CM | POA: Insufficient documentation

## 2016-02-05 DIAGNOSIS — Z170 Estrogen receptor positive status [ER+]: Secondary | ICD-10-CM | POA: Insufficient documentation

## 2016-02-05 DIAGNOSIS — Z803 Family history of malignant neoplasm of breast: Secondary | ICD-10-CM | POA: Insufficient documentation

## 2016-02-05 DIAGNOSIS — M8580 Other specified disorders of bone density and structure, unspecified site: Secondary | ICD-10-CM | POA: Insufficient documentation

## 2016-02-05 DIAGNOSIS — C779 Secondary and unspecified malignant neoplasm of lymph node, unspecified: Secondary | ICD-10-CM | POA: Insufficient documentation

## 2016-02-05 DIAGNOSIS — Z87891 Personal history of nicotine dependence: Secondary | ICD-10-CM | POA: Insufficient documentation

## 2016-02-05 DIAGNOSIS — F329 Major depressive disorder, single episode, unspecified: Secondary | ICD-10-CM | POA: Insufficient documentation

## 2016-02-05 DIAGNOSIS — Z79810 Long term (current) use of selective estrogen receptor modulators (SERMs): Secondary | ICD-10-CM | POA: Insufficient documentation

## 2016-02-05 DIAGNOSIS — Z9012 Acquired absence of left breast and nipple: Secondary | ICD-10-CM | POA: Insufficient documentation

## 2016-02-17 ENCOUNTER — Other Ambulatory Visit: Payer: Self-pay | Admitting: *Deleted

## 2016-02-17 ENCOUNTER — Encounter (INDEPENDENT_AMBULATORY_CARE_PROVIDER_SITE_OTHER): Payer: Self-pay

## 2016-02-17 ENCOUNTER — Ambulatory Visit (INDEPENDENT_AMBULATORY_CARE_PROVIDER_SITE_OTHER): Payer: Medicare Other | Admitting: Family Medicine

## 2016-02-17 ENCOUNTER — Encounter: Payer: Self-pay | Admitting: Family Medicine

## 2016-02-17 VITALS — BP 118/80 | HR 75 | Temp 98.6°F | Ht 64.5 in | Wt 178.4 lb

## 2016-02-17 DIAGNOSIS — J309 Allergic rhinitis, unspecified: Secondary | ICD-10-CM | POA: Insufficient documentation

## 2016-02-17 DIAGNOSIS — R251 Tremor, unspecified: Secondary | ICD-10-CM

## 2016-02-17 DIAGNOSIS — N951 Menopausal and female climacteric states: Secondary | ICD-10-CM

## 2016-02-17 DIAGNOSIS — Z853 Personal history of malignant neoplasm of breast: Secondary | ICD-10-CM

## 2016-02-17 DIAGNOSIS — R232 Flushing: Secondary | ICD-10-CM | POA: Insufficient documentation

## 2016-02-17 NOTE — Progress Notes (Signed)
Pre visit review using our clinic review tool, if applicable. No additional management support is needed unless otherwise documented below in the visit note. 

## 2016-02-17 NOTE — Assessment & Plan Note (Signed)
Could be postmenopausal related to tamoxifen. She has been off tamoxifen for the past month soda expected to improve though have her discuss that with her oncologist on Friday. Could also be related to Celexa. We'll taper off Celexa as well. We'll check a TSH for this. She'll continue to monitor.

## 2016-02-17 NOTE — Progress Notes (Signed)
  Tommi Rumps, MD Phone: [phone]  Amanda Liu is a 60 y.o. female who presents today for follow-up.  Tremor: Patient notes over last several weeks she's had left hand tremor worse than right hand tremor. Hasn't happened recently. Lasts for about 10 seconds. Only occurs when she is trying to do something with each individual hand. No new numbness or weakness. Has residual numbness in her left upper arm related to her axillary lymph node dissection. No family history of Parkinson's or tremor.  Hot flashes: Patient is postmenopausal. Has been on tamoxifen for breast cancer though stopped this one month ago on her own to see if that would benefit the hot flashes. Notes this has not helped. Notes most afternoons she feels hot and then feels exhausted. Doesn't matter what she's doing. Gets better within 2-3 hours. Does not occur at any other time. No chest pain or shortness of breath with this. She is on Celexa though is not depressed.  Allergic rhinitis: Patient notes a long history of postnasal drip, cough productive of clear mucus, intermittent congestion, and sneezing. No rhinorrhea. No ear symptoms. Notes allergy medications over-the-counter to help.  PMH: Former smoker ROS see history of present illness  Objective  Physical Exam Vitals:   02/17/16 0906  BP: 118/80  Pulse: 75  Temp: 98.6 F (37 C)    BP Readings from Last 3 Encounters:  02/17/16 118/80  11/17/15 102/68  10/21/15 (!) 137/92   Wt Readings from Last 3 Encounters:  02/17/16 178 lb 6.4 oz (80.9 kg)  11/17/15 173 lb 3.2 oz (78.6 kg)  10/21/15 172 lb 13.5 oz (78.4 kg)    Physical Exam  Constitutional: No distress.  HENT:  Head: Normocephalic and atraumatic.  Mouth/Throat: Oropharynx is clear and moist. No oropharyngeal exudate.  Eyes: Conjunctivae are normal. Pupils are equal, round, and reactive to light.  Cardiovascular: Normal rate, regular rhythm and normal heart sounds.   Pulmonary/Chest: Effort  normal and breath sounds normal.  Musculoskeletal: She exhibits no edema.  Neurological: She is alert.  CN 2-12 intact, 5/5 strength in bilateral biceps, triceps, grip, quads, hamstrings, plantar and dorsiflexion, stable decreased light touch sensation in left upper arm, otherwise sensation to light touch intact in bilateral UE and LE, normal gait, 2+ patellar reflexes, normal finger to nose, no tremor  Skin: Skin is warm and dry. She is not diaphoretic.  Psychiatric: Mood and affect normal.     Assessment/Plan: Please see individual problem list.  Tremor Could be consistent with benign essential tremor versus related to medication such as Celexa. She is neurologically intact and has no evidence of tremor today. We will check a TSH. We will taper her off of her Celexa as she is unsure if this is of benefit. She'll continue to monitor and if worsen she'll let us know.  Hot flashes Could be postmenopausal related to tamoxifen. She has been off tamoxifen for the past month soda expected to improve though have her discuss that with her oncologist on Friday. Could also be related to Celexa. We'll taper off Celexa as well. We'll check a TSH for this. She'll continue to monitor.  Allergic rhinitis I encouraged consistent use of Zyrtec or Claritin.   Orders Placed This Encounter  Procedures  . TSH    Standing Status:   Future    Standing Expiration Date:   02/16/2017   Tommi Rumps, MD [place]

## 2016-02-17 NOTE — Patient Instructions (Addendum)
Nice to see you. Her hot flashes and tremor could be related to the Celexa. We will have you taper off of this as outline below. Celexa 10 mg (half a tablet) by mouth daily for 2 weeks, then take Celexa 10 mg (half a tablet) by mouth every other day for 2 weeks, then discontinue. You should consistently take Claritin or Zyrtec for your allergies.

## 2016-02-17 NOTE — Assessment & Plan Note (Signed)
Could be consistent with benign essential tremor versus related to medication such as Celexa. She is neurologically intact and has no evidence of tremor today. We will check a TSH. We will taper her off of her Celexa as she is unsure if this is of benefit. She'll continue to monitor and if worsen she'll let us know.

## 2016-02-17 NOTE — Assessment & Plan Note (Signed)
I encouraged consistent use of Zyrtec or Claritin.

## 2016-02-19 ENCOUNTER — Inpatient Hospital Stay: Payer: Medicare Other

## 2016-02-19 ENCOUNTER — Encounter: Payer: Self-pay | Admitting: Hematology and Oncology

## 2016-02-19 ENCOUNTER — Inpatient Hospital Stay (HOSPITAL_BASED_OUTPATIENT_CLINIC_OR_DEPARTMENT_OTHER): Payer: Medicare Other | Admitting: Hematology and Oncology

## 2016-02-19 ENCOUNTER — Other Ambulatory Visit: Payer: Self-pay | Admitting: *Deleted

## 2016-02-19 VITALS — BP 128/84 | HR 70 | Temp 98.6°F | Ht 65.0 in | Wt 175.9 lb

## 2016-02-19 DIAGNOSIS — R5383 Other fatigue: Secondary | ICD-10-CM

## 2016-02-19 DIAGNOSIS — K8020 Calculus of gallbladder without cholecystitis without obstruction: Secondary | ICD-10-CM

## 2016-02-19 DIAGNOSIS — Z87891 Personal history of nicotine dependence: Secondary | ICD-10-CM

## 2016-02-19 DIAGNOSIS — M8580 Other specified disorders of bone density and structure, unspecified site: Secondary | ICD-10-CM

## 2016-02-19 DIAGNOSIS — Z79810 Long term (current) use of selective estrogen receptor modulators (SERMs): Secondary | ICD-10-CM

## 2016-02-19 DIAGNOSIS — C779 Secondary and unspecified malignant neoplasm of lymph node, unspecified: Secondary | ICD-10-CM | POA: Diagnosis not present

## 2016-02-19 DIAGNOSIS — Z170 Estrogen receptor positive status [ER+]: Secondary | ICD-10-CM | POA: Diagnosis not present

## 2016-02-19 DIAGNOSIS — C50912 Malignant neoplasm of unspecified site of left female breast: Secondary | ICD-10-CM | POA: Diagnosis not present

## 2016-02-19 DIAGNOSIS — F329 Major depressive disorder, single episode, unspecified: Secondary | ICD-10-CM | POA: Diagnosis not present

## 2016-02-19 DIAGNOSIS — Z9012 Acquired absence of left breast and nipple: Secondary | ICD-10-CM | POA: Diagnosis not present

## 2016-02-19 DIAGNOSIS — R5382 Chronic fatigue, unspecified: Secondary | ICD-10-CM

## 2016-02-19 DIAGNOSIS — Z95828 Presence of other vascular implants and grafts: Secondary | ICD-10-CM

## 2016-02-19 DIAGNOSIS — Z803 Family history of malignant neoplasm of breast: Secondary | ICD-10-CM

## 2016-02-19 DIAGNOSIS — M2550 Pain in unspecified joint: Secondary | ICD-10-CM | POA: Diagnosis not present

## 2016-02-19 DIAGNOSIS — Z853 Personal history of malignant neoplasm of breast: Secondary | ICD-10-CM

## 2016-02-19 DIAGNOSIS — Z79899 Other long term (current) drug therapy: Secondary | ICD-10-CM

## 2016-02-19 DIAGNOSIS — R251 Tremor, unspecified: Secondary | ICD-10-CM

## 2016-02-19 LAB — COMPREHENSIVE METABOLIC PANEL
ALT: 15 U/L (ref 14–54)
AST: 22 U/L (ref 15–41)
Albumin: 4.1 g/dL (ref 3.5–5.0)
Alkaline Phosphatase: 31 U/L — ABNORMAL LOW (ref 38–126)
Anion gap: 9 (ref 5–15)
BUN: 10 mg/dL (ref 6–20)
CO2: 27 mmol/L (ref 22–32)
Calcium: 8.9 mg/dL (ref 8.9–10.3)
Chloride: 101 mmol/L (ref 101–111)
Creatinine, Ser: 0.57 mg/dL (ref 0.44–1.00)
GFR calc Af Amer: >60 mL/min (ref >60)
GFR calc non Af Amer: >60 mL/min (ref >60)
Glucose, Bld: 110 mg/dL — ABNORMAL HIGH (ref 65–99)
Potassium: 3.3 mmol/L — ABNORMAL LOW (ref 3.5–5.1)
Sodium: 137 mmol/L (ref 135–145)
Total Bilirubin: 0.8 mg/dL (ref 0.3–1.2)
Total Protein: 6.8 g/dL (ref 6.5–8.1)

## 2016-02-19 LAB — CBC WITH DIFFERENTIAL/PLATELET
Basophils Absolute: 0.1 10*3/uL (ref 0–0.1)
Basophils Relative: 1 %
Eosinophils Absolute: 0.2 10*3/uL (ref 0–0.7)
Eosinophils Relative: 4 %
HCT: 38.9 % (ref 35.0–47.0)
Hemoglobin: 13.4 g/dL (ref 12.0–16.0)
Lymphocytes Relative: 23 %
Lymphs Abs: 1.4 10*3/uL (ref 1.0–3.6)
MCH: 32.6 pg (ref 26.0–34.0)
MCHC: 34.4 g/dL (ref 32.0–36.0)
MCV: 94.9 fL (ref 80.0–100.0)
Monocytes Absolute: 0.3 10*3/uL (ref 0.2–0.9)
Monocytes Relative: 6 %
Neutro Abs: 3.9 10*3/uL (ref 1.4–6.5)
Neutrophils Relative %: 66 %
Platelets: 161 10*3/uL (ref 150–440)
RBC: 4.1 MIL/uL (ref 3.80–5.20)
RDW: 12.8 % (ref 11.5–14.5)
WBC: 5.9 10*3/uL (ref 3.6–11.0)

## 2016-02-19 LAB — TSH: TSH: 2.344 u[IU]/mL (ref 0.350–4.500)

## 2016-02-19 MED ORDER — SODIUM CHLORIDE 0.9% FLUSH
10.0000 mL | INTRAVENOUS | Status: DC | PRN
  Administered 2016-02-19: 10 mL via INTRAVENOUS
  Filled 2016-02-19: qty 10

## 2016-02-19 MED ORDER — HEPARIN SOD (PORK) LOCK FLUSH 100 UNIT/ML IV SOLN
500.0000 [IU] | Freq: Once | INTRAVENOUS | Status: AC
  Administered 2016-02-19: 500 [IU] via INTRAVENOUS

## 2016-02-19 NOTE — Progress Notes (Signed)
Patient here for follow up. She has not been taking her tamoxifin  Since last summer. She plans on getting it refilled today.

## 2016-02-19 NOTE — Progress Notes (Signed)
[facility] day:  02/19/2016  Chief Complaint: Amanda Liu is a 59 y.o. female with stage IIIB left breast cancer who is seen for reassessment.  HPI:  The patient presented in 2013 with a palpable/visible mass.  Mammogram on 07/19/2011 revealed a 3 x 2.6 cm irregular mass in the superior left breast at the 12:00 position. There were microcalcifications associated with the mass.   PET scan on 08/09/2011 revealed a hypermetabolic mass in the left breast consistent with patient's known malignancy. There was mild hypermetabolic activity in the left axillary lymph nodes. There was no evidence of metastatic disease. There were indeterminate pulmonary nodules 5 mm or less below the resolution of PET.  Left breast biopsy on 08/16/2011 revealed invasive mammary carcinoma.  Estrogen receptor was positive (> 90%) progesterone receptor positive (> 90%) and HER-2/neu negative.  She received neoadjuvant chemotherapy consisting of AC x 4 every 3 weeks (08/31/2011 - 11/02/2011) and weekly Taxol x 12 (11/23/2011 - 02/08/2012).  She tolerated her chemotherapy well.  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  She underwent left partial mastectomy and sentinel lymph node biopsy by Dr. Rochel Brome on 03/16/2012.  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  Tumor invaded the dermis without skin ulceration. 5 of 11 lymph nodes were positive for macro metastasis.  Pathologic stage was ypT2N2a (stage IIIB).  She received radiation from 03/2012 - 05/2012.    She was on Armidex for 2 1/2 years beginning 05/2012.  She developed bone pain and fatigue.  She states that she was taken off Arimidex for 30 days beginning 12/08/2014.  She was prescribed Aromasin, but did not take as it cost too much.  Tamoxifen was started in 12/2014.    She states that over time, she feels bad with hormonal therapy.   She describes not being  able to go in the middle of the afternoon (fatigue).  She is "good in AM".  She feels "achy" and her joints are "yuck".    She recently came off tamoxifen for 3 1/2 weeks.  She noticed that it didn't change anything.  Dr. Caryl Bis is checking thyroid function tests and "looking for something else".  She denies any history of sleep apnea.  She notes that she looks at [employer] and thinks of things need to do.   Bone density study on 10/28/2014 revealed osteopenia with a  score of -1.4 in the left femoral neck.  PET scan on 06/17/2015 revealed no evidence of recurrent or metastatic disease. She had some cholelithiasis. 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  CA27.29 was < 3.5 on 02/05/2014, 12.9 on 08/27/2014, 7.8 on 01/07/2015, and 4.4 on 06/11/2015.  The patient was last seen in the medical oncology clinic on 10/21/2015 by Dr. Oliva Bustard.  At that time, she was tolerating tamoxifen well.  Weight loss had stabilized.  She states that she was put on Celexa and her appetite came back.  She stats that she is not depressed.  She is now taking 1/2 tablet for 2 weeks then 1/2 QOD x 2 weeks (taper).  Bilateral mammogram on 10/29/2015 revealed no evidence of malignancy.  BRCA1/2 testing was negative.  Past Medical History:  Diagnosis Date  . Allergic rhinitis   . Breast cancer ([place]) 02/2012   left breast lumpectomy, radiation and chemotherapy  . Chickenpox   . Elevated blood pressure   . Fatigue due to depression 06/18/2015  .  UTI (lower urinary tract infection)     Past Surgical History:  Procedure Laterality Date  . ABDOMINAL HYSTERECTOMY    . BREAST BIOPSY Left 12/2011   positive  . BREAST EXCISIONAL BIOPSY Left 02/2012   positive  . TONSILLECTOMY      Family History  Problem Relation Age of Onset  . Breast cancer Sister 88  . Breast cancer Maternal Grandmother 80  . Alcoholism      Parent    Social History:  reports that she has quit smoking. She does not have any  smokeless tobacco history on file. She reports that she does not drink alcohol or use drugs.  The patient is alone today.  Allergies: No Known Allergies  Current Medications: Current Outpatient Prescriptions  Medication Sig Dispense Refill  . amLODipine (NORVASC) 5 MG tablet Take 1 tablet (5 mg total) by mouth daily. 90 tablet 3  . citalopram (CELEXA) 20 MG tablet Take 1 tablet (20 mg total) by mouth daily. 30 tablet 5  . hydrochlorothiazide (HYDRODIURIL) 25 MG tablet Take 1 tablet (25 mg total) by mouth daily. 90 tablet 3  . lisinopril (PRINIVIL,ZESTRIL) 10 MG tablet Take 1 tablet (10 mg total) by mouth daily. 90 tablet 3  . tamoxifen (NOLVADEX) 20 MG tablet Take 1 tablet (20 mg total) by mouth daily. (Patient not taking: Reported on 02/19/2016) 30 tablet 6   No current facility-administered medications for this visit.    Facility-Administered Medications Ordered in Other Visits  Medication Dose Route Frequency Provider Last Rate Last Dose  . sodium chloride flush (NS) 0.9 % injection 10 mL  10 mL Intravenous PRN Lequita Asal, MD   10 mL at 02/19/16 1001    Review of Systems:  GENERAL:  Feels "ggod in the morning and fatigued in afternoon".  No fevers, sweats or weight loss. PERFORMANCE STATUS (ECOG):  1 HEENT:  No visual changes, runny nose, sore throat, mouth sores or tenderness. Lungs: No shortness of breath or cough.  No hemoptysis. Cardiac:  No chest pain, palpitations, orthopnea, or PND. GI:  No nausea, vomiting, diarrhea, constipation, melena or hematochezia. GU:  No urgency, frequency, dysuria, or hematuria. Musculoskeletal:  No back pain.  Achy joints on hormonal therapy.  No muscle tenderness. Extremities:  No pain or swelling. Skin:  No rashes or skin changes. Neuro:  No headache, numbness or weakness, balance or coordination issues. Endocrine:  No diabetes, thyroid issues, hot flashes or night sweats. Psych:  No mood changes, depression or anxiety. Pain:  No focal  pain. Review of systems:  All other systems reviewed and found to be negative.  Physical Exam: Blood pressure 128/84, pulse 70, temperature 98.6 F (37 C), temperature source Oral, height _0  (1.651 m), weight 175 lb 14.8 oz (79.8 kg), SpO2 97 %. GENERAL:  Well developed, well nourished, sitting comfortably in the exam room in no acute distress. MENTAL STATUS:  Alert and oriented to person, place and time. HEAD:  Short white/gray hair.  Normocephalic, atraumatic, face symmetric, no Cushingoid features. EYES:  Glasses.  Blue eyes.  Pupils equal round and reactive to light and accomodation.  No conjunctivitis or scleral icterus. ENT:  Oropharynx clear without lesion.  Tongue normal. Mucous membranes moist.  RESPIRATORY:  Clear to auscultation without rales, wheezes or rhonchi. CARDIOVASCULAR:  Regular rate and rhythm without murmur, rub or gallop. ABDOMEN:  Soft, non-tender, with active bowel sounds, and no hepatosplenomegaly.  No masses. BREAST:  Right breast without masses, skin changes or nipple discharge.  Smaller left breast.  Left breast with incision above nipple.  No discrete masses, skin changes or nipple discharge.  SKIN:  No rashes, ulcers or lesions. EXTREMITIES: No edema, no skin discoloration or tenderness.  No palpable cords. LYMPH NODES: No palpable cervical, supraclavicular, axillary or inguinal adenopathy  NEUROLOGICAL: Unremarkable. PSYCH:  Appropriate.   Appointment on 02/19/2016  Component Date Value Ref Range Status  . WBC 02/19/2016 5.9  3.6 - 11.0 K/uL Final  . RBC 02/19/2016 4.10  3.80 - 5.20 MIL/uL Final  . Hemoglobin 02/19/2016 13.4  12.0 - 16.0 g/dL Final  . HCT 02/19/2016 38.9  35.0 - 47.0 % Final  . MCV 02/19/2016 94.9  80.0 - 100.0 fL Final  . MCH 02/19/2016 32.6  26.0 - 34.0 pg Final  . MCHC 02/19/2016 34.4  32.0 - 36.0 g/dL Final  . RDW 02/19/2016 12.8  11.5 - 14.5 % Final  . Platelets 02/19/2016 161  150 - 440 K/uL Final  . Neutrophils Relative %  02/19/2016 66  % Final  . Neutro Abs 02/19/2016 3.9  1.4 - 6.5 K/uL Final  . Lymphocytes Relative 02/19/2016 23  % Final  . Lymphs Abs 02/19/2016 1.4  1.0 - 3.6 K/uL Final  . Monocytes Relative 02/19/2016 6  % Final  . Monocytes Absolute 02/19/2016 0.3  0.2 - 0.9 K/uL Final  . Eosinophils Relative 02/19/2016 4  % Final  . Eosinophils Absolute 02/19/2016 0.2  0 - 0.7 K/uL Final  . Basophils Relative 02/19/2016 1  % Final  . Basophils Absolute 02/19/2016 0.1  0 - 0.1 K/uL Final  . Sodium 02/19/2016 137  135 - 145 mmol/L Final  . Potassium 02/19/2016 3.3* 3.5 - 5.1 mmol/L Final  . Chloride 02/19/2016 101  101 - 111 mmol/L Final  . CO2 02/19/2016 27  22 - 32 mmol/L Final  . Glucose, Bld 02/19/2016 110* 65 - 99 mg/dL Final  . BUN 02/19/2016 10  6 - 20 mg/dL Final  . Creatinine, Ser 02/19/2016 0.57  0.44 - 1.00 mg/dL Final  . Calcium 02/19/2016 8.9  8.9 - 10.3 mg/dL Final  . Total Protein 02/19/2016 6.8  6.5 - 8.1 g/dL Final  . Albumin 02/19/2016 4.1  3.5 - 5.0 g/dL Final  . AST 02/19/2016 22  15 - 41 U/L Final  . ALT 02/19/2016 15  14 - 54 U/L Final  . Alkaline Phosphatase 02/19/2016 31* 38 - 126 U/L Final  . Total Bilirubin 02/19/2016 0.8  0.3 - 1.2 mg/dL Final  . GFR calc non Af Amer 02/19/2016 >60  >60 mL/min Final  . GFR calc Af Amer 02/19/2016 >60  >60 mL/min Final   Comment: (NOTE) The eGFR has been calculated using the CKD EPI equation. This calculation has not been validated in all clinical situations. eGFR's persistently <60 mL/min signify possible Chronic Kidney Disease.   . Anion gap 02/19/2016 9  5 - 15 Final    Assessment:  Amanda Liu is a 60 y.o. female with stage IIIB left breast cancer s/p neoadjuvant chemotherapy, surgery, radiation, and ongoing hormonal therapy.  Mammogram on 07/19/2011 revealed a 3 x 2.6 cm irregular mass in the superior left breast at the 12:00 position. There were microcalcifications associated with the mass.  BRCA1/2 testing was  negative.  PET scan on 08/09/2011 revealed a hypermetabolic mass in the left breast consistent with patient's known malignancy. There was mild hypermetabolic activity in the left axillary lymph nodes. There was no evidence of metastatic disease. There were indeterminate  pulmonary nodules 5 mm or less below the resolution of PET.  Left breast biopsy on 08/16/2011 revealed invasive mammary carcinoma.  Estrogen receptor was positive (> 90%) progesterone receptor positive (> 90%) and HER-2/neu negative.  She received neoadjuvant AC x 4 every 3 weeks (08/31/2011 - 11/02/2011) and weekly Taxol x 12 (11/23/2011 - 02/08/2012).  She developed a Taxol induced neuropathy.  She underwent left partial mastectomy and sentinel lymph node biopsy on 03/16/2012.  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  Tumor invaded the dermis without skin ulceration. 5 of 11 lymph nodes were positive for macro metastasis. Pathologic stage was ypT2N2a (stage IIIB).  She received radiation from 03/2012 - 05/2012.    She was on Arimidex for 2 1/2 years beginning 05/2012.  She developed bone pain and fatigue.  She was taken off Arimidex for 30 days beginning 12/08/2014.  She was prescribed Aromasin, but did not take as it was cost prohibitive.  Tamoxifen was started in 12/2014.    PET scan on 06/17/2015 revealed no evidence of recurrent or metastatic disease.   Bilateral mammogram on 10/29/2015 revealed no evidence of malignancy.  CA27.29 was < 3.5 on 02/05/2014, 12.9 on 08/27/2014, 7.8 on 01/07/2015, and 4.4 on 06/11/2015.  Bone density study on 10/28/2014 revealed osteopenia with a  score of -1.4 in the left femoral neck.  Symptomatically, she denies any new complaints.  Exam reveals post surgical changes.  She was off tamoxifen for 3 1/2 weeks.  She is tapering Celexa.  Plan: 1.  Review entire medical history, diagnosis and management of breast cancer.  Discuss hormonal therapy. 2.   Labs today:  CBC with diff, CMP, CA27.29, CA15-3. 3.  Continue tamoxifen. 4.  Discuss consideration of BCI testing re: benefit of extended adjuvant hormonal therapy. 5.  Next mammogram 10/28/2016. 6.  Port flush every 6 weeks. 7.  RTC in 4 month for MD assess and labs (CBC with diff, CMP, CA27.29).   Lequita Asal, MD  02/19/2016, 10:48 AM

## 2016-02-20 ENCOUNTER — Encounter: Payer: Self-pay | Admitting: Hematology and Oncology

## 2016-02-20 LAB — CANCER ANTIGEN 15-3: CA 15-3: 5.6 U/mL (ref 0.0–25.0)

## 2016-02-20 LAB — CANCER ANTIGEN 27.29: CA 27.29: 6.6 U/mL (ref 0.0–38.6)

## 2016-02-26 ENCOUNTER — Other Ambulatory Visit: Payer: Self-pay | Admitting: Family Medicine

## 2016-02-26 DIAGNOSIS — E876 Hypokalemia: Secondary | ICD-10-CM

## 2016-03-11 ENCOUNTER — Other Ambulatory Visit (INDEPENDENT_AMBULATORY_CARE_PROVIDER_SITE_OTHER): Payer: Medicare Other

## 2016-03-11 DIAGNOSIS — E876 Hypokalemia: Secondary | ICD-10-CM

## 2016-03-11 LAB — POTASSIUM: Potassium: 3.9 mEq/L (ref 3.5–5.1)

## 2016-03-18 ENCOUNTER — Ambulatory Visit (INDEPENDENT_AMBULATORY_CARE_PROVIDER_SITE_OTHER): Payer: Medicare Other | Admitting: Family Medicine

## 2016-03-18 ENCOUNTER — Encounter: Payer: Self-pay | Admitting: Family Medicine

## 2016-03-18 VITALS — BP 144/84 | HR 71

## 2016-03-18 DIAGNOSIS — R0602 Shortness of breath: Secondary | ICD-10-CM | POA: Diagnosis not present

## 2016-03-18 DIAGNOSIS — R5383 Other fatigue: Secondary | ICD-10-CM

## 2016-03-18 DIAGNOSIS — Z23 Encounter for immunization: Secondary | ICD-10-CM

## 2016-03-18 DIAGNOSIS — I10 Essential (primary) hypertension: Secondary | ICD-10-CM

## 2016-03-18 DIAGNOSIS — M898X9 Other specified disorders of bone, unspecified site: Secondary | ICD-10-CM

## 2016-03-18 NOTE — Patient Instructions (Signed)
Nice to see you. We're going to refer you to cardiology for evaluation of your fatigue. Please monitor your bone pain. If you develop chest pain, shortness of breath, worsening fatigue, or any new or changing symptoms please seek medical attention immediately.

## 2016-03-18 NOTE — Assessment & Plan Note (Addendum)
Patient continues to have issues with fatigue. Could potentially be related to her tamoxifen though she reports some exertional components today. EKG was obtained and is overall reassuring. Given exertional components we will refer to cardiology for further evaluation. Additionally Epworth Sleepiness Scale was obtained revealing a score of 5 making it unlikely that she has abnormally sleepy. If cardiac evaluation negative could consider sleep study in the future. Given return precautions.

## 2016-03-18 NOTE — Progress Notes (Signed)
Amanda Rumps, MD Phone: [phone]  Amanda Liu is a 60 y.o. female who presents today for follow-up.  Fatigue: Patient notes continuing to have issues with this. Notes it's total exhaustion. Notes it is worse when she rushes and has increased physical activity. She does note some exertional shortness of breath at times. No chest pain. Notes her exhaustion comes and goes throughout the day. Notes she does wake up tired. No apnea or snoring. Does not fall sleep easily during the day. Recent lab work negative for cause. Denies depression.  She notes continued issues with bone pain. Notes she first experienced this after being treated with anastrozole. Notes it improved with coming off of that. Has started to occur again now with tamoxifen. Starts in her hips and moves down to her knees. Is daily and intermittent. 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 No injury to her hips.  Hypertension: Patient checks and is always in the normal range. Takes lisinopril, HCTZ, amlodipine. No chest pain. Some shortness of breath as outlined above. No edema.  PMH: Former smoker   ROS see history of present illness  Objective  Physical Exam Vitals:   03/18/16 0852  BP: (!) 144/84  Pulse: 71    BP Readings from Last 3 Encounters:  03/18/16 (!) 144/84  02/19/16 128/84  02/17/16 118/80   Wt Readings from Last 3 Encounters:  02/19/16 175 lb 14.8 oz (79.8 kg)  02/17/16 178 lb 6.4 oz (80.9 kg)  11/17/15 173 lb 3.2 oz (78.6 kg)    Physical Exam  Constitutional: She is well-developed, well-nourished, and in no distress.  Cardiovascular: Normal rate, regular rhythm and normal heart sounds.   Pulmonary/Chest: Effort normal and breath sounds normal.  Musculoskeletal: She exhibits no edema.  Left hip greater trochanter mild tenderness, no other tenderness in her left or right thighs or hips, no pain on internal or  external range of motion in bilateral hips  Neurological: She is alert. Gait normal.  Skin: Skin is warm and dry.   EKG: Normal sinus rhythm, rate 60, T-wave inversion V3 though otherwise no ST or T-wave changes  Assessment/Plan: Please see individual problem list.  Fatigue Patient continues to have issues with fatigue. Could potentially be related to her tamoxifen though she reports some exertional components today. EKG was obtained and is overall reassuring. Given exertional components we will refer to cardiology for further evaluation. Additionally Epworth Sleepiness Scale was obtained revealing a score of 5 making it unlikely that she has abnormally sleepy. If cardiac evaluation negative could consider sleep study in the future. Given return precautions.  Bone pain Patient reports what she describes as bone pain starting in her bilateral hips moving around her bilateral knees. She has had this previously with anastrozole and reports it started sometime after starting tamoxifen. She has a relatively benign exam. I discussed obtaining x-rays though she deferred this at this time. She'll continue to monitor.  Essential hypertension Slightly above goal today. Possibly related to some anxiety. Discussed monitoring at home and letting us know what it is in several weeks. She is going to see cardiology and will have it rechecked at that time. If she doesn't see them in the next several weeks to a month she'll call us for nurse visit to recheck.   Orders Placed This Encounter  Procedures  . Flu Vaccine QUAD 36+ mos IM  . Ambulatory referral to Cardiology    Referral Priority:  Routine    Referral Type:   Consultation    Referral Reason:   Specialty Services Required    Requested Specialty:   Cardiology    Number of Visits Requested:   1  . EKG 12-Lead    Amanda Rumps, MD [name]

## 2016-03-18 NOTE — Assessment & Plan Note (Addendum)
Patient reports what she describes as bone pain starting in her bilateral hips moving around her bilateral knees. She has had this previously with anastrozole and reports it started sometime after starting tamoxifen. She has a relatively benign exam. I discussed obtaining x-rays though she deferred this at this time. She'll continue to monitor.

## 2016-03-18 NOTE — Assessment & Plan Note (Signed)
Slightly above goal today. Possibly related to some anxiety. Discussed monitoring at home and letting us know what it is in several weeks. She is going to see cardiology and will have it rechecked at that time. If she doesn't see them in the next several weeks to a month she'll call us for nurse visit to recheck.

## 2016-03-28 ENCOUNTER — Telehealth: Payer: Self-pay | Admitting: Family Medicine

## 2016-03-28 NOTE — Telephone Encounter (Signed)
I called pt and left vm to call office to sch AWV. Thank you!

## 2016-04-01 ENCOUNTER — Inpatient Hospital Stay: Payer: Medicare Other | Attending: Hematology and Oncology

## 2016-04-05 ENCOUNTER — Ambulatory Visit: Payer: Medicare Other | Admitting: Cardiology

## 2016-04-13 ENCOUNTER — Encounter (INDEPENDENT_AMBULATORY_CARE_PROVIDER_SITE_OTHER): Payer: Self-pay

## 2016-04-13 ENCOUNTER — Encounter: Payer: Self-pay | Admitting: Cardiology

## 2016-04-13 ENCOUNTER — Ambulatory Visit (INDEPENDENT_AMBULATORY_CARE_PROVIDER_SITE_OTHER): Payer: Medicare Other | Admitting: Cardiology

## 2016-04-13 VITALS — BP 110/82 | HR 76 | Ht 65.5 in | Wt 179.5 lb

## 2016-04-13 DIAGNOSIS — I10 Essential (primary) hypertension: Secondary | ICD-10-CM

## 2016-04-13 DIAGNOSIS — R5383 Other fatigue: Secondary | ICD-10-CM | POA: Diagnosis not present

## 2016-04-13 DIAGNOSIS — R0602 Shortness of breath: Secondary | ICD-10-CM | POA: Diagnosis not present

## 2016-04-13 NOTE — Progress Notes (Signed)
Cardiology Office Note   Date:  04/13/2016   ID:  Amanda Liu, DOB 1955-10-31, MRN [id number]  Referring Doctor:  Tommi Rumps, MD   Cardiologist:   Wende Bushy, MD   Reason for consultation:  Chief Complaint  Patient presents with  . other    Ref by Caryl Bis for fatigue& shortness of breath; pt. has been taking a preventable drug for cancer, she is s/p breast cancer 5 years ago, not sure if medication causing fatigue & shortness of breath or is it cardiac issues.       History of Present Illness: Amanda Liu is a 60 y.o. female who presents for Shortness of breath. Also complains of significant fatigue. These has been going on for several months now. She believes it is related to when she first started taking tamoxifen. Shortness of breath is with exertion, resolved with rest. Moderate in intensity, lasting minutes at a time. Nonradiating. No associated chest pains.  Diagnosis with breast cancer several years ago. Underwent 16 cycles of chemotherapy including Adriamycin per patient report. Also radiation therapy 38 sessions.  Patient denies orthopnea, PND, edema. No palpitations. No loss of consciousness.   ROS:  Please see the history of present illness. Aside from mentioned under HPI, all other systems are reviewed and negative.     Past Medical History:  Diagnosis Date  . Allergic rhinitis   . Breast cancer ([place]) 02/2012   left breast lumpectomy, radiation and chemotherapy  . Chickenpox   . Elevated blood pressure   . Fatigue due to depression 06/18/2015  . UTI (lower urinary tract infection)     Past Surgical History:  Procedure Laterality Date  . ABDOMINAL HYSTERECTOMY    . BREAST BIOPSY Left 12/2011   positive  . BREAST EXCISIONAL BIOPSY Left 02/2012   positive  . TONSILLECTOMY       reports that she has quit smoking. She has never used smokeless tobacco. She reports that she does not drink alcohol or use drugs.   family history includes  Breast cancer (age of onset: 64) in her sister; Breast cancer (age of onset: 57) in her maternal grandmother.   Outpatient Medications Prior to Visit  Medication Sig Dispense Refill  . amLODipine (NORVASC) 5 MG tablet Take 1 tablet (5 mg total) by mouth daily. 90 tablet 3  . hydrochlorothiazide (HYDRODIURIL) 25 MG tablet Take 1 tablet (25 mg total) by mouth daily. 90 tablet 3  . lisinopril (PRINIVIL,ZESTRIL) 10 MG tablet Take 1 tablet (10 mg total) by mouth daily. 90 tablet 3  . tamoxifen (NOLVADEX) 20 MG tablet Take 1 tablet (20 mg total) by mouth daily. 30 tablet 6   No facility-administered medications prior to visit.      Allergies: Patient has no known allergies.    PHYSICAL EXAM: VS:  BP 110/82 (BP Location: Right Arm, Patient Position: Sitting, Cuff Size: Normal)   Pulse 76   Ht 5' 5.5" (1.664 m)   Wt 179 lb 8 oz (81.4 kg)   BMI 29.42 kg/m  , Body mass index is 29.42 kg/m. Wt Readings from Last 3 Encounters:  04/13/16 179 lb 8 oz (81.4 kg)  02/19/16 175 lb 14.8 oz (79.8 kg)  02/17/16 178 lb 6.4 oz (80.9 kg)    GENERAL:  well developed, well nourished, not in acute distress HEENT: normocephalic, pink conjunctivae, anicteric sclerae, no xanthelasma, normal dentition, oropharynx clear NECK:  no neck vein engorgement, JVP normal, no hepatojugular reflux, carotid upstroke brisk and symmetric,  no bruit, no thyromegaly, no lymphadenopathy LUNGS:  good respiratory effort, clear to auscultation bilaterally CV:  PMI not displaced, no thrills, no lifts, S1 and S2 within normal limits, no palpable S3 or S4, no murmurs, no rubs, no gallops ABD:  Soft, nontender, nondistended, normoactive bowel sounds, no abdominal aortic bruit, no hepatomegaly, no splenomegaly MS: nontender back, no kyphosis, no scoliosis, no joint deformities EXT:  2+ DP/PT pulses, no edema, no varicosities, no cyanosis, no clubbing SKIN: warm, nondiaphoretic, normal turgor, no ulcers NEUROPSYCH: alert, oriented to  person, place, and time, sensory/motor grossly intact, normal mood, appropriate affect  Recent Labs: 02/19/2016: ALT 15; BUN 10; Creatinine, Ser 0.57; Hemoglobin 13.4; Platelets 161; Sodium 137; TSH 2.344 03/11/2016: Potassium 3.9   Lipid Panel No results found for: CHOL, TRIG, HDL, CHOLHDL, VLDL, LDLCALC, LDLDIRECT   Other studies Reviewed:  EKG:  The ekg from 04/13/2016 was personally reviewed by me and it revealed sinus rhythm, 73 BPM  Additional studies/ records that were reviewed personally reviewed by me today include: None available   ASSESSMENT AND PLAN:  Shortness of breath Fatigue Risk factors for CAD include age, hypertension, previous smoking history Recommend stress echocardiogram Also history of chemotherapy with possible cardiotoxic agents and also radiation therapy for breast cancer. Recommend echocardiogram. No evidence of congestive heart failure on exam.  HTN BP is well controlled. Continue monitoring BP. Continue current medical therapy and lifestyle changes.   Current medicines are reviewed at length with the patient today.  The patient does not have concerns regarding medicines.  Labs/ tests ordered today include:  Orders Placed This Encounter  Procedures  . EKG 12-Lead  . ECHOCARDIOGRAM COMPLETE  . ECHOCARDIOGRAM STRESS TEST    I had a lengthy and detailed discussion with the patient regarding diagnoses, prognosis, diagnostic options, treatment options , and side effects of medications.   I counseled the patient on importance of lifestyle modification including heart healthy diet, regular physical activity .   Disposition:   FU with undersigned after tests   Signed, Wende Bushy, MD  04/13/2016 1:04 PM    [place]  This note was generated in part with voice recognition software and I apologize for any typographical errors that were not detected and corrected.

## 2016-04-13 NOTE — Patient Instructions (Addendum)
Testing/Procedures: Your physician has requested that you have an echocardiogram. Echocardiography is a painless test that uses sound waves to create images of your heart. It provides your doctor with information about the size and shape of your heart and how well your heart's chambers and valves are working. This procedure takes approximately one hour. There are no restrictions for this procedure.  Your physician has requested that you have a stress echocardiogram. For further information please visit [email or web address]. Please follow instruction sheet as given.   Do not drink or eat foods with caffeine for 24 hours before the test. (Chocolate, coffee, tea, or energy drinks)  If you use an inhaler, bring it with you to the test.  Do not smoke for 4 hours before the test.  Wear comfortable shoes and clothing.  Follow-Up: Your physician recommends that you schedule a follow-up appointment as needed with Dr. Yvone Neu. We will call you with results and if needed schedule follow up at that time.  It was a pleasure seeing you today here in the office. Please do not hesitate to give [place] a call back if you have any further questions. [place], BSN     Echocardiogram An echocardiogram, or echocardiography, uses sound waves (ultrasound) to produce an image of your heart. The echocardiogram is simple, painless, obtained within a short period of time, and offers valuable information to your health care provider. The images from an echocardiogram can provide information such as:  Evidence of coronary artery disease (CAD).  Heart size.  Heart muscle function.  Heart valve function.  Aneurysm detection.  Evidence of a past heart attack.  Fluid buildup around the heart.  Heart muscle thickening.  Assess heart valve function. Tell a health care provider about:  Any allergies you have.  All medicines you are taking, including vitamins, herbs, eye drops, creams, and  over-the-counter medicines.  Any problems you or family members have had with anesthetic medicines.  Any blood disorders you have.  Any surgeries you have had.  Any medical conditions you have.  Whether you are pregnant or may be pregnant. What happens before the procedure? No special preparation is needed. Eat and drink normally. What happens during the procedure?  In order to produce an image of your heart, gel will be applied to your chest and a wand-like tool (transducer) will be moved over your chest. The gel will help transmit the sound waves from the transducer. The sound waves will harmlessly bounce off your heart to allow the heart images to be captured in real-time motion. These images will then be recorded.  You may need an IV to receive a medicine that improves the quality of the pictures. What happens after the procedure? You may return to your normal schedule including diet, activities, and medicines, unless your health care provider tells you otherwise. This information is not intended to replace advice given to you by your health care provider. Make sure you discuss any questions you have with your health care provider. Document Released: 05/13/2000 Document Revised: 01/02/2016 Document Reviewed: 01/21/2013 Elsevier Interactive Patient Education  2017 [employer].  Exercise Stress Echocardiogram An exercise stress echocardiogram is a test that checks how well your heart is working. For this test, you will walk on a treadmill to make your heart beat faster. This test uses sound waves (ultrasound) and a computer to make pictures (images) of your heart. These pictures will be taken before you exercise and after you exercise. What happens before the procedure?  Follow instructions from your doctor about what you cannot eat or drink before the test.  Do not drink or eat anything that has caffeine in it. Stop having caffeine for 24 hours before the test.  Ask your doctor  about changing or stopping your normal medicines. This is important if you take diabetes medicines or blood thinners. Ask your doctor if you should take your medicines with water before the test.  If you use an inhaler, bring it to the test.  Do not use any products that have nicotine or tobacco in them, such as cigarettes and e-cigarettes. Stop using them for 4 hours before the test. If you need help quitting, ask your doctor.  Wear comfortable shoes and clothing. What happens during the procedure?  You will be hooked up to a TV screen. Your doctor will watch the screen to see how fast your heart beats during the test.  Before you exercise, a computer will make a picture of your heart. To do this:  A gel will be put on your chest.  A wand will be moved over the gel.  Sound waves from the wand will go to the computer to make the picture.  Your will start walking on a treadmill. The treadmill will start at a slow speed. It will get faster a little bit at a time. When you walk faster, your heart will beat faster.  The treadmill will be stopped when your heart is working hard.  You will lie down right away so another picture of your heart can be taken.  The test will take 30-60 minutes. What happens after the procedure?  Your heart rate and blood pressure will be watched after the test.  If your doctor says that you can, you may:  Eat what you usually eat.  Do your normal activities.  Take medicines like normal. Summary  An exercise stress echocardiogram is a test that checks how well your heart is working.  Follow instructions about what you cannot eat or drink before the test. Ask your doctor if you should take your normal medicines before the test.  Stop having caffeine for 24 hours before the test. Do not use anything with nicotine or tobacco in it for 4 hours before the test.  A computer will take a picture of your heart before you walk on a treadmill. It will take  another picture when you are done walking.  Your heart rate and blood pressure will be watched after the test. This information is not intended to replace advice given to you by your health care provider. Make sure you discuss any questions you have with your health care provider. Document Released: 03/13/2009 Document Revised: 02/07/2016 Document Reviewed: 02/07/2016 Elsevier Interactive Patient Education  2017 [employer].

## 2016-04-29 ENCOUNTER — Ambulatory Visit (INDEPENDENT_AMBULATORY_CARE_PROVIDER_SITE_OTHER): Payer: Medicare Other

## 2016-04-29 VITALS — BP 122/82 | HR 74 | Temp 98.0°F | Resp 14 | Ht 65.0 in | Wt 180.4 lb

## 2016-04-29 DIAGNOSIS — Z0000 Encounter for general adult medical examination without abnormal findings: Secondary | ICD-10-CM

## 2016-04-29 NOTE — Progress Notes (Signed)
Subjective:   Amanda Liu is a 60 y.o. female who presents for an Initial Medicare Annual Wellness Visit.  Review of Systems    No ROS.  Medicare Wellness Visit.  Cardiac Risk Factors include: hypertension;advanced age (>39men, >17 women)     Objective:    Today's Vitals   04/29/16 0919  BP: 122/82  Pulse: 74  Resp: 14  Temp: 98 F (36.7 C)  TempSrc: Oral  Weight: 180 lb 6.4 oz (81.8 kg)  Height: 5\' 5"  (1.651 m)   Body mass index is 30.02 kg/m.   Current Medications (verified) Outpatient Encounter Prescriptions as of 04/29/2016  Medication Sig  . amLODipine (NORVASC) 5 MG tablet Take 1 tablet (5 mg total) by mouth daily.  . hydrochlorothiazide (HYDRODIURIL) 25 MG tablet Take 1 tablet (25 mg total) by mouth daily.  Marland Kitchen lisinopril (PRINIVIL,ZESTRIL) 10 MG tablet Take 1 tablet (10 mg total) by mouth daily.  . tamoxifen (NOLVADEX) 20 MG tablet Take 1 tablet (20 mg total) by mouth daily.   No facility-administered encounter medications on file as of 04/29/2016.     Allergies (verified) Patient has no known allergies.   History: Past Medical History:  Diagnosis Date  . Allergic rhinitis   . Breast cancer ([place]) 02/2012   left breast lumpectomy, radiation and chemotherapy  . Chickenpox   . Elevated blood pressure   . Fatigue due to depression 06/18/2015  . UTI (lower urinary tract infection)    Past Surgical History:  Procedure Laterality Date  . ABDOMINAL HYSTERECTOMY    . BREAST BIOPSY Left 12/2011   positive  . BREAST EXCISIONAL BIOPSY Left 02/2012   positive  . TONSILLECTOMY     Family History  Problem Relation Age of Onset  . Breast cancer Sister 18  . Breast cancer Maternal Grandmother 80  . Alcoholism      Parent   Social History   Occupational History  . Not on file.   Social History Main Topics  . Smoking status: Former Research scientist (life sciences)  . Smokeless tobacco: Never Used  . Alcohol use No  . Drug use: No  . Sexual activity: Yes    Tobacco  Counseling Counseling given: Not Answered   Activities of Daily Living In your present state of health, do you have any difficulty performing the following activities: 04/29/2016  Hearing? N  Vision? N  Difficulty concentrating or making decisions? Y  Walking or climbing stairs? Y  Dressing or bathing? N  Doing errands, shopping? N  Preparing Food and eating ? N  Using the Toilet? N  In the past six months, have you accidently leaked urine? N  Do you have problems with loss of bowel control? N  Managing your Medications? N  Managing your Finances? N  Housekeeping or managing your Housekeeping? Y  Some recent data might be hidden    Immunizations and Health Maintenance Immunization History  Administered Date(s) Administered  . Influenza,inj,Quad PF,36+ Mos 02/25/2015, 03/18/2016   Health Maintenance Due  Topic Date Due  . Hepatitis C Screening  November 25, 1955  . HIV Screening  07/19/1970  . TETANUS/TDAP  07/19/1974  . COLONOSCOPY  07/19/2005  . ZOSTAVAX  07/20/2015    Patient Care Team: Leone Haven, MD as PCP - General (Family Medicine)  Indicate any recent Medical Services you may have received from other than [facility] providers in the past year (date may be approximate).     Assessment:   This is a routine wellness examination for Amanda Liu. The  goal of the wellness visit is to assist the patient how to close the gaps in care and create a preventative care plan for the patient.   Osteoporosis risk reviewed.  Medications reviewed; taking without issues or barriers.  Safety issues reviewed; lives with husband.  Smoke and carbon monoxide detectors in the home. No firearms in the home. Wears seatbelts when driving or riding with others. No violence in the home.  No identified risk were noted; The patient was oriented x 3; appropriate in dress and manner and no objective failures at ADL's or IADL's.   BMI; discussed the importance of a healthy diet, water intake and  exercise. Educational material provided.  Patient Concerns: None at this time. Follow up with PCP as needed.  Hearing/Vision screen Hearing Screening Comments: Passes the hearing test  Vision Screening Comments: Followed by [facility] Wears glasses Vision screening deferred per patient request   Dietary issues and exercise activities discussed: Current Exercise Habits: Home exercise routine, Type of exercise: walking  Goals    . Increase physical activity          [employer] water aerobics program     . Increase water intake          Stay hydrated and drink plenty of fluids      Depression Screen PHQ 2/9 Scores 04/29/2016  PHQ - 2 Score 0    Fall Risk Fall Risk  04/29/2016  Falls in the past year? No    Cognitive Function:     6CIT Screen 04/29/2016  What Year? 0 points  What month? 0 points  What time? 0 points  Count back from 20 0 points  Months in reverse 0 points  Repeat phrase 0 points  Total Score 0    Screening Tests Health Maintenance  Topic Date Due  . Hepatitis C Screening  [birth year]  . HIV Screening  07/19/1970  . TETANUS/TDAP  07/19/1974  . COLONOSCOPY  07/19/2005  . ZOSTAVAX  07/20/2015  . PAP SMEAR  04/30/2019 (Originally 07/19/1976)  . MAMMOGRAM  10/28/2017  . INFLUENZA VACCINE  Completed      Plan:    End of life planning; Advance aging; Advanced directives discussed. No HCPOA/Living Will.  Additional information declined at this time.  Follow up with PCP.  Medicare Attestation I have personally reviewed: The patient's medical and social history Their use of alcohol, tobacco or illicit drugs Their current medications and supplements The patient's functional ability including ADLs,fall risks, home safety risks, cognitive, and hearing and visual impairment Diet and physical activities Evidence for depression   The patient's weight, height, BMI, and visual acuity have been recorded in the chart.  I have made  referrals and provided education to the patient based on review of the above and I have provided the patient with a written personalized care plan for preventive services.    During the course of the visit, Amanda Liu was educated and counseled about the following appropriate screening and preventive services:   Vaccines to include Pneumoccal, Influenza, Hepatitis B, Td, Zostavax, HCV  Electrocardiogram  Cardiovascular disease screening  Colorectal cancer screening  Bone density screening  Diabetes screening  Glaucoma screening  Mammography/PAP  Nutrition counseling  Smoking cessation counseling  Patient Instructions (the written plan) were given to the patient.    Varney Biles, LPN   [id number]

## 2016-04-29 NOTE — Progress Notes (Signed)
Care was provided under my supervision. I agree with the management as indicated in the note.  Arhum Peeples DO  

## 2016-04-29 NOTE — Patient Instructions (Addendum)
Ms. Amanda Liu , Thank you for taking time to come for your Medicare Wellness Visit. I appreciate your ongoing commitment to your health goals. Please review the following plan we discussed and let me know if I can assist you in the future.   FOLLOW UP WITH DR. SONNENBERG AS DIRECTED.  COLONOSCOPY as directed.  These are the goals we discussed: Goals    . Increase physical activity          [employer] water aerobics program     . Increase water intake          Stay hydrated and drink plenty of fluids       This is a list of the screening recommended for you and due dates:  Health Maintenance  Topic Date Due  .  Hepatitis C: One time screening is recommended by Center for Disease Control  (CDC) for  adults born from [birth year] through 1965.   [birth year]  . HIV Screening  07/19/1970  . Tetanus Vaccine  07/19/1974  . Colon Cancer Screening  07/19/2005  . Shingles Vaccine  07/20/2015  . Pap Smear  04/30/2019*  . Mammogram  10/28/2017  . Flu Shot  Completed  *Topic was postponed. The date shown is not the original due date.    Fall Prevention in the Home Introduction Falls can cause injuries. They can happen to people of all ages. There are many things you can do to make your home safe and to help prevent falls. What can I do on the outside of my home?  Regularly fix the edges of walkways and driveways and fix any cracks.  Remove anything that might make you trip as you walk through a door, such as a raised step or threshold.  Trim any bushes or trees on the path to your home.  Use bright outdoor lighting.  Clear any walking paths of anything that might make someone trip, such as rocks or tools.  Regularly check to see if handrails are loose or broken. Make sure that both sides of any steps have handrails.  Any raised decks and porches should have guardrails on the edges.  Have any leaves, snow, or ice cleared regularly.  Use sand or salt on walking paths during  winter.  Clean up any spills in your garage right away. This includes oil or grease spills. What can I do in the bathroom?  Use night lights.  Install grab bars by the toilet and in the tub and shower. Do not use towel bars as grab bars.  Use non-skid mats or decals in the tub or shower.  If you need to sit down in the shower, use a plastic, non-slip stool.  Keep the floor dry. Clean up any water that spills on the floor as soon as it happens.  Remove soap buildup in the tub or shower regularly.  Attach bath mats securely with double-sided non-slip rug tape.  Do not have throw rugs and other things on the floor that can make you trip. What can I do in the bedroom?  Use night lights.  Make sure that you have a light by your bed that is easy to reach.  Do not use any sheets or blankets that are too big for your bed. They should not hang down onto the floor.  Have a firm chair that has side arms. You can use this for support while you get dressed.  Do not have throw rugs and other things on the floor that  can make you trip. What can I do in the kitchen?  Clean up any spills right away.  Avoid walking on wet floors.  Keep items that you use a lot in easy-to-reach places.  If you need to reach something above you, use a strong step stool that has a grab bar.  Keep electrical cords out of the way.  Do not use floor polish or wax that makes floors slippery. If you must use wax, use non-skid floor wax.  Do not have throw rugs and other things on the floor that can make you trip. What can I do with my stairs?  Do not leave any items on the stairs.  Make sure that there are handrails on both sides of the stairs and use them. Fix handrails that are broken or loose. Make sure that handrails are as long as the stairways.  Check any carpeting to make sure that it is firmly attached to the stairs. Fix any carpet that is loose or worn.  Avoid having throw rugs at the top or  bottom of the stairs. If you do have throw rugs, attach them to the floor with carpet tape.  Make sure that you have a light switch at the top of the stairs and the bottom of the stairs. If you do not have them, ask someone to add them for you. What else can I do to help prevent falls?  Wear shoes that:  Do not have high heels.  Have rubber bottoms.  Are comfortable and fit you well.  Are closed at the toe. Do not wear sandals.  If you use a stepladder:  Make sure that it is fully opened. Do not climb a closed stepladder.  Make sure that both sides of the stepladder are locked into place.  Ask someone to hold it for you, if possible.  Clearly mark and make sure that you can see:  Any grab bars or handrails.  First and last steps.  Where the edge of each step is.  Use tools that help you move around (mobility aids) if they are needed. These include:  Canes.  Walkers.  Scooters.  Crutches.  Turn on the lights when you go into a dark area. Replace any light bulbs as soon as they burn out.  Set up your furniture so you have a clear path. Avoid moving your furniture around.  If any of your floors are uneven, fix them.  If there are any pets around you, be aware of where they are.  Review your medicines with your doctor. Some medicines can make you feel dizzy. This can increase your chance of falling. Ask your doctor what other things that you can do to help prevent falls. This information is not intended to replace advice given to you by your health care provider. Make sure you discuss any questions you have with your health care provider. Document Released: 03/12/2009 Document Revised: 10/22/2015 Document Reviewed: 06/20/2014  2017 Elsevier

## 2016-05-12 ENCOUNTER — Other Ambulatory Visit: Payer: Medicare Other

## 2016-05-13 ENCOUNTER — Inpatient Hospital Stay: Payer: Medicare Other | Attending: Hematology and Oncology

## 2016-05-17 ENCOUNTER — Encounter: Payer: Self-pay | Admitting: Cardiology

## 2016-05-18 ENCOUNTER — Ambulatory Visit: Payer: Medicare Other | Admitting: Family Medicine

## 2016-06-16 ENCOUNTER — Inpatient Hospital Stay: Payer: Medicare Other

## 2016-06-16 ENCOUNTER — Ambulatory Visit: Payer: Medicare Other | Admitting: Radiation Oncology

## 2016-06-16 ENCOUNTER — Inpatient Hospital Stay: Payer: Medicare Other | Admitting: Hematology and Oncology

## 2016-06-27 NOTE — Progress Notes (Signed)
Hematology/Oncology Consult note [facility]  [phone] [phone]  Patient Care Team: Leone Haven, MD as PCP - General (Family Medicine)   Name of the patient: Amanda Liu  [id number]  04-11-1956   Date of visit: 06/27/16  Diagnosis- Left  breast cancer Stage IIIB ypT2N2a  Chief complaint/ Reason for visit- routine f/u of breast cancer Stage IIIB ypT2N2a  Heme/Onc history: The patient presented in 2013 with a palpable/visible mass.  Mammogram on 07/19/2011 revealed a 3 x 2.6 cm irregular mass in the superior left breast at the 12:00 position. There were microcalcifications associated with the mass.   PET scan on 08/09/2011 revealed a hypermetabolic mass in the left breast consistent with patient's known malignancy. There was mild hypermetabolic activity in the left axillary lymph nodes. There was no evidence of metastatic disease. There were indeterminate pulmonary nodules 5 mm or less below the resolution of PET.  Left breast biopsy on 08/16/2011 revealed invasive mammary carcinoma.  Estrogen receptor was positive (> 90%) progesterone receptor positive (> 90%) and HER-2/neu negative.  She received neoadjuvant chemotherapy consisting of AC x 4 every 3 weeks (08/31/2011 - 11/02/2011) and weekly Taxol x 12 (11/23/2011 - 02/08/2012).  She tolerated her chemotherapy well.  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  She underwent left partial mastectomy and sentinel lymph node biopsy by Dr. Rochel Brome on 03/16/2012.  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  Tumor invaded the dermis without skin ulceration. 5 of 11 lymph nodes were positive for macro metastasis.  Pathologic stage was ypT2N2a (stage IIIB).  She received radiation from 03/2012 - 05/2012.    She was on Armidex for 2 1/2 years beginning 05/2012.  She developed bone pain and fatigue.  She states that she was  taken off Arimidex for 30 days beginning 12/08/2014.  She was prescribed Aromasin, but did not take as it cost too much.  Tamoxifen was started in 12/2014.    Interval history- Patient reports fatigue, joint pain while taking tamoxifen and stopped taking it about 6 weeks ago. She does feel somewhat better but some symptoms persist. 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  HENT: Negative for congestion, ear discharge and nosebleeds.   Eyes: Negative for blurred vision.  Respiratory: Negative for cough, hemoptysis, sputum production, shortness of breath and wheezing.   Cardiovascular: Negative for chest pain, palpitations, orthopnea and claudication.  Gastrointestinal: Negative for abdominal pain, blood in stool, constipation, diarrhea, heartburn, melena, nausea and vomiting.  Genitourinary: Negative for dysuria, flank pain, frequency, hematuria and urgency.  Musculoskeletal: Negative for back pain, joint pain and myalgias.  Skin: Negative for rash.  Neurological: Negative for dizziness, tingling, focal weakness, seizures, weakness and headaches.  Endo/Heme/Allergies: Does not bruise/bleed easily.  Psychiatric/Behavioral: Negative for depression and suicidal ideas. The patient does not have insomnia.      Current treatment- tamoxifen  No Known Allergies   Past Medical History:  Diagnosis Date  . Allergic rhinitis   . Breast cancer ([place]) 02/2012   left breast lumpectomy, radiation and chemotherapy  . Chickenpox   . Elevated blood pressure   . Fatigue due to depression 06/18/2015  . UTI (lower urinary tract infection)      Past Surgical History:  Procedure Laterality  Date  . ABDOMINAL HYSTERECTOMY    . BREAST BIOPSY Left 12/2011   positive  . BREAST EXCISIONAL BIOPSY Left 02/2012   positive  .  TONSILLECTOMY      Social History   Social History  . Marital status: Married    Spouse name: N/A  . Number of children: N/A  . Years of education: N/A   Occupational History  . Not on file.   Social History Main Topics  . Smoking status: Former Research scientist (life sciences)  . Smokeless tobacco: Never Used  . Alcohol use No  . Drug use: No  . Sexual activity: Yes   Other Topics Concern  . Not on file   Social History Narrative  . No narrative on file    Family History  Problem Relation Age of Onset  . Breast cancer Sister 70  . Breast cancer Maternal Grandmother 80  . Alcoholism      Parent     Current Outpatient Prescriptions:  .  amLODipine (NORVASC) 5 MG tablet, Take 1 tablet (5 mg total) by mouth daily., Disp: 90 tablet, Rfl: 3 .  hydrochlorothiazide (HYDRODIURIL) 25 MG tablet, Take 1 tablet (25 mg total) by mouth daily., Disp: 90 tablet, Rfl: 3 .  lisinopril (PRINIVIL,ZESTRIL) 10 MG tablet, Take 1 tablet (10 mg total) by mouth daily., Disp: 90 tablet, Rfl: 3 .  tamoxifen (NOLVADEX) 20 MG tablet, Take 1 tablet (20 mg total) by mouth daily., Disp: 30 tablet, Rfl: 6  Physical exam:  Vitals:   06/28/16 0905  BP: 121/86  Pulse: 69  Resp: 18  Temp: 97.8 F (36.6 C)  TempSrc: Tympanic  Weight: 177 lb 0.5 oz (80.3 kg)   Physical Exam  Constitutional: She is oriented to person, place, and time and well-developed, well-nourished, and in no distress.  HENT:  Head: Normocephalic and atraumatic.  Eyes: EOM are normal. Pupils are equal, round, and reactive to light.  Neck: Normal range of motion.  Cardiovascular: Normal rate, regular rhythm and normal heart sounds.   Pulmonary/Chest: Effort normal and breath sounds normal.  Abdominal: Soft. Bowel sounds are normal.  Neurological: She is alert and oriented to person, place, and time.  Skin: Skin is warm and dry.   .Breast exam was performed in seated and lying down position. Patient is status post left lumpectomy with a well-healed  surgical scar. No evidence of any palpable masses. No evidence of axillary adenopathy. No evidence of any palpable masses or lumps in the right breast. No evidence of right axillary adenopathy  CMP Latest Ref Rng & Units 03/11/2016  Glucose 65 - 99 mg/dL -  BUN 6 - 20 mg/dL -  Creatinine 0.44 - 1.00 mg/dL -  Sodium 135 - 145 mmol/L -  Potassium 3.5 - 5.1 mEq/L 3.9  Chloride 101 - 111 mmol/L -  CO2 22 - 32 mmol/L -  Calcium 8.9 - 10.3 mg/dL -  Total Protein 6.5 - 8.1 g/dL -  Total Bilirubin 0.3 - 1.2 mg/dL -  Alkaline Phos 38 - 126 U/L -  AST 15 - 41 U/L -  ALT 14 - 54 U/L -   CBC Latest Ref Rng & Units 02/19/2016  WBC 3.6 - 11.0 K/uL 5.9  Hemoglobin 12.0 - 16.0 g/dL 13.4  Hematocrit 35.0 - 47.0 % 38.9  Platelets 150 - 440 K/uL 161    Last bone density scan was from May 2016 which revealed osteopenia anemia with a T score of -1.4 at the left femur neck. 10 year probability  of a major osteoporotic fracture was 7.5% and Hip fracture was 0.6%  Assessment and plan- Patient is a 61 y.o. female with a history of left breast cancer stage IIIB status post neoadjuvant chemotherapy followed by lumpectomy and adjuvant radiation  1. I again encouraged the patient to restart her tamoxifen given that she had high risk strongly hormone positive breast cancer. She is willing to restart it. We will call her in about 3 weeks time to see how she is doing.  Continue tamoxifen and total duration of adjuvant endocrine therapy would be 10 years given that she has stage IIIB breast cancer ending in 2024 2.  Labs today:  CBC with diff, CMP, CA27.29, CA15-3. 3. Patient will be due for a mammogram in June 2018 and a bone density scan at the same time    4. Port flush every 6 weeks. 5.  RTC in 4 month for MD assess and labs (CBC with diff, CMP, Ca27.29).   Total face to face encounter time for this patient visit was 30 min. >50% of the time was  spent in counseling and coordination of care. I discussed the risks  versus benefits of taking tamoxifen.     Visit Diagnosis 1. Malignant neoplasm of left breast in female, estrogen receptor positive, unspecified site of breast ([place])   2. Osteopenia, unspecified location   3. Post-menopausal      Dr. Randa Evens, MD, MPH [place] [facility] Pager- [phone] 06/27/2016 12:12 PM

## 2016-06-28 ENCOUNTER — Inpatient Hospital Stay: Payer: Medicare Other | Attending: Hematology and Oncology | Admitting: Oncology

## 2016-06-28 ENCOUNTER — Ambulatory Visit
Admission: RE | Admit: 2016-06-28 | Discharge: 2016-06-28 | Disposition: A | Discharge status: Home / Self Care | Payer: Medicare Other | Source: Ambulatory Visit | Attending: Radiation Oncology | Admitting: Radiation Oncology

## 2016-06-28 ENCOUNTER — Inpatient Hospital Stay: Payer: Medicare Other

## 2016-06-28 VITALS — BP 121/86 | HR 69 | Temp 97.8°F | Resp 18 | Wt 177.0 lb

## 2016-06-28 DIAGNOSIS — C50912 Malignant neoplasm of unspecified site of left female breast: Secondary | ICD-10-CM

## 2016-06-28 DIAGNOSIS — Z170 Estrogen receptor positive status [ER+]: Secondary | ICD-10-CM | POA: Insufficient documentation

## 2016-06-28 DIAGNOSIS — Z79899 Other long term (current) drug therapy: Secondary | ICD-10-CM | POA: Diagnosis not present

## 2016-06-28 DIAGNOSIS — M8580 Other specified disorders of bone density and structure, unspecified site: Secondary | ICD-10-CM | POA: Diagnosis not present

## 2016-06-28 DIAGNOSIS — Z9221 Personal history of antineoplastic chemotherapy: Secondary | ICD-10-CM | POA: Diagnosis not present

## 2016-06-28 DIAGNOSIS — Z79810 Long term (current) use of selective estrogen receptor modulators (SERMs): Secondary | ICD-10-CM | POA: Insufficient documentation

## 2016-06-28 DIAGNOSIS — Z803 Family history of malignant neoplasm of breast: Secondary | ICD-10-CM | POA: Insufficient documentation

## 2016-06-28 DIAGNOSIS — Z780 Asymptomatic menopausal state: Secondary | ICD-10-CM

## 2016-06-28 DIAGNOSIS — Z95828 Presence of other vascular implants and grafts: Secondary | ICD-10-CM

## 2016-06-28 DIAGNOSIS — Z923 Personal history of irradiation: Secondary | ICD-10-CM | POA: Insufficient documentation

## 2016-06-28 DIAGNOSIS — R5383 Other fatigue: Secondary | ICD-10-CM | POA: Insufficient documentation

## 2016-06-28 DIAGNOSIS — Z87891 Personal history of nicotine dependence: Secondary | ICD-10-CM

## 2016-06-28 DIAGNOSIS — Z9012 Acquired absence of left breast and nipple: Secondary | ICD-10-CM | POA: Insufficient documentation

## 2016-06-28 DIAGNOSIS — Z87440 Personal history of urinary (tract) infections: Secondary | ICD-10-CM | POA: Insufficient documentation

## 2016-06-28 DIAGNOSIS — F329 Major depressive disorder, single episode, unspecified: Secondary | ICD-10-CM | POA: Diagnosis not present

## 2016-06-28 DIAGNOSIS — I10 Essential (primary) hypertension: Secondary | ICD-10-CM | POA: Insufficient documentation

## 2016-06-28 DIAGNOSIS — G629 Polyneuropathy, unspecified: Secondary | ICD-10-CM | POA: Diagnosis not present

## 2016-06-28 DIAGNOSIS — C779 Secondary and unspecified malignant neoplasm of lymph node, unspecified: Secondary | ICD-10-CM | POA: Diagnosis not present

## 2016-06-28 DIAGNOSIS — M2550 Pain in unspecified joint: Secondary | ICD-10-CM | POA: Insufficient documentation

## 2016-06-28 DIAGNOSIS — R918 Other nonspecific abnormal finding of lung field: Secondary | ICD-10-CM

## 2016-06-28 DIAGNOSIS — C50812 Malignant neoplasm of overlapping sites of left female breast: Secondary | ICD-10-CM | POA: Diagnosis not present

## 2016-06-28 DIAGNOSIS — C50012 Malignant neoplasm of nipple and areola, left female breast: Secondary | ICD-10-CM

## 2016-06-28 DIAGNOSIS — Z8719 Personal history of other diseases of the digestive system: Secondary | ICD-10-CM | POA: Diagnosis not present

## 2016-06-28 LAB — CBC WITH DIFFERENTIAL/PLATELET
Basophils Absolute: 0 10*3/uL (ref 0–0.1)
Basophils Relative: 1 %
Eosinophils Absolute: 0.2 10*3/uL (ref 0–0.7)
Eosinophils Relative: 4 %
HCT: 39.7 % (ref 35.0–47.0)
Hemoglobin: 13.6 g/dL (ref 12.0–16.0)
Lymphocytes Relative: 27 %
Lymphs Abs: 1.5 10*3/uL (ref 1.0–3.6)
MCH: 32.4 pg (ref 26.0–34.0)
MCHC: 34.1 g/dL (ref 32.0–36.0)
MCV: 95 fL (ref 80.0–100.0)
Monocytes Absolute: 0.3 10*3/uL (ref 0.2–0.9)
Monocytes Relative: 6 %
Neutro Abs: 3.5 10*3/uL (ref 1.4–6.5)
Neutrophils Relative %: 62 %
Platelets: 175 10*3/uL (ref 150–440)
RBC: 4.18 MIL/uL (ref 3.80–5.20)
RDW: 12.9 % (ref 11.5–14.5)
WBC: 5.6 10*3/uL (ref 3.6–11.0)

## 2016-06-28 LAB — COMPREHENSIVE METABOLIC PANEL
ALT: 18 U/L (ref 14–54)
AST: 26 U/L (ref 15–41)
Albumin: 4 g/dL (ref 3.5–5.0)
Alkaline Phosphatase: 38 U/L (ref 38–126)
Anion gap: 8 (ref 5–15)
BUN: 14 mg/dL (ref 6–20)
CO2: 25 mmol/L (ref 22–32)
Calcium: 9.1 mg/dL (ref 8.9–10.3)
Chloride: 103 mmol/L (ref 101–111)
Creatinine, Ser: 0.55 mg/dL (ref 0.44–1.00)
GFR calc Af Amer: >60 mL/min (ref >60)
GFR calc non Af Amer: >60 mL/min (ref >60)
Glucose, Bld: 113 mg/dL — ABNORMAL HIGH (ref 65–99)
Potassium: 3.6 mmol/L (ref 3.5–5.1)
Sodium: 136 mmol/L (ref 135–145)
Total Bilirubin: 0.4 mg/dL (ref 0.3–1.2)
Total Protein: 6.8 g/dL (ref 6.5–8.1)

## 2016-06-28 MED ORDER — SODIUM CHLORIDE 0.9% FLUSH
10.0000 mL | INTRAVENOUS | Status: DC | PRN
  Administered 2016-06-28: 10 mL via INTRAVENOUS
  Filled 2016-06-28: qty 10

## 2016-06-28 MED ORDER — HEPARIN SOD (PORK) LOCK FLUSH 100 UNIT/ML IV SOLN
500.0000 [IU] | Freq: Once | INTRAVENOUS | Status: AC
  Administered 2016-06-28: 500 [IU] via INTRAVENOUS

## 2016-06-28 NOTE — Progress Notes (Signed)
Pt states she is doing well overall. Stated she ran out of Temoxifan and sis not renew-over one month.c/o flu like sx when on this med. Hesitant to always feel this way. C/o neuropathy feeling per pt in feet. Stated she is scheduled to go for a stress test in near furture.

## 2016-06-28 NOTE — Progress Notes (Signed)
Radiation Oncology Follow up Note  Name: Amanda Liu   Date:   06/28/2016 MRN:  [id number] DOB: [birth year]    This 61 y.o. female presents to the clinic today for for your follow-up status post whole breast and peripheral lymphatic radiation for stage IIIB ER/PR positive invasive mammary carcinoma of her left breast.  REFERRING PROVIDER: Lorelee Market, MD  HPI: Patient is a 61 year old female now out over 4 years having completed chemotherapy as well as adjuvant whole breast and peripheral lymphatic radiation for stage IIIB (T4 N2 a M0) ER/PR positive HER-2/neu negative invasive mammary carcinoma. Seen today in routine follow-up she is doing well. She specifically denies breast tenderness cough or bone pain.Marland Kitchen Her last mammograms were in June and were BI-RADS 2 benign. She's currently on tamoxifen tolerating that well without side effect.  COMPLICATIONS OF TREATMENT: none  FOLLOW UP COMPLIANCE: keeps appointments   PHYSICAL EXAM:  There were no vitals taken for this visit. Lungs are clear to A&P cardiac examination essentially unremarkable with regular rate and rhythm. No dominant mass or nodularity is noted in either breast in 2 positions examined. Incision is well-healed. No axillary or supraclavicular adenopathy is appreciated. Cosmetic result is excellent. No evidence of lymphedema of her left upper extremity is noted. Well-developed well-nourished patient in NAD. HEENT reveals PERLA, EOMI, discs not visualized.  Oral cavity is clear. No oral mucosal lesions are identified. Neck is clear without evidence of cervical or supraclavicular adenopathy. Lungs are clear to A&P. Cardiac examination is essentially unremarkable with regular rate and rhythm without murmur rub or thrill. Abdomen is benign with no organomegaly or masses noted. Motor sensory and DTR levels are equal and symmetric in the upper and lower extremities. Cranial nerves II through XII are grossly intact. Proprioception is  intact. No peripheral adenopathy or edema is identified. No motor or sensory levels are noted. Crude visual fields are within normal range.  RADIOLOGY RESULTS: Mammograms are reviewed and compatible with the above-stated findings  PLAN: Present time patient is doing well with no evidence of disease. She continues close follow-up care with for month visits to medical oncology. I'm going to discontinue her follow-up care. Patient is to call sooner with any concerns.  I would like to take this opportunity to thank you for allowing me to participate in the care of your patient.Armstead Peaks., MD

## 2016-06-29 LAB — CANCER ANTIGEN 27.29: CA 27.29: 13.4 U/mL (ref 0.0–38.6)

## 2016-07-22 ENCOUNTER — Ambulatory Visit: Payer: Medicare Other | Admitting: Family Medicine

## 2016-07-25 ENCOUNTER — Telehealth: Payer: Self-pay | Admitting: *Deleted

## 2016-07-25 NOTE — Telephone Encounter (Signed)
Called the patient to check and see if she went back on tamoxifen after her being in clinic.  She did say that she started back on the drug for her breast cancer and it did not take very long before she starting having the pain back.  She hurts her hips mostly, her knees hurt after that and she still hurts in her feet because of neuropathy.  She gets fatigued by early afternoon  When on the med.  She uses heating pad for knees and feet and it helps some on the worst of days she take naproxen but tries to go about her days at a lower speed and can't do all the things she wants to do because or the pain. She is not depressed and she does not experience wt gain with the med.  She will call back if she needs a rx for pain in the future or if the pain is no longer tolerable for her.  I did rec: that some ladies take the pill at night to see if that makes it better. She will try.  She feels that quality of life is not as good since the she has this pain every day.

## 2016-08-12 ENCOUNTER — Other Ambulatory Visit: Payer: Self-pay | Admitting: Oncology

## 2016-08-12 DIAGNOSIS — C50912 Malignant neoplasm of unspecified site of left female breast: Secondary | ICD-10-CM

## 2016-08-23 ENCOUNTER — Inpatient Hospital Stay: Payer: Medicare Other | Attending: Oncology

## 2016-08-24 ENCOUNTER — Encounter: Payer: Self-pay | Admitting: Family Medicine

## 2016-08-24 ENCOUNTER — Ambulatory Visit (INDEPENDENT_AMBULATORY_CARE_PROVIDER_SITE_OTHER): Payer: Medicare Other | Admitting: Family Medicine

## 2016-08-24 ENCOUNTER — Telehealth: Payer: Self-pay | Admitting: Family Medicine

## 2016-08-24 ENCOUNTER — Ambulatory Visit (INDEPENDENT_AMBULATORY_CARE_PROVIDER_SITE_OTHER): Payer: Medicare Other

## 2016-08-24 VITALS — BP 128/82 | HR 65 | Temp 98.2°F | Wt 179.2 lb

## 2016-08-24 DIAGNOSIS — R0781 Pleurodynia: Secondary | ICD-10-CM

## 2016-08-24 DIAGNOSIS — Z853 Personal history of malignant neoplasm of breast: Secondary | ICD-10-CM | POA: Diagnosis not present

## 2016-08-24 DIAGNOSIS — T451X5A Adverse effect of antineoplastic and immunosuppressive drugs, initial encounter: Secondary | ICD-10-CM

## 2016-08-24 DIAGNOSIS — G620 Drug-induced polyneuropathy: Secondary | ICD-10-CM | POA: Diagnosis not present

## 2016-08-24 DIAGNOSIS — S2239XG Fracture of one rib, unspecified side, subsequent encounter for fracture with delayed healing: Secondary | ICD-10-CM

## 2016-08-24 DIAGNOSIS — S299XXA Unspecified injury of thorax, initial encounter: Secondary | ICD-10-CM | POA: Diagnosis not present

## 2016-08-24 DIAGNOSIS — I10 Essential (primary) hypertension: Secondary | ICD-10-CM

## 2016-08-24 DIAGNOSIS — R0789 Other chest pain: Secondary | ICD-10-CM

## 2016-08-24 NOTE — Patient Instructions (Signed)
Nice to see you. Please continue your blood pressure medication. Please contact the cardiologist to get the echo set up again. We will get x-rays of your left ribs and contact you with the results.

## 2016-08-24 NOTE — Assessment & Plan Note (Signed)
Patient with injury several months ago and had pain in this area that resolved. Has had recurrence of this discomfort. Could be related to prior injury though given her history of breast cancer warrants x-ray to evaluate for other bony lesions. We will contact her with the x-ray results.

## 2016-08-24 NOTE — Assessment & Plan Note (Signed)
At goal. Continue current medications. 

## 2016-08-24 NOTE — Assessment & Plan Note (Signed)
Continues to have issues with this. She does not want start on any oral medications. Discussed that she could continue the capsaicin lotion. She'll continue to monitor.

## 2016-08-24 NOTE — Telephone Encounter (Signed)
Spoke with patient regarding x-ray results. Advised of rib fractures. Discussed that given that it appears that they have not healed over the last 4 months she will need to see somebody to consider intervention for this. She has had no injury since November. Advised if she has any breathing issues or fever or worsening pain she should be evaluated. We will check on the best person to refer her to in this area for this issue is tomorrow.

## 2016-08-24 NOTE — Progress Notes (Signed)
Pre visit review using our clinic review tool, if applicable. No additional management support is needed unless otherwise documented below in the visit note. 

## 2016-08-24 NOTE — Assessment & Plan Note (Signed)
Overall doing well. She is back on tamoxifen which has caused some body aches. She is following with oncology. She'll proceed with mammogram in June.

## 2016-08-24 NOTE — Progress Notes (Signed)
Tommi Rumps, MD Phone: [phone]  Amanda Liu is a 61 y.o. female who presents today for f/u.  HYPERTENSION  Disease Monitoring  Home BP Monitoring similar to today Chest pain- no    Dyspnea- no Medications  Compliance-  Taking lisinopril, HCTZ. amlodipine.  Edema- no Saw cardiology though had to reschedule and never went back for the echo. She's planning on calling him to get this set up.  History of breast cancer: She notes no breast changes. She has seen oncology and radiation oncology in the last several months. They got her back on tamoxifen. Notes this causes aches and pains throughout her body at times. She due for mammogram in June.  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 Hands usually bother her in the morning. She found a capsaicin lotion that helped significantly. She does not want to take oral medications. Notes occasionally numbness and tingling in her feet as well.  She reports she fell last fall when her dog tripped or. She landed on her left lateral posterior ribs on a bird bath. Also hit the back of her head. She noted no loss of consciousness. She's not had any numbness or weakness or vision changes or headaches. She notes her ribs hurt for a period of time and then resolved though over the last 2 weeks have recurred. She notes no specific injury recently. Sleeping does make her sore but no injury.   PMH: former smoker   ROS see history of present illness  Objective  Physical Exam Vitals:   08/24/16 0813  BP: 128/82  Pulse: 65  Temp: 98.2 F (36.8 C)    BP Readings from Last 3 Encounters:  08/24/16 128/82  06/28/16 121/86  04/29/16 122/82   Wt Readings from Last 3 Encounters:  08/24/16 179 lb 3.2 oz (81.3 kg)  06/28/16 177 lb 0.5 oz (80.3 kg)  04/29/16 180 lb 6.4 oz (81.8 kg)    Physical Exam  Constitutional: No distress.  Cardiovascular: Normal rate, regular rhythm and normal heart sounds.   Pulmonary/Chest:  Effort normal and breath sounds normal.  Musculoskeletal:  No midline spine tenderness, no midline spine step-off, left mid posterior lateral ribs with tenderness, no bony defects, no flail chest  Neurological: She is alert. Gait normal.  Mild decreased sensation bilateral feet  Skin: Skin is warm and dry. She is not diaphoretic.     Assessment/Plan: Please see individual problem list.  Essential hypertension At goal. Continue current medications.  Neuropathy due to chemotherapeutic drug ([place]) Continues to have issues with this. She does not want start on any oral medications. Discussed that she could continue the capsaicin lotion. She'll continue to monitor.  History of breast cancer in female Overall doing well. She is back on tamoxifen which has caused some body aches. She is following with oncology. She'll proceed with mammogram in June.  Rib pain on left side Patient with injury several months ago and had pain in this area that resolved. Has had recurrence of this discomfort. Could be related to prior injury though given her history of breast cancer warrants x-ray to evaluate for other bony lesions. We will contact her with the x-ray results.   Orders Placed This Encounter  Procedures  . DG Ribs Unilateral W/Chest Left    Standing Status:   Future    Number of Occurrences:   1    Standing Expiration Date:   10/24/2017    Order Specific Question:   Reason for Exam (SYMPTOM  OR DIAGNOSIS REQUIRED)    Answer:   left rib injury 4 months ago, now with recurrence of pain, history breast cancer    Order Specific Question:   Preferred imaging location?    Answer:   [facility]   Tommi Rumps, MD [place]

## 2016-08-31 NOTE — Telephone Encounter (Signed)
Referral placed to general surgery.

## 2016-10-18 ENCOUNTER — Inpatient Hospital Stay: Payer: Medicare Other

## 2016-10-26 NOTE — Progress Notes (Deleted)
Hematology/Oncology Consult note [facility]  Telephone:([phone] [phone]  Patient Care Team: Leone Haven, MD as PCP - General (Family Medicine)   Name of the patient: Amanda Liu  [id number]  Dec 07, 1955   Date of visit: 10/26/16  Diagnosis- Left  breast cancer Stage IIIB ypT2N2a  Chief complaint/ Reason for visit- routine f/u of breast cancer Stage IIIB ypT2N2a  Heme/Onc history: The patient presented in 2013 with a palpable/visible mass. Mammogram on 07/19/2011 revealed a 3 x 2.6 cm irregular mass in the superior left breast at the 12:00 position. There were microcalcifications associated with the mass.   PET scan on 08/09/2011 revealed a hypermetabolic mass in the left breast consistent with patient's knownmalignancy. There was mild hypermetabolic activity in the left axillary lymph nodes. Therewas no evidence of metastatic disease. There were indeterminate pulmonary nodules 5 mm or less below the resolution of PET.  Left breast biopsy on 08/16/2011 revealed invasive mammary carcinoma. Estrogen receptor was positive (>90%) progesterone receptor positive (>90%) and HER-2/neu negative.  She received neoadjuvant chemotherapy consisting of AC x 4 every 3 weeks (08/31/2011 - 11/02/2011) and weekly Taxol x 12 (11/23/2011 - 02/08/2012). She tolerated her chemotherapy well. 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  She underwent left partial mastectomy and sentinel lymph node biopsy by Dr. Ninetta Lights 03/16/2012. 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 Tumor invaded the dermis without skin ulceration. 5 of 11 lymph nodes were positive for macro metastasis. Pathologic stage was ypT2N2a (stage IIIB).  She received radiation from 03/2012 - 05/2012. She was on Armidex for 2 1/2 years beginning 05/2012. She developed bone pain and fatigue. She states that she was  taken off Arimidex for30 days beginning 12/08/2014. She was prescribed Aromasin, but did not take as it costtoo much. Tamoxifen was started in 12/2014.   Interval history- ***  ECOG PS- *** Pain scale- *** Opioid associated constipation- ***  Review of systems- Review of Systems  Constitutional: Negative for chills, fever, malaise/fatigue and weight loss.  HENT: Negative for congestion, ear discharge and nosebleeds.   Eyes: Negative for blurred vision.  Respiratory: Negative for cough, hemoptysis, sputum production, shortness of breath and wheezing.   Cardiovascular: Negative for chest pain, palpitations, orthopnea and claudication.  Gastrointestinal: Negative for abdominal pain, blood in stool, constipation, diarrhea, heartburn, melena, nausea and vomiting.  Genitourinary: Negative for dysuria, flank pain, frequency, hematuria and urgency.  Musculoskeletal: Negative for back pain, joint pain and myalgias.  Skin: Negative for rash.  Neurological: Negative for dizziness, tingling, focal weakness, seizures, weakness and headaches.  Endo/Heme/Allergies: Does not bruise/bleed easily.  Psychiatric/Behavioral: Negative for depression and suicidal ideas. The patient does not have insomnia.        No Known Allergies   Past Medical History:  Diagnosis Date  . Allergic rhinitis   . Breast cancer ([place]) 02/2012   left breast lumpectomy, radiation and chemotherapy  . Chickenpox   . Elevated blood pressure   . Fatigue due to depression 06/18/2015  . UTI (lower urinary tract infection)      Past Surgical History:  Procedure Laterality Date  . ABDOMINAL HYSTERECTOMY    . BREAST BIOPSY Left 12/2011   positive  . BREAST EXCISIONAL BIOPSY Left 02/2012   positive  . TONSILLECTOMY      Social History   Social History  . Marital status: Married    Spouse name: N/A  . Number of children: N/A  .  Years of education: N/A   Occupational History  . Not on file.   Social History  Main Topics  . Smoking status: Former Research scientist (life sciences)  . Smokeless tobacco: Never Used  . Alcohol use No  . Drug use: No  . Sexual activity: Yes   Other Topics Concern  . Not on file   Social History Narrative  . No narrative on file    Family History  Problem Relation Age of Onset  . Breast cancer Sister 52  . Breast cancer Maternal Grandmother 80  . Alcoholism Unknown        Parent     Current Outpatient Prescriptions:  .  amLODipine (NORVASC) 5 MG tablet, Take 1 tablet (5 mg total) by mouth daily., Disp: 90 tablet, Rfl: 3 .  hydrochlorothiazide (HYDRODIURIL) 25 MG tablet, Take 1 tablet (25 mg total) by mouth daily., Disp: 90 tablet, Rfl: 3 .  lisinopril (PRINIVIL,ZESTRIL) 10 MG tablet, Take 1 tablet (10 mg total) by mouth daily., Disp: 90 tablet, Rfl: 3 .  tamoxifen (NOLVADEX) 20 MG tablet, TAKE 1 TABLET(20 MG) BY MOUTH DAILY, Disp: 90 tablet, Rfl: 0  Physical exam: There were no vitals filed for this visit. Physical Exam  Constitutional: She is oriented to person, place, and time and well-developed, well-nourished, and in no distress.  HENT:  Head: Normocephalic and atraumatic.  Eyes: EOM are normal. Pupils are equal, round, and reactive to light.  Neck: Normal range of motion.  Cardiovascular: Normal rate, regular rhythm and normal heart sounds.   Pulmonary/Chest: Effort normal and breath sounds normal.  Abdominal: Soft. Bowel sounds are normal.  Neurological: She is alert and oriented to person, place, and time.  Skin: Skin is warm and dry.     CMP Latest Ref Rng & Units 06/28/2016  Glucose 65 - 99 mg/dL 113(H)  BUN 6 - 20 mg/dL 14  Creatinine 0.44 - 1.00 mg/dL 0.55  Sodium 135 - 145 mmol/L 136  Potassium 3.5 - 5.1 mmol/L 3.6  Chloride 101 - 111 mmol/L 103  CO2 22 - 32 mmol/L 25  Calcium 8.9 - 10.3 mg/dL 9.1  Total Protein 6.5 - 8.1 g/dL 6.8  Total Bilirubin 0.3 - 1.2 mg/dL 0.4  Alkaline Phos 38 - 126 U/L 38  AST 15 - 41 U/L 26  ALT 14 - 54 U/L 18   CBC Latest  Ref Rng & Units 06/28/2016  WBC 3.6 - 11.0 K/uL 5.6  Hemoglobin 12.0 - 16.0 g/dL 13.6  Hematocrit 35.0 - 47.0 % 39.7  Platelets 150 - 440 K/uL 175     Assessment and plan- Patient is a 61 y.o. female  with a history of left breast cancer stage IIIB status post neoadjuvant chemotherapy followed by lumpectomy and adjuvant radiation currently on tamoxifen  Continue tamoxifen for total of 10 years until 2024. Bone density scan scheduled for next month. I will see her back in 6 months    Visit Diagnosis 1. History of breast cancer in female   2. Long-term current use of tamoxifen      Dr. Randa Evens, MD, MPH [facility] at [facility] Pager- [phone] 10/26/2016 10:47 AM

## 2016-10-27 ENCOUNTER — Inpatient Hospital Stay: Payer: Medicare Other

## 2016-10-27 ENCOUNTER — Inpatient Hospital Stay: Payer: Medicare Other | Admitting: Oncology

## 2016-11-08 ENCOUNTER — Ambulatory Visit
Admission: RE | Admit: 2016-11-08 | Discharge: 2016-11-08 | Disposition: A | Discharge status: Home / Self Care | Payer: Medicare Other | Source: Ambulatory Visit | Attending: Oncology | Admitting: Oncology

## 2016-11-08 ENCOUNTER — Other Ambulatory Visit: Payer: Self-pay | Admitting: Oncology

## 2016-11-08 DIAGNOSIS — Z780 Asymptomatic menopausal state: Secondary | ICD-10-CM | POA: Insufficient documentation

## 2016-11-08 DIAGNOSIS — M8580 Other specified disorders of bone density and structure, unspecified site: Secondary | ICD-10-CM

## 2016-11-08 DIAGNOSIS — Z170 Estrogen receptor positive status [ER+]: Principal | ICD-10-CM

## 2016-11-08 DIAGNOSIS — C50912 Malignant neoplasm of unspecified site of left female breast: Secondary | ICD-10-CM | POA: Diagnosis not present

## 2016-11-08 DIAGNOSIS — M85852 Other specified disorders of bone density and structure, left thigh: Secondary | ICD-10-CM | POA: Diagnosis not present

## 2016-11-10 ENCOUNTER — Inpatient Hospital Stay: Payer: Medicare Other | Admitting: Oncology

## 2016-11-10 ENCOUNTER — Inpatient Hospital Stay: Payer: Medicare Other

## 2016-11-17 ENCOUNTER — Other Ambulatory Visit: Payer: Self-pay | Admitting: Family Medicine

## 2016-11-17 DIAGNOSIS — C50912 Malignant neoplasm of unspecified site of left female breast: Secondary | ICD-10-CM

## 2016-11-17 DIAGNOSIS — C50919 Malignant neoplasm of unspecified site of unspecified female breast: Secondary | ICD-10-CM

## 2016-11-29 ENCOUNTER — Inpatient Hospital Stay (HOSPITAL_BASED_OUTPATIENT_CLINIC_OR_DEPARTMENT_OTHER): Payer: Medicare Other | Admitting: Oncology

## 2016-11-29 ENCOUNTER — Inpatient Hospital Stay: Payer: Medicare Other | Attending: Oncology

## 2016-11-29 ENCOUNTER — Encounter: Payer: Self-pay | Admitting: Oncology

## 2016-11-29 VITALS — BP 121/82 | HR 58 | Temp 96.9°F | Resp 18 | Wt 180.0 lb

## 2016-11-29 DIAGNOSIS — Z9221 Personal history of antineoplastic chemotherapy: Secondary | ICD-10-CM | POA: Insufficient documentation

## 2016-11-29 DIAGNOSIS — G629 Polyneuropathy, unspecified: Secondary | ICD-10-CM

## 2016-11-29 DIAGNOSIS — C50912 Malignant neoplasm of unspecified site of left female breast: Secondary | ICD-10-CM

## 2016-11-29 DIAGNOSIS — M8580 Other specified disorders of bone density and structure, unspecified site: Secondary | ICD-10-CM

## 2016-11-29 DIAGNOSIS — Z79899 Other long term (current) drug therapy: Secondary | ICD-10-CM | POA: Diagnosis not present

## 2016-11-29 DIAGNOSIS — Z170 Estrogen receptor positive status [ER+]: Secondary | ICD-10-CM | POA: Diagnosis not present

## 2016-11-29 DIAGNOSIS — Z780 Asymptomatic menopausal state: Secondary | ICD-10-CM

## 2016-11-29 DIAGNOSIS — Z95828 Presence of other vascular implants and grafts: Secondary | ICD-10-CM

## 2016-11-29 LAB — COMPREHENSIVE METABOLIC PANEL
ALBUMIN: 4.1 g/dL (ref 3.5–5.0)
ALT: 24 U/L (ref 14–54)
ANION GAP: 8 (ref 5–15)
AST: 30 U/L (ref 15–41)
Alkaline Phosphatase: 32 U/L — ABNORMAL LOW (ref 38–126)
BILIRUBIN TOTAL: 0.4 mg/dL (ref 0.3–1.2)
BUN: 10 mg/dL (ref 6–20)
CO2: 27 mmol/L (ref 22–32)
Calcium: 9.2 mg/dL (ref 8.9–10.3)
Chloride: 102 mmol/L (ref 101–111)
Creatinine, Ser: 0.63 mg/dL (ref 0.44–1.00)
GFR calc Af Amer: >60 mL/min (ref >60)
GFR calc non Af Amer: >60 mL/min (ref >60)
GLUCOSE: 110 mg/dL — AB (ref 65–99)
POTASSIUM: 3.6 mmol/L (ref 3.5–5.1)
SODIUM: 137 mmol/L (ref 135–145)
TOTAL PROTEIN: 6.7 g/dL (ref 6.5–8.1)

## 2016-11-29 LAB — CBC
HEMATOCRIT: 37.4 % (ref 35.0–47.0)
Hemoglobin: 13.2 g/dL (ref 12.0–16.0)
MCH: 33.4 pg (ref 26.0–34.0)
MCHC: 35.4 g/dL (ref 32.0–36.0)
MCV: 94.3 fL (ref 80.0–100.0)
Platelets: 174 10*3/uL (ref 150–440)
RBC: 3.96 MIL/uL (ref 3.80–5.20)
RDW: 13.1 % (ref 11.5–14.5)
WBC: 5.6 10*3/uL (ref 3.6–11.0)

## 2016-11-29 MED ORDER — HEPARIN SOD (PORK) LOCK FLUSH 100 UNIT/ML IV SOLN
500.0000 [IU] | Freq: Once | INTRAVENOUS | Status: AC
Start: 2016-11-29 — End: 2016-11-29
  Administered 2016-11-29: 500 [IU] via INTRAVENOUS

## 2016-11-29 MED ORDER — SODIUM CHLORIDE 0.9% FLUSH
10.0000 mL | INTRAVENOUS | Status: DC | PRN
  Administered 2016-11-29: 10 mL via INTRAVENOUS
  Filled 2016-11-29: qty 10

## 2016-11-29 MED ORDER — TAMOXIFEN CITRATE 20 MG PO TABS: ORAL_TABLET | ORAL | 0 refills | Status: DC

## 2016-11-29 NOTE — Progress Notes (Signed)
Hematology/Oncology Consult note [facility]  Telephone:([phone] [phone]  Patient Care Team: Leone Haven, MD as PCP - General (Family Medicine)   Name of the patient: Amanda Liu  [id number]  1955-08-05   Date of visit: 11/29/16  Diagnosis- Left  breast cancer Stage IIIB ypT2N2a  Chief complaint/ Reason for visit- routine f/u of breast cancer Stage IIIB ypT2N2a  Heme/Onc history: The patient presented in 2013 with a palpable/visible mass. Mammogram on 07/19/2011 revealed a 3 x 2.6 cm irregular mass in the superior left breast at the 12:00 position. There were microcalcifications associated with the mass.   PET scan on 08/09/2011 revealed a hypermetabolic mass in the left breast consistent with patient's knownmalignancy. There was mild hypermetabolic activity in the left axillary lymph nodes. Therewas no evidence of metastatic disease. There were indeterminate pulmonary nodules 5 mm or less below the resolution of PET.  Left breast biopsy on 08/16/2011 revealed invasive mammary carcinoma. Estrogen receptor was positive (>90%) progesterone receptor positive (>90%) and HER-2/neu negative.  She received neoadjuvant chemotherapy consisting of AC x 4 every 3 weeks (08/31/2011 - 11/02/2011) and weekly Taxol x 12 (11/23/2011 - 02/08/2012). She tolerated her chemotherapy well. 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  She underwent left partial mastectomy and sentinel lymph node biopsy by Dr. Ninetta Lights 03/16/2012. 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 Tumor invaded the dermis without skin ulceration. 5 of 11 lymph nodes were positive for macro metastasis. Pathologic stage was ypT2N2a (stage IIIB).  She received radiation from 03/2012 - 05/2012. She was on Armidex for 2 1/2 years beginning 05/2012. She developed bone pain and fatigue. She states that she was  taken off Arimidex for30 days beginning 12/08/2014. She was prescribed Aromasin, but did not take as it costtoo much. Tamoxifen was started in 12/2014.    Interval history- she continues to take tamoxifen albeit with some joint pain which is self limited. Denies other complaints  ECOG PS- 1 Pain scale- 0   Review of systems- Review of Systems  Constitutional: Negative for chills, fever, malaise/fatigue and weight loss.  HENT: Negative for congestion, ear discharge and nosebleeds.   Eyes: Negative for blurred vision.  Respiratory: Negative for cough, hemoptysis, sputum production, shortness of breath and wheezing.   Cardiovascular: Negative for chest pain, palpitations, orthopnea and claudication.  Gastrointestinal: Negative for abdominal pain, blood in stool, constipation, diarrhea, heartburn, melena, nausea and vomiting.  Genitourinary: Negative for dysuria, flank pain, frequency, hematuria and urgency.  Musculoskeletal: Negative for back pain, joint pain and myalgias.  Skin: Negative for rash.  Neurological: Negative for dizziness, tingling, focal weakness, seizures, weakness and headaches.  Endo/Heme/Allergies: Does not bruise/bleed easily.  Psychiatric/Behavioral: Negative for depression and suicidal ideas. The patient does not have insomnia.        No Known Allergies   Past Medical History:  Diagnosis Date  . Allergic rhinitis   . Breast cancer ([place]) 02/2012   left breast lumpectomy, radiation and chemotherapy  . Chickenpox   . Elevated blood pressure   . Fatigue due to depression 06/18/2015  . UTI (lower urinary tract infection)      Past Surgical History:  Procedure Laterality Date  . ABDOMINAL HYSTERECTOMY    . BREAST BIOPSY Left 12/2011   positive  . BREAST EXCISIONAL BIOPSY Left 02/2012   positive  . TONSILLECTOMY      Social History   Social History  . Marital status: Married  Spouse name: N/A  . Number of children: N/A  . Years of education:  N/A   Occupational History  . Not on file.   Social History Main Topics  . Smoking status: Former Research scientist (life sciences)  . Smokeless tobacco: Never Used  . Alcohol use No  . Drug use: No  . Sexual activity: Yes   Other Topics Concern  . Not on file   Social History Narrative  . No narrative on file    Family History  Problem Relation Age of Onset  . Breast cancer Sister 89  . Breast cancer Maternal Grandmother 80  . Alcoholism Unknown        Parent     Current Outpatient Prescriptions:  .  amLODipine (NORVASC) 5 MG tablet, TAKE 1 TABLET(5 MG) BY MOUTH DAILY, Disp: 90 tablet, Rfl: 0 .  hydrochlorothiazide (HYDRODIURIL) 25 MG tablet, TAKE 1 TABLET(25 MG) BY MOUTH DAILY, Disp: 90 tablet, Rfl: 0 .  lisinopril (PRINIVIL,ZESTRIL) 10 MG tablet, TAKE 1 TABLET(10 MG) BY MOUTH DAILY, Disp: 90 tablet, Rfl: 0 .  tamoxifen (NOLVADEX) 20 MG tablet, TAKE 1 TABLET(20 MG) BY MOUTH DAILY, Disp: 90 tablet, Rfl: 0  Physical exam:  Vitals:   11/29/16 0839  BP: 121/82  Pulse: (!) 58  Resp: 18  Temp: (!) 96.9 F (36.1 C)  TempSrc: Tympanic  Weight: 180 lb (81.6 kg)   Physical Exam  Constitutional: She is oriented to person, place, and time and well-developed, well-nourished, and in no distress.  HENT:  Head: Normocephalic and atraumatic.  Eyes: EOM are normal. Pupils are equal, round, and reactive to light.  Neck: Normal range of motion.  Cardiovascular: Normal rate, regular rhythm and normal heart sounds.   Pulmonary/Chest: Effort normal and breath sounds normal.  Abdominal: Soft. Bowel sounds are normal.  Neurological: She is alert and oriented to person, place, and time.  Skin: Skin is warm and dry.   Breast exam was performed in seated and lying down position. Patient is status post left lumpectomy with a well-healed surgical scar. No evidence of any palpable masses. No evidence of axillary adenopathy. No evidence of any palpable masses or lumps in the right breast. No evidence of right  axillary adenopathy   CMP Latest Ref Rng & Units 06/28/2016  Glucose 65 - 99 mg/dL 113(H)  BUN 6 - 20 mg/dL 14  Creatinine 0.44 - 1.00 mg/dL 0.55  Sodium 135 - 145 mmol/L 136  Potassium 3.5 - 5.1 mmol/L 3.6  Chloride 101 - 111 mmol/L 103  CO2 22 - 32 mmol/L 25  Calcium 8.9 - 10.3 mg/dL 9.1  Total Protein 6.5 - 8.1 g/dL 6.8  Total Bilirubin 0.3 - 1.2 mg/dL 0.4  Alkaline Phos 38 - 126 U/L 38  AST 15 - 41 U/L 26  ALT 14 - 54 U/L 18   CBC Latest Ref Rng & Units 06/28/2016  WBC 3.6 - 11.0 K/uL 5.6  Hemoglobin 12.0 - 16.0 g/dL 13.6  Hematocrit 35.0 - 47.0 % 39.7  Platelets 150 - 440 K/uL 175    No images are attached to the encounter.  Dg Bone Density  Result Date: 11/08/2016 EXAM: DUAL X-RAY ABSORPTIOMETRY (DXA) FOR BONE MINERAL DENSITY IMPRESSION: Dear Dr. Janese Banks, Your patient Teyonna Plaisted completed a BMD test on 11/08/2016 using the [place] (analysis version: 14.10) manufactured by [employer]. The following summarizes the results of our evaluation. PATIENT BIOGRAPHICAL: Name: Jazmyn, Offner Patient ID: [id number] Birth Date: [birth year] Height: 64.5 in. Gender: Female Exam Date:  11/08/2016 Weight: 179.2 lbs. Indications: Breast CA, High Risk Meds, Hysterectomy, Caucasian, Height Loss, Postmenopausal Fractures: Treatments: TAMOXIFEN ASSESSMENT: The BMD measured at Femur Neck Left is 0.842 g/cm2 with a T-score of -1.4. This patient is considered osteopenic according to [facility]) criteria. Site Region Measured Measured WHO Young Adult BMD Date       Age      Classification T-score AP Spine L1-L4 11/08/2016 61.3 Normal 0.5 1.254 g/cm2 AP Spine L1-L4 10/28/2014 59.2 Normal 0.2 1.215 g/cm2 DualFemur Neck Left 11/08/2016 61.3 Osteopenia -1.4 0.842 g/cm2 DualFemur Neck Left 10/28/2014 59.2 Osteopenia -1.4 0.837 g/cm2 World Health Organization [facility]) criteria for post-menopausal, Caucasian Women: Normal:       T-score at or above -1 SD Osteopenia:   T-score between  -1 and -2.5 SD Osteoporosis: T-score at or below -2.5 SD RECOMMENDATIONS: [place] recommends that FDA-approved medical therapies be considered in postmenopausal women and men age 74 or older with a: 1. Hip or vertebral (clinical or morphometric) fracture. 2. T-score of < -2.5 at the spine or hip. 3. Ten-year fracture probability by FRAX of 3% or greater for hip fracture or 20% or greater for major osteoporotic fracture. All treatment decisions require clinical judgment and consideration of individual patient factors, including patient preferences, co-morbidities, previous drug use, risk factors not captured in the FRAX model (e.g. falls, vitamin D deficiency, increased bone turnover, interval significant decline in bone density) and possible under - or over-estimation of fracture risk by FRAX. All patients should ensure an adequate intake of dietary calcium (1200 mg/d) and vitamin D (800 IU daily) unless contraindicated. FOLLOW-UP: People with diagnosed cases of osteoporosis or at high risk for fracture should have regular bone mineral density tests. For patients eligible for Medicare, routine testing is allowed once every 2 years. The testing frequency can be increased to one year for patients who have rapidly progressing disease, those who are receiving or discontinuing medical therapy to restore bone mass, or have additional risk factors. I have reviewed this report, and agree with the above findings. [facility] Dear Dr. Janese Banks, Your patient TELENA PEYSER completed a FRAX assessment on 11/08/2016 using the [place] (analysis version: 14.10) manufactured by [employer]. The following summarizes the results of our evaluation. PATIENT BIOGRAPHICAL: Name: Soyla, Bainter Patient ID: [id number] Birth Date: 12-13-1955 Height:    64.5 in. Gender:     Female    [age]       Weight:    179.2 lbs. Ethnicity:  White                            Exam Date: 11/08/2016  FRAX* RESULTS:  (version: 3.5) 10-year Probability of Fracture1 Major Osteoporotic Fracture2 Hip Fracture 7.8% 0.6% Population: [place] (Caucasian) Risk Factors: None Based on Femur (Left) Neck BMD 1 -The 10-year probability of fracture may be lower than reported if the patient has received treatment. 2 -Major Osteoporotic Fracture: Clinical Spine, Forearm, Hip or Shoulder *FRAX is a Materials engineer of the [employer] of [employer] for Metabolic Bone Disease, a [place] (WHO) [facility]. ASSESSMENT: The probability of a major osteoporotic fracture is 7.8% within the next ten years. The probability of a hip fracture is 0.6% within the next ten years. . Electronically Signed   By: Lajean Manes M.D.   On: 11/08/2016 10:13     Assessment and  plan- Patient is a 61 y.o. female with a history of left breast cancer stage IIIB status post neoadjuvant chemotherapy followed by lumpectomy and adjuvant radiation  1. Left breast cancer- mammogram next month. Clinically she is doing well and there is no evidence of recurrence on todays exam. She will continue tamoxifen for total period of 10 years.  2. Osteopenia- recent bone density scan from 11/08/2016 showed a T score of -1.4 at the left femur neck consistent with osteopenia. This has remained overall stable as compared to 2016. 10 year probability of major fracture was 7.8% and that a hip fracture was 0.6%. She does not need adjuvant bisphosphonates for this at this time. She will continue to take calcium 1200 mg per day along with vitamin D 800 international units.  rtc in 6 months with labs   Visit Diagnosis 1. Osteopenia, unspecified location   2. Malignant neoplasm of left breast in female, estrogen receptor positive, unspecified site of breast ([place])      Dr. Randa Evens, MD, MPH [facility] at [facility] Pager- [phone] 11/29/2016 12:32 PM

## 2016-11-29 NOTE — Progress Notes (Signed)
Patient here today for follow up regarding breast cancer . 

## 2016-11-30 LAB — CANCER ANTIGEN 27.29: CAN 27.29: 5.4 U/mL (ref 0.0–38.6)

## 2016-11-30 LAB — CANCER ANTIGEN 15-3: CAN 15 3: 4.8 U/mL (ref 0.0–25.0)

## 2016-12-14 ENCOUNTER — Other Ambulatory Visit: Payer: Medicare Other

## 2017-01-10 ENCOUNTER — Inpatient Hospital Stay: Payer: Medicare Other | Attending: Oncology

## 2017-01-10 DIAGNOSIS — C50912 Malignant neoplasm of unspecified site of left female breast: Secondary | ICD-10-CM | POA: Diagnosis not present

## 2017-01-10 DIAGNOSIS — Z170 Estrogen receptor positive status [ER+]: Secondary | ICD-10-CM | POA: Diagnosis not present

## 2017-01-10 DIAGNOSIS — Z452 Encounter for adjustment and management of vascular access device: Secondary | ICD-10-CM | POA: Insufficient documentation

## 2017-01-10 MED ORDER — SODIUM CHLORIDE 0.9% FLUSH
10.0000 mL | INTRAVENOUS | Status: DC | PRN
  Administered 2017-01-10: 10 mL via INTRAVENOUS
  Filled 2017-01-10: qty 10

## 2017-01-10 MED ORDER — HEPARIN SOD (PORK) LOCK FLUSH 100 UNIT/ML IV SOLN
500.0000 [IU] | Freq: Once | INTRAVENOUS | Status: AC
  Administered 2017-01-10: 500 [IU] via INTRAVENOUS

## 2017-01-18 ENCOUNTER — Other Ambulatory Visit: Payer: Medicare Other

## 2017-02-15 ENCOUNTER — Other Ambulatory Visit: Payer: Self-pay | Admitting: Oncology

## 2017-02-15 ENCOUNTER — Other Ambulatory Visit: Payer: Self-pay | Admitting: Family Medicine

## 2017-02-15 DIAGNOSIS — C50912 Malignant neoplasm of unspecified site of left female breast: Secondary | ICD-10-CM

## 2017-02-15 DIAGNOSIS — C50919 Malignant neoplasm of unspecified site of unspecified female breast: Secondary | ICD-10-CM

## 2017-02-21 ENCOUNTER — Inpatient Hospital Stay: Payer: Medicare Other | Attending: Oncology

## 2017-02-24 ENCOUNTER — Ambulatory Visit: Payer: Medicare Other | Admitting: Family Medicine

## 2017-03-07 ENCOUNTER — Ambulatory Visit: Admission: RE | Admit: 2017-03-07 | Payer: Medicare Other | Source: Ambulatory Visit

## 2017-03-07 ENCOUNTER — Ambulatory Visit
Admission: RE | Admit: 2017-03-07 | Discharge: 2017-03-07 | Disposition: A | Discharge status: Home / Self Care | Payer: Medicare Other | Source: Ambulatory Visit | Attending: Oncology | Admitting: Oncology

## 2017-03-07 DIAGNOSIS — Z08 Encounter for follow-up examination after completed treatment for malignant neoplasm: Secondary | ICD-10-CM | POA: Diagnosis not present

## 2017-03-07 DIAGNOSIS — M8580 Other specified disorders of bone density and structure, unspecified site: Secondary | ICD-10-CM | POA: Insufficient documentation

## 2017-03-07 DIAGNOSIS — Z780 Asymptomatic menopausal state: Secondary | ICD-10-CM | POA: Insufficient documentation

## 2017-03-07 DIAGNOSIS — Z853 Personal history of malignant neoplasm of breast: Secondary | ICD-10-CM | POA: Diagnosis not present

## 2017-03-07 DIAGNOSIS — C50912 Malignant neoplasm of unspecified site of left female breast: Secondary | ICD-10-CM | POA: Diagnosis present

## 2017-03-07 DIAGNOSIS — R928 Other abnormal and inconclusive findings on diagnostic imaging of breast: Secondary | ICD-10-CM | POA: Diagnosis not present

## 2017-03-07 DIAGNOSIS — Z170 Estrogen receptor positive status [ER+]: Secondary | ICD-10-CM | POA: Diagnosis present

## 2017-03-07 HISTORY — DX: Personal history of antineoplastic chemotherapy: Z92.21

## 2017-03-07 HISTORY — DX: Personal history of irradiation: Z92.3

## 2017-04-04 ENCOUNTER — Inpatient Hospital Stay: Payer: Medicare Other | Attending: Oncology

## 2017-05-01 ENCOUNTER — Ambulatory Visit (INDEPENDENT_AMBULATORY_CARE_PROVIDER_SITE_OTHER): Payer: Medicare Other

## 2017-05-01 VITALS — BP 110/70 | HR 77 | Temp 98.3°F | Resp 14 | Ht 65.5 in | Wt 183.8 lb

## 2017-05-01 DIAGNOSIS — Z0000 Encounter for general adult medical examination without abnormal findings: Secondary | ICD-10-CM | POA: Diagnosis not present

## 2017-05-01 DIAGNOSIS — Z1159 Encounter for screening for other viral diseases: Secondary | ICD-10-CM

## 2017-05-01 DIAGNOSIS — Z23 Encounter for immunization: Secondary | ICD-10-CM

## 2017-05-01 DIAGNOSIS — Z1331 Encounter for screening for depression: Secondary | ICD-10-CM | POA: Diagnosis not present

## 2017-05-01 NOTE — Patient Instructions (Addendum)
  Ms. Hermida , Thank you for taking time to come for your Medicare Wellness Visit. I appreciate your ongoing commitment to your health goals. Please review the following plan we discussed and let me know if I can assist you in the future.   Follow up with Dr. Caryl Bis as needed.    Bring a copy of your [place] and/or Living Will to be scanned into chart.  Have a great day!  These are the goals we discussed: Goals    . Healthy Lifestyle     Stay hydrated Walk for exercise Low carb foods       This is a list of the screening recommended for you and due dates:  Health Maintenance  Topic Date Due  .  Hepatitis C: One time screening is recommended by Center for Disease Control  (CDC) for  adults born from [birth year] through 1965.   Aug 24, 1955  . HIV Screening  07/19/1970  . Tetanus Vaccine  07/19/1974  . Colon Cancer Screening  07/19/2005  . Flu Shot  12/28/2016  . Pap Smear  04/30/2019*  . Mammogram  03/08/2019  *Topic was postponed. The date shown is not the original due date.

## 2017-05-01 NOTE — Progress Notes (Signed)
Subjective:   Amanda Liu is a 61 y.o. female who presents for Medicare Annual (Subsequent) preventive examination.  Review of Systems:  No ROS.  Medicare Wellness Visit. Additional risk factors are reflected in the social history. Cardiac Risk Factors include: hypertension     Objective:     Vitals: BP 110/70 (BP Location: Right Arm, Patient Position: Sitting, Cuff Size: Normal)   Pulse 77   Temp 98.3 F (36.8 C) (Oral)   Resp 14   Ht 5' 5.5" (1.664 m)   Wt 183 lb 12.8 oz (83.4 kg)   BMI 30.12 kg/m   Body mass index is 30.12 kg/m.  Advanced Directives 05/01/2017 11/29/2016 06/28/2016 04/29/2016 02/19/2016 10/21/2015 08/21/2015  Does Patient Have a Medical Advance Directive? No No No No No No No  Does patient want to make changes to medical advance directive? No - Patient declined - - - - - -  Would patient like information on creating a medical advance directive? - - - - No - patient declined information - -    Tobacco Social History   Tobacco Use  Smoking Status Former Smoker  Smokeless Tobacco Never Used     Counseling given: Not Answered   Clinical Intake:  Pre-visit preparation completed: Yes  Pain : No/denies pain     Diabetes: No  Activities of Daily Living: Independent Ambulation: Independent Medication Administration: Independent Home Management: Independent     Do you feel unsafe in your current relationship?: No Do you feel physically threatened by others?: No Anyone hurting you at home, work, or school?: No Unable to ask?: No  How often do you need to have someone help you when you read instructions, pamphlets, or other written materials from your doctor or pharmacy?: 1 - Never  Interpreter Needed?: No     Past Medical History:  Diagnosis Date  . Allergic rhinitis   . Breast cancer ([place]) 02/2012   left breast lumpectomy, radiation and chemotherapy  . Chickenpox   . Elevated blood pressure   . Fatigue due to depression 06/18/2015  .  Personal history of chemotherapy 08/2011   left breast ca  . Personal history of radiation therapy 03/2012-05/2012   left breast ca  . UTI (lower urinary tract infection)    Past Surgical History:  Procedure Laterality Date  . ABDOMINAL HYSTERECTOMY    . BREAST BIOPSY Left 07/2011   left breast invasive mammary carcinoma  . BREAST LUMPECTOMY Left 03/16/2012   invasive mammary carcinoma: invasive ductal carcinoma. clear margins after reexcision. 5 LN positive for macrometastasis  . TONSILLECTOMY     Family History  Problem Relation Age of Onset  . Breast cancer Sister 31  . Breast cancer Maternal Grandmother 80  . Alcoholism Unknown        Parent  . Alcohol abuse Father   . Post-traumatic stress disorder Daughter        mental/emotional abuse  . Eating disorder Daughter    Social History   Socioeconomic History  . Marital status: Married    Spouse name: None  . Number of children: None  . Years of education: None  . Highest education level: None  Social Needs  . Financial resource strain: None  . Food insecurity - worry: None  . Food insecurity - inability: None  . Transportation needs - medical: None  . Transportation needs - non-medical: None  Occupational History  . None  Tobacco Use  . Smoking status: Former Research scientist (life sciences)  . Smokeless tobacco: Never  Used  Substance and Sexual Activity  . Alcohol use: No    Alcohol/week: 0.0 oz  . Drug use: No  . Sexual activity: Yes  Other Topics Concern  . None  Social History Narrative  . None    Outpatient Encounter Medications as of 05/01/2017  Medication Sig  . amLODipine (NORVASC) 5 MG tablet TAKE 1 TABLET(5 MG) BY MOUTH DAILY  . hydrochlorothiazide (HYDRODIURIL) 25 MG tablet TAKE 1 TABLET(25 MG) BY MOUTH DAILY  . lisinopril (PRINIVIL,ZESTRIL) 10 MG tablet TAKE 1 TABLET(10 MG) BY MOUTH DAILY  . tamoxifen (NOLVADEX) 20 MG tablet TAKE 1 TABLET(20 MG) BY MOUTH DAILY  . [DISCONTINUED] tamoxifen (NOLVADEX) 20 MG tablet TAKE 1  TABLET(20 MG) BY MOUTH DAILY   No facility-administered encounter medications on file as of 05/01/2017.     Activities of Daily Living In your present state of health, do you have any difficulty performing the following activities: 05/01/2017  Hearing? N  Vision? N  Difficulty concentrating or making decisions? N  Walking or climbing stairs? Y  Comment Leg weakness  Dressing or bathing? N  Doing errands, shopping? N  Preparing Food and eating ? N  Using the Toilet? N  In the past six months, have you accidently leaked urine? N  Do you have problems with loss of bowel control? N  Managing your Medications? N  Managing your Finances? N  Housekeeping or managing your Housekeeping? N  Some recent data might be hidden    Patient Care Team: Leone Haven, MD as PCP - General (Family Medicine)    Assessment:    This is a routine wellness examination for Amanda Liu. The goal of the wellness visit is to assist the patient how to close the gaps in care and create a preventative care plan for the patient.   The roster of all physicians providing medical care to patient is listed in the Snapshot section of the chart.  Osteoporosis risk reviewed.    Safety issues reviewed; Smoke and carbon monoxide detectors in the home. No firearms in the home.  Wears seatbelts when driving or riding with others. Patient does wear sunscreen or protective clothing when in direct sunlight. No violence in the home.  Depression- PHQ 2 &9 complete.  No signs/symptoms or verbal communication regarding little pleasure in doing things, feeling down, depressed or hopeless. No changes in sleeping, energy, eating, concentrating.  No thoughts of self harm or harm towards others.  Time spent on this topic is 11 minutes.   Patient is alert, normal appearance, oriented to person/place/and time. Correctly identified the president of the [place], recall of 3/3 words, and performing simple calculations. Displays appropriate  judgement and can read correct time from watch face.   No new identified risk were noted.  No failures at ADL's or IADL's.    BMI- discussed the importance of a healthy diet, water intake and the benefits of aerobic exercise. Educational material provided.   24 hour diet recall: Breakfast: fruit Lunch: bananna sandwich Dinner: carrots  Snack: pickles  Daily fluid intake: 2 cups of caffeine, 2 cups of water  Eye- Visual acuity not assessed per patient preference since they have regular follow up with the ophthalmologist.  Wears corrective lenses.  Sleep patterns- Sleeps 6 hours at night.  Wakes feeling rested.  Influenza vaccine administered R deltoid, tolerated well. Educational material provided.  TDAP vaccine deferred per patient preference.  Follow up with insurance.  Educational material provided.  Hepatitis C Screening discussed.  Neurosurgeon  provided.  Future lab placed.  Colonoscopy and Cologuard discussed; deferred per patient request.  Educational material provided.  Patient Concerns: None at this time. Follow up with PCP as needed.  Exercise Activities and Dietary recommendations Current Exercise Habits: Home exercise routine, Type of exercise: walking, Time (Minutes): 20, Frequency (Times/Week): 7, Weekly Exercise (Minutes/Week): 140, Intensity: Moderate  Goals    . Healthy Lifestyle     Stay hydrated Walk for exercise Low carb foods      Fall Risk Fall Risk  05/01/2017 04/29/2016  Falls in the past year? No No   Depression Screen PHQ 2/9 Scores 05/01/2017 04/29/2016  PHQ - 2 Score 0 0  PHQ- 9 Score 0 -     Cognitive Function MMSE - Mini Mental State Exam 05/01/2017  Orientation to time 5  Orientation to Place 5  Registration 3  Attention/ Calculation 5  Recall 3  Language- name 2 objects 2  Language- repeat 1  Language- follow 3 step command 3  Language- read & follow direction 1  Write a sentence 1  Copy design 1  Total score 30       6CIT Screen 04/29/2016  What Year? 0 points  What month? 0 points  What time? 0 points  Count back from 20 0 points  Months in reverse 0 points  Repeat phrase 0 points  Total Score 0    Immunization History  Administered Date(s) Administered  . Influenza,inj,Quad PF,6+ Mos 02/25/2015, 03/18/2016, 05/01/2017   Screening Tests Health Maintenance  Topic Date Due  . Hepatitis C Screening  1955/06/23  . HIV Screening  07/19/1970  . TETANUS/TDAP  07/19/1974  . COLONOSCOPY  07/19/2005  . INFLUENZA VACCINE  12/28/2016  . PAP SMEAR  04/30/2019 (Originally 07/19/1976)  . MAMMOGRAM  03/08/2019      Plan:   End of life planning; Advanced aging; Advanced directives discussed.  No HCPOA/Living Will.  Additional information declined at this time.  I have personally reviewed and noted the following in the patient's chart:   . Medical and social history . Use of alcohol, tobacco or illicit drugs  . Current medications and supplements . Functional ability and status . Nutritional status . Physical activity . Advanced directives . List of other physicians . Hospitalizations, surgeries, and ER visits in previous 12 months . Vitals . Screenings to include cognitive, depression, and falls . Referrals and appointments  In addition, I have reviewed and discussed with patient certain preventive protocols, quality metrics, and best practice recommendations. A written personalized care plan for preventive services as well as general preventive health recommendations were provided to patient.     Varney Biles, LPN  [id number]

## 2017-05-16 ENCOUNTER — Inpatient Hospital Stay: Payer: Medicare Other | Attending: Oncology

## 2017-05-21 ENCOUNTER — Other Ambulatory Visit: Payer: Self-pay | Admitting: Family Medicine

## 2017-05-21 DIAGNOSIS — C50912 Malignant neoplasm of unspecified site of left female breast: Secondary | ICD-10-CM

## 2017-05-21 DIAGNOSIS — C50919 Malignant neoplasm of unspecified site of unspecified female breast: Secondary | ICD-10-CM

## 2017-06-01 ENCOUNTER — Inpatient Hospital Stay: Payer: Medicare Other | Attending: Oncology

## 2017-06-01 ENCOUNTER — Other Ambulatory Visit: Payer: Self-pay

## 2017-06-01 ENCOUNTER — Inpatient Hospital Stay (HOSPITAL_BASED_OUTPATIENT_CLINIC_OR_DEPARTMENT_OTHER): Payer: Medicare Other | Admitting: Oncology

## 2017-06-01 ENCOUNTER — Encounter: Payer: Self-pay | Admitting: Oncology

## 2017-06-01 VITALS — BP 119/87 | HR 92 | Temp 97.2°F | Wt 179.8 lb

## 2017-06-01 DIAGNOSIS — Z79899 Other long term (current) drug therapy: Secondary | ICD-10-CM

## 2017-06-01 DIAGNOSIS — G629 Polyneuropathy, unspecified: Secondary | ICD-10-CM | POA: Diagnosis not present

## 2017-06-01 DIAGNOSIS — C50912 Malignant neoplasm of unspecified site of left female breast: Secondary | ICD-10-CM

## 2017-06-01 DIAGNOSIS — R5383 Other fatigue: Secondary | ICD-10-CM

## 2017-06-01 DIAGNOSIS — D0512 Intraductal carcinoma in situ of left breast: Secondary | ICD-10-CM | POA: Diagnosis not present

## 2017-06-01 DIAGNOSIS — Z87891 Personal history of nicotine dependence: Secondary | ICD-10-CM | POA: Insufficient documentation

## 2017-06-01 DIAGNOSIS — Z90710 Acquired absence of both cervix and uterus: Secondary | ICD-10-CM | POA: Insufficient documentation

## 2017-06-01 DIAGNOSIS — I10 Essential (primary) hypertension: Secondary | ICD-10-CM | POA: Diagnosis not present

## 2017-06-01 DIAGNOSIS — Z79810 Long term (current) use of selective estrogen receptor modulators (SERMs): Secondary | ICD-10-CM | POA: Diagnosis not present

## 2017-06-01 DIAGNOSIS — Z9221 Personal history of antineoplastic chemotherapy: Secondary | ICD-10-CM | POA: Diagnosis not present

## 2017-06-01 DIAGNOSIS — Z87440 Personal history of urinary (tract) infections: Secondary | ICD-10-CM

## 2017-06-01 DIAGNOSIS — M8580 Other specified disorders of bone density and structure, unspecified site: Secondary | ICD-10-CM | POA: Diagnosis not present

## 2017-06-01 DIAGNOSIS — Z08 Encounter for follow-up examination after completed treatment for malignant neoplasm: Secondary | ICD-10-CM

## 2017-06-01 DIAGNOSIS — Z923 Personal history of irradiation: Secondary | ICD-10-CM

## 2017-06-01 DIAGNOSIS — F329 Major depressive disorder, single episode, unspecified: Secondary | ICD-10-CM

## 2017-06-01 DIAGNOSIS — Z9012 Acquired absence of left breast and nipple: Secondary | ICD-10-CM | POA: Insufficient documentation

## 2017-06-01 DIAGNOSIS — Z170 Estrogen receptor positive status [ER+]: Secondary | ICD-10-CM | POA: Diagnosis not present

## 2017-06-01 DIAGNOSIS — Z853 Personal history of malignant neoplasm of breast: Secondary | ICD-10-CM

## 2017-06-01 LAB — COMPREHENSIVE METABOLIC PANEL
ALBUMIN: 4.2 g/dL (ref 3.5–5.0)
ALT: 28 U/L (ref 14–54)
ANION GAP: 11 (ref 5–15)
AST: 37 U/L (ref 15–41)
Alkaline Phosphatase: 42 U/L (ref 38–126)
BUN: 11 mg/dL (ref 6–20)
CHLORIDE: 98 mmol/L — AB (ref 101–111)
CO2: 26 mmol/L (ref 22–32)
Calcium: 9.2 mg/dL (ref 8.9–10.3)
Creatinine, Ser: 0.65 mg/dL (ref 0.44–1.00)
GFR calc Af Amer: >60 mL/min (ref >60)
GFR calc non Af Amer: >60 mL/min (ref >60)
GLUCOSE: 129 mg/dL — AB (ref 65–99)
POTASSIUM: 3.2 mmol/L — AB (ref 3.5–5.1)
SODIUM: 135 mmol/L (ref 135–145)
Total Bilirubin: 0.6 mg/dL (ref 0.3–1.2)
Total Protein: 7.2 g/dL (ref 6.5–8.1)

## 2017-06-01 LAB — CBC WITH DIFFERENTIAL/PLATELET
Basophils Absolute: 0.1 10*3/uL (ref 0–0.1)
Basophils Relative: 1 %
Eosinophils Absolute: 0.3 10*3/uL (ref 0–0.7)
Eosinophils Relative: 4 %
HEMATOCRIT: 41.5 % (ref 35.0–47.0)
Hemoglobin: 14.3 g/dL (ref 12.0–16.0)
LYMPHS PCT: 21 %
Lymphs Abs: 1.4 10*3/uL (ref 1.0–3.6)
MCH: 32.8 pg (ref 26.0–34.0)
MCHC: 34.5 g/dL (ref 32.0–36.0)
MCV: 95.1 fL (ref 80.0–100.0)
MONO ABS: 0.4 10*3/uL (ref 0.2–0.9)
MONOS PCT: 6 %
NEUTROS ABS: 4.5 10*3/uL (ref 1.4–6.5)
Neutrophils Relative %: 68 %
Platelets: 202 10*3/uL (ref 150–440)
RBC: 4.37 MIL/uL (ref 3.80–5.20)
RDW: 12.9 % (ref 11.5–14.5)
WBC: 6.6 10*3/uL (ref 3.6–11.0)

## 2017-06-01 NOTE — Progress Notes (Signed)
Hematology/Oncology Consult note [facility]  Telephone:([phone] [phone]  Patient Care Team: Leone Haven, MD as PCP - General (Family Medicine)   Name of the patient: Amanda Liu  [id number]  [birth year]   Date of visit: 06/01/17  Diagnosis- Left breast cancer Stage IIIB ypT2N2a  Chief complaint/ Reason for visit- routine f/u of breast cancer Stage IIIB ypT2N2a  Heme/Onc history:The patient presented in 2013 with a palpable/visible mass. Mammogram on 07/19/2011 revealed a 3 x 2.6 cm irregular mass in the superior left breast at the 12:00 position. There were microcalcifications associated with the mass.   PET scan on 08/09/2011 revealed a hypermetabolic mass in the left breast consistent with patient's knownmalignancy. There was mild hypermetabolic activity in the left axillary lymph nodes. Therewas no evidence of metastatic disease. There were indeterminate pulmonary nodules 5 mm or less below the resolution of PET.  Left breast biopsy on 08/16/2011 revealed invasive mammary carcinoma. Estrogen receptor was positive (>90%) progesterone receptor positive (>90%) and HER-2/neu negative.  She received neoadjuvant chemotherapy consisting of AC x 4 every 3 weeks (08/31/2011 - 11/02/2011) and weekly Taxol x 12 (11/23/2011 - 02/08/2012). She tolerated her chemotherapy well. 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  She underwent left partial mastectomy and sentinel lymph node biopsy by Dr. Ninetta Lights 03/16/2012. 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 Tumor invaded the dermis without skin ulceration. 5 of 11 lymph nodes were positive for macro metastasis. Pathologic stage was ypT2N2a (stage IIIB).  She received radiation from 03/2012 - 05/2012. She was on Armidex for 2 1/2 years beginning 05/2012. She developed bone pain and fatigue. She states that she was  taken off Arimidex for30 days beginning 12/08/2014. She was prescribed Aromasin, but did not take as it costtoo much. Tamoxifen was started in 12/2014.    Interval history- Patient does complain of fatigue and aches in her bilateral legs while on tamoxifen. She has been on hormone therapy for 5 years now. She has also been experiencing some personal stressors and she is unsure how much of her symptoms is because of tamoxifen versus emotional stress. She would like to take a break from tamoxifen and then consider restarting it  ECOG PS- 0 Pain scale- 0  Review of systems- Review of Systems  Constitutional: Negative for chills, fever, malaise/fatigue and weight loss.  HENT: Negative for congestion, ear discharge and nosebleeds.   Eyes: Negative for blurred vision.  Respiratory: Negative for cough, hemoptysis, sputum production, shortness of breath and wheezing.   Cardiovascular: Negative for chest pain, palpitations, orthopnea and claudication.  Gastrointestinal: Negative for abdominal pain, blood in stool, constipation, diarrhea, heartburn, melena, nausea and vomiting.  Genitourinary: Negative for dysuria, flank pain, frequency, hematuria and urgency.  Musculoskeletal: Negative for back pain, joint pain and myalgias.  Skin: Negative for rash.  Neurological: Negative for dizziness, tingling, focal weakness, seizures, weakness and headaches.  Endo/Heme/Allergies: Does not bruise/bleed easily.  Psychiatric/Behavioral: Negative for depression and suicidal ideas. The patient does not have insomnia.      No Known Allergies   Past Medical History:  Diagnosis Date  . Allergic rhinitis   . Breast cancer ([place]) 02/2012   left breast lumpectomy, radiation and chemotherapy  . Chickenpox   . Elevated blood pressure   . Fatigue due to depression 06/18/2015  . Personal history of chemotherapy 08/2011   left breast ca  . Personal history of radiation therapy 03/2012-05/2012   left  breast ca  .  UTI (lower urinary tract infection)      Past Surgical History:  Procedure Laterality Date  . ABDOMINAL HYSTERECTOMY    . BREAST BIOPSY Left 07/2011   left breast invasive mammary carcinoma  . BREAST LUMPECTOMY Left 03/16/2012   invasive mammary carcinoma: invasive ductal carcinoma. clear margins after reexcision. 5 LN positive for macrometastasis  . TONSILLECTOMY      Social History   Socioeconomic History  . Marital status: Married    Spouse name: Not on file  . Number of children: Not on file  . Years of education: Not on file  . Highest education level: Not on file  Social Needs  . Financial resource strain: Not on file  . Food insecurity - worry: Not on file  . Food insecurity - inability: Not on file  . Transportation needs - medical: Not on file  . Transportation needs - non-medical: Not on file  Occupational History  . Not on file  Tobacco Use  . Smoking status: Former Research scientist (life sciences)  . Smokeless tobacco: Never Used  Substance and Sexual Activity  . Alcohol use: No    Alcohol/week: 0.0 oz  . Drug use: No  . Sexual activity: Yes  Other Topics Concern  . Not on file  Social History Narrative  . Not on file    Family History  Problem Relation Age of Onset  . Breast cancer Sister 89  . Breast cancer Maternal Grandmother 80  . Alcoholism Unknown        Parent  . Alcohol abuse Father   . Post-traumatic stress disorder Daughter        mental/emotional abuse  . Eating disorder Daughter      Current Outpatient Medications:  .  amLODipine (NORVASC) 5 MG tablet, TAKE 1 TABLET(5 MG) BY MOUTH DAILY, Disp: 90 tablet, Rfl: 0 .  hydrochlorothiazide (HYDRODIURIL) 25 MG tablet, TAKE 1 TABLET(25 MG) BY MOUTH DAILY, Disp: 90 tablet, Rfl: 0 .  lisinopril (PRINIVIL,ZESTRIL) 10 MG tablet, TAKE 1 TABLET(10 MG) BY MOUTH DAILY, Disp: 90 tablet, Rfl: 0 .  tamoxifen (NOLVADEX) 20 MG tablet, TAKE 1 TABLET(20 MG) BY MOUTH DAILY, Disp: 90 tablet, Rfl: 0  Physical exam:  Vitals:    06/01/17 0900  BP: 119/87  Pulse: 92  Temp: (!) 97.2 F (36.2 C)  TempSrc: Tympanic  Weight: 179 lb 12.8 oz (81.6 kg)   Physical Exam  Constitutional: She is oriented to person, place, and time and well-developed, well-nourished, and in no distress.  HENT:  Head: Normocephalic and atraumatic.  Eyes: EOM are normal. Pupils are equal, round, and reactive to light.  Neck: Normal range of motion.  Cardiovascular: Normal rate, regular rhythm and normal heart sounds.  Pulmonary/Chest: Effort normal and breath sounds normal.  Abdominal: Soft. Bowel sounds are normal.  Neurological: She is alert and oriented to person, place, and time.  Skin: Skin is warm and dry.   Breast exam was performed in seated and lying down position. Patient is status post left lumpectomy with a well-healed surgical scar. No evidence of any palpable masses. No evidence of axillary adenopathy. No evidence of any palpable masses or lumps in the right breast. No evidence of right axillary adenopathy   CMP Latest Ref Rng & Units 11/29/2016  Glucose 65 - 99 mg/dL 110(H)  BUN 6 - 20 mg/dL 10  Creatinine 0.44 - 1.00 mg/dL 0.63  Sodium 135 - 145 mmol/L 137  Potassium 3.5 - 5.1 mmol/L 3.6  Chloride 101 -  111 mmol/L 102  CO2 22 - 32 mmol/L 27  Calcium 8.9 - 10.3 mg/dL 9.2  Total Protein 6.5 - 8.1 g/dL 6.7  Total Bilirubin 0.3 - 1.2 mg/dL 0.4  Alkaline Phos 38 - 126 U/L 32(L)  AST 15 - 41 U/L 30  ALT 14 - 54 U/L 24   CBC Latest Ref Rng & Units 06/01/2017  WBC 3.6 - 11.0 K/uL 6.6  Hemoglobin 12.0 - 16.0 g/dL 14.3  Hematocrit 35.0 - 47.0 % 41.5  Platelets 150 - 440 K/uL 202      Assessment and plan- Patient is a 62 y.o. female with a history of left breast cancer stage IIIB status post neoadjuvant chemotherapy followed by lumpectomy and adjuvant radiation  1. Left breast cancer- mammogram from October 2018 revealed no evidence of malignancy.  Clinically she is doing well and there is no evidence of recurrence on  todays exam.  Patient would ideally continue tamoxifen for 10 years given that she had node positive disease requiring chemotherapy.  Given her ongoing symptoms of fatigue and joint pain she would like to take a brief break from tamoxifen.  I have asked her to hold her tamoxifen for about 6-8 weeks time and see if her symptoms improve.  I would strongly want her to consider hormone therapy for 10 years instead of 5 years.  Patient will call us in about 6-8 weeks time and tell us if she wants to continue tamoxifen or stop at that point  2. Osteopenia-patient does have osteopenia which has remained overall stable since 2016. She will continue to take calcium 1200 mg per day along with vitamin D 800 international units.  rtc in 6 months to see md. no labs     Visit Diagnosis 1. Malignant neoplasm of left breast in female, estrogen receptor positive, unspecified site of breast ([place])   2. Encounter for follow-up surveillance of breast cancer      Dr. Randa Evens, MD, MPH [facility] at [facility] Pager- [phone] 06/01/2017 9:30 AM

## 2017-06-02 LAB — CANCER ANTIGEN 27.29: CAN 27.29: 9.4 U/mL (ref 0.0–38.6)

## 2017-06-08 ENCOUNTER — Ambulatory Visit: Payer: Medicare Other | Admitting: Family Medicine

## 2017-07-14 ENCOUNTER — Ambulatory Visit: Payer: Medicare Other | Admitting: Family Medicine

## 2017-08-23 ENCOUNTER — Other Ambulatory Visit: Payer: Self-pay | Admitting: Family Medicine

## 2017-08-23 ENCOUNTER — Telehealth: Payer: Self-pay | Admitting: *Deleted

## 2017-08-23 DIAGNOSIS — C50919 Malignant neoplasm of unspecified site of unspecified female breast: Secondary | ICD-10-CM

## 2017-08-23 NOTE — Telephone Encounter (Signed)
Patient called to report that she has decided to restart her Tamoxifen and she has ordered a refill of her prescription at her pharmacy. She also states that new FMLA papers will be coming for [place] to complete and that while she is on the Tamoxifen, she will not be able to work due to the side effects

## 2017-08-24 ENCOUNTER — Other Ambulatory Visit: Payer: Self-pay | Admitting: *Deleted

## 2017-08-24 DIAGNOSIS — M8580 Other specified disorders of bone density and structure, unspecified site: Secondary | ICD-10-CM

## 2017-08-24 DIAGNOSIS — Z170 Estrogen receptor positive status [ER+]: Secondary | ICD-10-CM

## 2017-08-24 DIAGNOSIS — C50912 Malignant neoplasm of unspecified site of left female breast: Secondary | ICD-10-CM

## 2017-08-24 NOTE — Telephone Encounter (Signed)
Amanda Liu will look into it. I spoke to her. I will not be able to give her disability for tamoxifen side effects 5 years since diagnosis of her breast cancer

## 2017-08-24 NOTE — Telephone Encounter (Signed)
I called pt and told her that we can order the tamoxifen again for her. I asked about the FLMA .  She does not need these papers.  She has been disabled and has medicare benefits.  She is up for renewal in June.  She does not work.  She wanted [place] to know that they will request records to determine if she is still disabled. She states that she has numbness in her feet and when she has feeling it is burning and stinging sensations. The hands resolved with numbness and tingling. She has severe bone pain, flu like sx and fatigue on tamoxifen and that is why she wanted a holiday from the sx and she is feeling much better off of it but wants to start back to prevent the recurrence of her breast cancer.  I have sent new rx in. I told her that when she is up for renewal of disability the SSI will request med. rec and it goes to main med rec. In hospital.  She is to call our office if her side effects are too much to management or to debilitating for her. She is agreeable to call if she needs out help from side effects of tamoxifen

## 2017-08-26 ENCOUNTER — Other Ambulatory Visit: Payer: Self-pay | Admitting: Family Medicine

## 2017-08-26 DIAGNOSIS — C50912 Malignant neoplasm of unspecified site of left female breast: Secondary | ICD-10-CM

## 2017-08-29 ENCOUNTER — Other Ambulatory Visit: Payer: Self-pay | Admitting: Family Medicine

## 2017-08-29 ENCOUNTER — Telehealth: Payer: Self-pay | Admitting: Family Medicine

## 2017-08-29 DIAGNOSIS — C50912 Malignant neoplasm of unspecified site of left female breast: Secondary | ICD-10-CM

## 2017-08-29 MED ORDER — LISINOPRIL 10 MG PO TABS: ORAL_TABLET | ORAL | 0 refills | Status: DC

## 2017-08-29 NOTE — Telephone Encounter (Signed)
Copied from [place] [id number]. Topic: Quick Communication - Rx Refill/Question >> Aug 29, 2017  9:57 AM Yvette Rack wrote: Medication: lisinopril (PRINIVIL,ZESTRIL) 10 MG tablet Has the patient contacted their pharmacy? Yes.  The pharmacy sent a request last Thursday     Pt is completely out of this medicine (Agent: If no, request that the patient contact the pharmacy for the refill.) Preferred Pharmacy (with phone number or street name):   [id number] - [place], [place] [phone] (Phone) [phone] (Fax)     Agent: Please be advised that RX refills may take up to 3 business days. We ask that you follow-up with your pharmacy.

## 2017-09-05 ENCOUNTER — Telehealth: Payer: Self-pay | Admitting: *Deleted

## 2017-09-05 NOTE — Telephone Encounter (Signed)
She can hold tamoxifen at this time and see how she feels. If symptoms resolve, she can restart tamoxifen in about 3 weeks time at a lower dose 10 mg per day. If she still has symptoms she can take it every other day. If she keeps having symptoms at every other day dosing, she will need to stop it

## 2017-09-05 NOTE — Telephone Encounter (Signed)
Advised patient to stop Tamoxifen for 3 weeks and that if her symptoms resolve, we will restart her dose at 10 mg . She repeated this back to me and kept apologizing for having problems and I told her not to apologize, people have problems with medicines all the time. She also reported that her husband reminded her that she has been " confused/ spacey" at times She states she will sold the Tamoxifen for now and call me back on 4/30

## 2017-09-05 NOTE — Telephone Encounter (Signed)
Patient has restarted Tamoxifen 5 days ago and she is having bone pain in knees and thighs inside legs at knees is numb and tingly. She feels like she has the flu. Exhausted. Asking what can be done Please advise

## 2017-09-27 ENCOUNTER — Telehealth: Payer: Self-pay | Admitting: *Deleted

## 2017-09-27 NOTE — Telephone Encounter (Signed)
Patient called reporting that it has been 3 weeks since stopping her Tamoxifen and that she is not confused and her breathing is better. She also reports a decrease in the bone pain she was having, Asking what she is to do now. Please advise

## 2017-09-27 NOTE — Telephone Encounter (Signed)
Called patients number and left message that Dr. Janese Banks is off today and I will ask her about it in am and call her back. The last message that I see was to stop med for 3 weeks and then start tamoxifen 10 mg daily.  I will call her tom. And let her know.

## 2017-09-28 MED ORDER — TAMOXIFEN CITRATE 10 MG PO TABS: 10.0000 mg | ORAL_TABLET | Freq: Every day | ORAL | 1 refills | Status: DC

## 2017-09-28 NOTE — Telephone Encounter (Signed)
Patient informed to restart Tamoxifen at 10 mg / day and if she does not tolerate that to do 10 mg qod. Patient in agreement with this plan and states she still has some pills left from er last prescription and will cut them in half. She will start it tomorrow morning

## 2017-09-28 NOTE — Telephone Encounter (Signed)
What dose of tamoxifen was she taking right before she stopped?

## 2017-09-28 NOTE — Telephone Encounter (Signed)
She can try taking 10 mg daily and if that is also not tolerated 10 mg every other day. She had stage III breast cancer and will derive benefit from it if she can take it. She will need new prescription for 10 mg

## 2017-09-28 NOTE — Telephone Encounter (Signed)
She was on 20 mg per last prescription filled in Dec 2018

## 2017-10-09 ENCOUNTER — Ambulatory Visit: Payer: Medicare Other | Admitting: Family Medicine

## 2017-10-30 ENCOUNTER — Ambulatory Visit (INDEPENDENT_AMBULATORY_CARE_PROVIDER_SITE_OTHER): Payer: Medicare Other | Admitting: Family Medicine

## 2017-10-30 ENCOUNTER — Encounter: Payer: Self-pay | Admitting: Family Medicine

## 2017-10-30 VITALS — BP 132/92 | HR 87 | Temp 97.6°F | Ht 66.0 in | Wt 187.0 lb

## 2017-10-30 DIAGNOSIS — T451X5A Adverse effect of antineoplastic and immunosuppressive drugs, initial encounter: Secondary | ICD-10-CM

## 2017-10-30 DIAGNOSIS — I10 Essential (primary) hypertension: Secondary | ICD-10-CM | POA: Diagnosis not present

## 2017-10-30 DIAGNOSIS — R232 Flushing: Secondary | ICD-10-CM

## 2017-10-30 DIAGNOSIS — Z853 Personal history of malignant neoplasm of breast: Secondary | ICD-10-CM

## 2017-10-30 DIAGNOSIS — G620 Drug-induced polyneuropathy: Secondary | ICD-10-CM | POA: Diagnosis not present

## 2017-10-30 DIAGNOSIS — F419 Anxiety disorder, unspecified: Secondary | ICD-10-CM

## 2017-10-30 DIAGNOSIS — Z1211 Encounter for screening for malignant neoplasm of colon: Secondary | ICD-10-CM

## 2017-10-30 DIAGNOSIS — G5603 Carpal tunnel syndrome, bilateral upper limbs: Secondary | ICD-10-CM | POA: Diagnosis not present

## 2017-10-30 DIAGNOSIS — Z1212 Encounter for screening for malignant neoplasm of rectum: Secondary | ICD-10-CM

## 2017-10-30 LAB — COMPREHENSIVE METABOLIC PANEL
ALT: 18 U/L (ref 0–35)
AST: 22 U/L (ref 0–37)
Albumin: 4.5 g/dL (ref 3.5–5.2)
Alkaline Phosphatase: 42 U/L (ref 39–117)
BILIRUBIN TOTAL: 0.3 mg/dL (ref 0.2–1.2)
BUN: 7 mg/dL (ref 6–23)
CO2: 30 mEq/L (ref 19–32)
CREATININE: 0.61 mg/dL (ref 0.40–1.20)
Calcium: 9.9 mg/dL (ref 8.4–10.5)
Chloride: 99 mEq/L (ref 96–112)
GFR: 105.53 mL/min (ref >60.00)
GLUCOSE: 90 mg/dL (ref 70–99)
Potassium: 3.3 mEq/L — ABNORMAL LOW (ref 3.5–5.1)
SODIUM: 138 meq/L (ref 135–145)
TOTAL PROTEIN: 7 g/dL (ref 6.0–8.3)

## 2017-10-30 LAB — LDL CHOLESTEROL, DIRECT: Direct LDL: 113 mg/dL

## 2017-10-30 LAB — TSH: TSH: 2.34 u[IU]/mL (ref 0.35–4.50)

## 2017-10-30 MED ORDER — WRIST SPLINT/COCK-UP/RIGHT M MISC: 0 refills | Status: DC

## 2017-10-30 MED ORDER — WRIST SPLINT/COCK-UP/LEFT M MISC: 0 refills | Status: DC

## 2017-10-30 MED ORDER — AMLODIPINE BESYLATE 5 MG PO TABS: ORAL_TABLET | ORAL | 1 refills | Status: DC

## 2017-10-30 MED ORDER — HYDROCHLOROTHIAZIDE 25 MG PO TABS: ORAL_TABLET | ORAL | 1 refills | Status: DC

## 2017-10-30 MED ORDER — LISINOPRIL 10 MG PO TABS: ORAL_TABLET | ORAL | 1 refills | Status: DC

## 2017-10-30 NOTE — Patient Instructions (Signed)
Nice to see you. I have refilled your blood pressure medication.  We will check lab work today. Please use the splints on your bilateral wrists.  We will get you to see orthopedics for possible carpal tunnel syndrome. Please bring up your concerns regarding her tamoxifen with your oncologist. If you would like something for anxiety please let us know.

## 2017-10-31 ENCOUNTER — Telehealth: Payer: Self-pay

## 2017-10-31 DIAGNOSIS — F419 Anxiety disorder, unspecified: Secondary | ICD-10-CM | POA: Insufficient documentation

## 2017-10-31 DIAGNOSIS — E876 Hypokalemia: Secondary | ICD-10-CM

## 2017-10-31 DIAGNOSIS — E7800 Pure hypercholesterolemia, unspecified: Secondary | ICD-10-CM

## 2017-10-31 DIAGNOSIS — G5603 Carpal tunnel syndrome, bilateral upper limbs: Secondary | ICD-10-CM | POA: Insufficient documentation

## 2017-10-31 NOTE — Assessment & Plan Note (Signed)
The above goal though she has not taken her lisinopril.  Discussed monitoring at home.  Refill of blood pressure medications given.  Labs as outlined below.

## 2017-10-31 NOTE — Telephone Encounter (Signed)
-----   Message from Leone Haven, MD sent at 10/30/2017  5:45 PM EDT ----- Please let the patient know that her potassium is minimally low.  She needs to work on increasing potassium rich foods in her diet and have this rechecked in 2 weeks.  Please place an order for a potassium with a diagnosis of hypokalemia.  Her cholesterol is slightly elevated.  I would like to have her come back for a fasting total lipid panel as we have not done that in some time.  Please place an order for a lipid panel to be done for elevated LDL.  Once that is completed we can determine if she needs medication for her cholesterol.  Her other lab work is acceptable.  Thanks.

## 2017-10-31 NOTE — Assessment & Plan Note (Signed)
She appears to have some side effects from tamoxifen.  She will continue to see oncology at this time and she will discuss this medication and potential side effects with them.

## 2017-10-31 NOTE — Assessment & Plan Note (Signed)
Patient with some generalized anxiety.  I offered medication or therapy though she deferred.  She will let us know if she changes her mind.

## 2017-10-31 NOTE — Progress Notes (Signed)
Tommi Rumps, MD Phone: [phone]  Amanda Liu is a 62 y.o. female who presents today for f/u.  CC: HTN, neuropathy  HYPERTENSION  Disease Monitoring  Home BP Monitoring not checking Chest pain- no    Dyspnea- no Medications  Compliance-  Taking amlodipine, HCTZ, lisinopril, though she ran out of lisinopril and has not taken in several days.  Edema- no  Patient notes bilateral hands tingle.  Also has foot tingling.  Occasionally her hands will go numb at night.  She will change positions and shake them out and after 10 to 15 minutes they return to normal.  Notes it is in her palm and fingers.  She has chronically not had great sensation in her left upper extremity particularly the medial aspect of her upper arm down into her forearm since she had her breast cancer surgery and lymph node removal.  Breast cancer: She continues to follow with oncology.  She is on tamoxifen and notes bone pain and fatigue related to this.  She reports she has discussed this with oncology and they have cut her dose of the tamoxifen.  She is going to discuss this further with them at her next follow-up visit.  She reports in the past she has applied for disability related to her history of breast cancer and the side effects from the medication.  She states it was reported by her prior physician that she was depressed though she notes no issues with depression and denies that.  She states that they also advised that she had memory loss though she notes no memory loss issues though has thought process issues.  She does have some stress and anxiety and reports having had a single panic attack in the past.  She notes her appetite is not great related to the anxiety.  She notes no early satiety.  No weight loss.  No abdominal pain.  Patient has had issues with hot flashes previously.  She notes more recently she is felt like her insides get hot since she started back on tamoxifen.  She notes these are different  than hot flashes.    Social History   Tobacco Use  Smoking Status Former Smoker  Smokeless Tobacco Never Used     ROS see history of present illness  Objective  Physical Exam Vitals:   10/30/17 1317  BP: (!) 132/92  Pulse: 87  Temp: 97.6 F (36.4 C)  SpO2: 96%    BP Readings from Last 3 Encounters:  10/30/17 (!) 132/92  06/01/17 119/87  05/01/17 110/70   Wt Readings from Last 3 Encounters:  10/30/17 187 lb (84.8 kg)  06/01/17 179 lb 12.8 oz (81.6 kg)  05/01/17 183 lb 12.8 oz (83.4 kg)    Physical Exam  Constitutional: No distress.  Cardiovascular: Normal rate, regular rhythm and normal heart sounds.  Pulmonary/Chest: Effort normal and breath sounds normal.  Musculoskeletal: She exhibits no edema.  Positive Phalen's, positive Tinel's bilateral wrists, no tenderness of the wrists bilaterally  Neurological: She is alert.  CN 2-12 intact, 5/5 strength in bilateral biceps, triceps, grip, quads, hamstrings, plantar and dorsiflexion, slight decreased sensation to light touch left upper extremity that patient reports is chronic, otherwise sensation to light touch intact in bilateral UE and LE, normal gait  Skin: Skin is warm and dry. She is not diaphoretic.     Assessment/Plan: Please see individual problem list.  Essential hypertension The above goal though she has not taken her lisinopril.  Discussed monitoring at home.  Refill of blood pressure medications given.  Labs as outlined below.  Hot flashes Patient's description of feeling hot on the inside could be related to her tamoxifen or hot flashes.  We will check a TSH.  She will discuss this with her oncologist as well.  Neuropathy due to chemotherapeutic drug [facility]) He does have some neuropathy symptoms though her hand symptoms may be carpal tunnel related as well.  Will refer for evaluation of that.  Bilateral carpal tunnel syndrome Her hand symptoms seem consistent with carpal tunnel syndrome based on exam  findings and history.  Will place an cock-up splints at night.  Refer to orthopedic surgery for evaluation and possible nerve conduction studies.  History of breast cancer in female She appears to have some side effects from tamoxifen.  She will continue to see oncology at this time and she will discuss this medication and potential side effects with them.  Anxiety Patient with some generalized anxiety.  I offered medication or therapy though she deferred.  She will let us know if she changes her mind.    Orders Placed This Encounter  Procedures  . Cologuard  . Comp Met (CMET)  . TSH  . LDL cholesterol, direct  . Ambulatory referral to Orthopedic Surgery    Referral Priority:   Routine    Referral Type:   Surgical    Referral Reason:   Specialty Services Required    Requested Specialty:   Orthopedic Surgery    Number of Visits Requested:   1    Meds ordered this encounter  Medications  . lisinopril (PRINIVIL,ZESTRIL) 10 MG tablet    Sig: TAKE 1 TABLET(10 MG) BY MOUTH DAILY    Dispense:  90 tablet    Refill:  1  . hydrochlorothiazide (HYDRODIURIL) 25 MG tablet    Sig: TAKE 1 TABLET(25 MG) BY MOUTH DAILY    Dispense:  90 tablet    Refill:  1  . amLODipine (NORVASC) 5 MG tablet    Sig: TAKE 1 TABLET(5 MG) BY MOUTH DAILY    Dispense:  90 tablet    Refill:  1  . DISCONTD: Elastic Bandages & Supports (WRIST SPLINT/COCK-UP/LEFT M) MISC    Sig: Apply nightly    Dispense:  1 each    Refill:  0  . DISCONTD: Elastic Bandages & Supports (WRIST SPLINT/COCK-UP/RIGHT M) MISC    Sig: Applied nightly    Dispense:  1 each    Refill:  0  . Elastic Bandages & Supports (WRIST SPLINT/COCK-UP/LEFT M) MISC    Sig: Apply nightly    Dispense:  1 each    Refill:  0  . Elastic Bandages & Supports (WRIST SPLINT/COCK-UP/RIGHT M) MISC    Sig: Applied nightly    Dispense:  1 each    Refill:  0     Tommi Rumps, MD [place]

## 2017-10-31 NOTE — Assessment & Plan Note (Signed)
He does have some neuropathy symptoms though her hand symptoms may be carpal tunnel related as well.  Will refer for evaluation of that.

## 2017-10-31 NOTE — Assessment & Plan Note (Signed)
Patient's description of feeling hot on the inside could be related to her tamoxifen or hot flashes.  We will check a TSH.  She will discuss this with her oncologist as well.

## 2017-10-31 NOTE — Assessment & Plan Note (Signed)
Her hand symptoms seem consistent with carpal tunnel syndrome based on exam findings and history.  Will place an cock-up splints at night.  Refer to orthopedic surgery for evaluation and possible nerve conduction studies.

## 2017-11-14 ENCOUNTER — Other Ambulatory Visit (INDEPENDENT_AMBULATORY_CARE_PROVIDER_SITE_OTHER): Payer: Medicare Other

## 2017-11-14 DIAGNOSIS — Z1159 Encounter for screening for other viral diseases: Secondary | ICD-10-CM | POA: Diagnosis not present

## 2017-11-14 DIAGNOSIS — E7800 Pure hypercholesterolemia, unspecified: Secondary | ICD-10-CM

## 2017-11-14 DIAGNOSIS — E876 Hypokalemia: Secondary | ICD-10-CM | POA: Diagnosis not present

## 2017-11-14 LAB — LIPID PANEL
CHOL/HDL RATIO: 3
Cholesterol: 168 mg/dL (ref 0–200)
HDL: 52.5 mg/dL (ref >39.00)
LDL Cholesterol: 96 mg/dL (ref 0–99)
NONHDL: 115.44
Triglycerides: 99 mg/dL (ref 0.0–149.0)
VLDL: 19.8 mg/dL (ref 0.0–40.0)

## 2017-11-14 LAB — POTASSIUM: Potassium: 3.9 mEq/L (ref 3.5–5.1)

## 2017-11-15 LAB — HEPATITIS C ANTIBODY
Hepatitis C Ab: NONREACTIVE
SIGNAL TO CUT-OFF: 0.01 (ref <1.00)

## 2017-12-01 ENCOUNTER — Inpatient Hospital Stay: Payer: Medicare Other | Admitting: Oncology

## 2017-12-12 ENCOUNTER — Inpatient Hospital Stay: Payer: Medicare Other | Admitting: Oncology

## 2017-12-18 ENCOUNTER — Other Ambulatory Visit: Payer: Self-pay | Admitting: Oncology

## 2017-12-18 ENCOUNTER — Other Ambulatory Visit: Payer: Self-pay | Admitting: *Deleted

## 2017-12-18 MED ORDER — TAMOXIFEN CITRATE 10 MG PO TABS: 10.0000 mg | ORAL_TABLET | Freq: Every day | ORAL | 0 refills | Status: DC

## 2018-01-16 ENCOUNTER — Inpatient Hospital Stay: Payer: Medicare Other | Attending: Oncology | Admitting: Oncology

## 2018-01-16 ENCOUNTER — Encounter: Payer: Self-pay | Admitting: Oncology

## 2018-01-16 ENCOUNTER — Encounter (INDEPENDENT_AMBULATORY_CARE_PROVIDER_SITE_OTHER): Payer: Self-pay

## 2018-01-16 VITALS — BP 130/84 | HR 76 | Temp 97.5°F | Resp 18 | Ht 66.0 in | Wt 185.1 lb

## 2018-01-16 DIAGNOSIS — Z170 Estrogen receptor positive status [ER+]: Secondary | ICD-10-CM | POA: Diagnosis not present

## 2018-01-16 DIAGNOSIS — Z87440 Personal history of urinary (tract) infections: Secondary | ICD-10-CM | POA: Insufficient documentation

## 2018-01-16 DIAGNOSIS — Z9012 Acquired absence of left breast and nipple: Secondary | ICD-10-CM | POA: Diagnosis not present

## 2018-01-16 DIAGNOSIS — R5383 Other fatigue: Secondary | ICD-10-CM | POA: Diagnosis not present

## 2018-01-16 DIAGNOSIS — Z923 Personal history of irradiation: Secondary | ICD-10-CM | POA: Insufficient documentation

## 2018-01-16 DIAGNOSIS — Z87891 Personal history of nicotine dependence: Secondary | ICD-10-CM | POA: Diagnosis not present

## 2018-01-16 DIAGNOSIS — R232 Flushing: Secondary | ICD-10-CM | POA: Insufficient documentation

## 2018-01-16 DIAGNOSIS — C50912 Malignant neoplasm of unspecified site of left female breast: Secondary | ICD-10-CM | POA: Diagnosis not present

## 2018-01-16 DIAGNOSIS — T451X5D Adverse effect of antineoplastic and immunosuppressive drugs, subsequent encounter: Secondary | ICD-10-CM | POA: Insufficient documentation

## 2018-01-16 DIAGNOSIS — Z79810 Long term (current) use of selective estrogen receptor modulators (SERMs): Secondary | ICD-10-CM | POA: Diagnosis not present

## 2018-01-16 DIAGNOSIS — M2550 Pain in unspecified joint: Secondary | ICD-10-CM | POA: Insufficient documentation

## 2018-01-16 DIAGNOSIS — Z9221 Personal history of antineoplastic chemotherapy: Secondary | ICD-10-CM | POA: Diagnosis not present

## 2018-01-16 DIAGNOSIS — F329 Major depressive disorder, single episode, unspecified: Secondary | ICD-10-CM | POA: Diagnosis not present

## 2018-01-16 DIAGNOSIS — R918 Other nonspecific abnormal finding of lung field: Secondary | ICD-10-CM | POA: Insufficient documentation

## 2018-01-16 DIAGNOSIS — Z79899 Other long term (current) drug therapy: Secondary | ICD-10-CM | POA: Diagnosis not present

## 2018-01-16 DIAGNOSIS — Z780 Asymptomatic menopausal state: Secondary | ICD-10-CM

## 2018-01-16 DIAGNOSIS — G620 Drug-induced polyneuropathy: Secondary | ICD-10-CM | POA: Insufficient documentation

## 2018-01-16 DIAGNOSIS — T386X5D Adverse effect of antigonadotrophins, antiestrogens, antiandrogens, not elsewhere classified, subsequent encounter: Secondary | ICD-10-CM

## 2018-01-16 NOTE — Progress Notes (Signed)
Hematology/Oncology Consult note [facility]  [phone] [phone]  Patient Care Team: Leone Haven, MD as PCP - General (Family Medicine)   Name of the patient: Amanda Liu  [id number]  [birth year]   Date of visit: 01/16/18  Diagnosis- Left breast cancer Stage IIIB ypT2N2a ER PR positive and HER-2/neu negative  Chief complaint/ Reason for visit-routine follow-up of breast cancer  Heme/Onc history: The patient presented in 2013 with a palpable/visible mass. Mammogram on 07/19/2011 revealed a 3 x 2.6 cm irregular mass in the superior left breast at the 12:00 position. There were microcalcifications associated with the mass.   PET scan on 08/09/2011 revealed a hypermetabolic mass in the left breast consistent with patient's knownmalignancy. There was mild hypermetabolic activity in the left axillary lymph nodes. Therewas no evidence of metastatic disease. There were indeterminate pulmonary nodules 5 mm or less below the resolution of PET.  Left breast biopsy on 08/16/2011 revealed invasive mammary carcinoma. Estrogen receptor was positive (>90%) progesterone receptor positive (>90%) and HER-2/neu negative.  She received neoadjuvant chemotherapy consisting of AC x 4 every 3 weeks (08/31/2011 - 11/02/2011) and weekly Taxol x 12 (11/23/2011 - 02/08/2012). She tolerated her chemotherapy well. 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  She underwent left partial mastectomy and sentinel lymph node biopsy by Dr. Ninetta Lights 03/16/2012. 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 Tumor invaded the dermis without skin ulceration. 5 of 11 lymph nodes were positive for macro metastasis. Pathologic stage was ypT2N2a (stage IIIB).  She received radiation from 03/2012 - 05/2012. She was on Armidex for 2 1/2 years beginning 05/2012. She developed bone pain and fatigue.  She states that she was taken off Arimidex for30 days beginning 12/08/2014. She was prescribed Aromasin, but did not take as it costtoo much. Tamoxifen was started in 12/2014.    Interval history-currently patient is taking tamoxifen 10 mg daily as she was not able to tolerate 20 mg dose.  Even with the 10 mg dose patient reports on and off hot flashes and significant fatigue.  Also reports diffuse joint pains.  In addition to that patient also has some chemo-induced neuropathy.  She reports on and off pain burning or tingling numbness especially in the soles of her feet which at times makes it difficult for her to walk.  ECOG PS- 1 Pain scale- 0 Opioid associated constipation- no  Review of systems- Review of Systems  Constitutional: Positive for malaise/fatigue. Negative for chills, fever and weight loss.  HENT: Negative for congestion, ear discharge and nosebleeds.   Eyes: Negative for blurred vision.  Respiratory: Negative for cough, hemoptysis, sputum production, shortness of breath and wheezing.   Cardiovascular: Negative for chest pain, palpitations, orthopnea and claudication.  Gastrointestinal: Negative for abdominal pain, blood in stool, constipation, diarrhea, heartburn, melena, nausea and vomiting.  Genitourinary: Negative for dysuria, flank pain, frequency, hematuria and urgency.  Musculoskeletal: Positive for joint pain. Negative for back pain and myalgias.  Skin: Negative for rash.  Neurological: Negative for dizziness, tingling, focal weakness, seizures, weakness and headaches.  Endo/Heme/Allergies: Does not bruise/bleed easily.  Psychiatric/Behavioral: Negative for depression and suicidal ideas. The patient does not have insomnia.       No Known Allergies   Past Medical History:  Diagnosis Date  . Allergic rhinitis   . Breast cancer ([place]) 02/2012   left breast lumpectomy, radiation and chemotherapy  . Chickenpox   . Elevated blood pressure   .  Fatigue due to  depression 06/18/2015  . Personal history of chemotherapy 08/2011   left breast ca  . Personal history of radiation therapy 03/2012-05/2012   left breast ca  . UTI (lower urinary tract infection)      Past Surgical History:  Procedure Laterality Date  . ABDOMINAL HYSTERECTOMY    . BREAST BIOPSY Left 07/2011   left breast invasive mammary carcinoma  . BREAST LUMPECTOMY Left 03/16/2012   invasive mammary carcinoma: invasive ductal carcinoma. clear margins after reexcision. 5 LN positive for macrometastasis  . TONSILLECTOMY      Social History   Socioeconomic History  . Marital status: Married    Spouse name: Not on file  . Number of children: Not on file  . Years of education: Not on file  . Highest education level: Not on file  Occupational History  . Not on file  Social Needs  . Financial resource strain: Not on file  . Food insecurity:    Worry: Not on file    Inability: Not on file  . Transportation needs:    Medical: Not on file    Non-medical: Not on file  Tobacco Use  . Smoking status: Former Research scientist (life sciences)  . Smokeless tobacco: Never Used  Substance and Sexual Activity  . Alcohol use: No    Alcohol/week: 0.0 standard drinks  . Drug use: No  . Sexual activity: Yes  Lifestyle  . Physical activity:    Days per week: Not on file    Minutes per session: Not on file  . Stress: Not on file  Relationships  . Social connections:    Talks on phone: Not on file    Gets together: Not on file    Attends religious service: Not on file    Active member of club or organization: Not on file    Attends meetings of clubs or organizations: Not on file    Relationship status: Not on file  . Intimate partner violence:    Fear of current or ex partner: Not on file    Emotionally abused: Not on file    Physically abused: Not on file    Forced sexual activity: Not on file  Other Topics Concern  . Not on file  Social History Narrative  . Not on file    Family History  Problem  Relation Age of Onset  . Breast cancer Sister 39  . Breast cancer Maternal Grandmother 80  . Alcoholism Unknown        Parent  . Alcohol abuse Father   . Post-traumatic stress disorder Daughter        mental/emotional abuse  . Eating disorder Daughter      Current Outpatient Medications:  .  amLODipine (NORVASC) 5 MG tablet, TAKE 1 TABLET(5 MG) BY MOUTH DAILY, Disp: 90 tablet, Rfl: 1 .  hydrochlorothiazide (HYDRODIURIL) 25 MG tablet, TAKE 1 TABLET(25 MG) BY MOUTH DAILY, Disp: 90 tablet, Rfl: 1 .  lisinopril (PRINIVIL,ZESTRIL) 10 MG tablet, TAKE 1 TABLET(10 MG) BY MOUTH DAILY, Disp: 90 tablet, Rfl: 1 .  tamoxifen (NOLVADEX) 10 MG tablet, Take 1 tablet (10 mg total) by mouth daily., Disp: 30 tablet, Rfl: 0 .  Elastic Bandages & Supports (WRIST SPLINT/COCK-UP/LEFT M) MISC, Apply nightly (Patient not taking: Reported on 01/16/2018), Disp: 1 each, Rfl: 0 .  Elastic Bandages & Supports (WRIST SPLINT/COCK-UP/RIGHT M) MISC, Applied nightly (Patient not taking: Reported on 01/16/2018), Disp: 1 each, Rfl: 0  Physical exam:  Vitals:   01/16/18 5366  BP: 130/84  Pulse: 76  Resp: 18  Temp: (!) 97.5 F (36.4 C)  TempSrc: Tympanic  SpO2: 97%  Weight: 185 lb 1.6 oz (84 kg)  Height: '5\' 6"'  (1.676 m)   Physical Exam  Constitutional: She is oriented to person, place, and time. She appears well-developed and well-nourished.  HENT:  Head: Normocephalic and atraumatic.  Eyes: Pupils are equal, round, and reactive to light. EOM are normal.  Neck: Normal range of motion.  Cardiovascular: Normal rate, regular rhythm and normal heart sounds.  Pulmonary/Chest: Effort normal and breath sounds normal.  Abdominal: Soft. Bowel sounds are normal.  Neurological: She is alert and oriented to person, place, and time.  Skin: Skin is warm and dry.   Breast exam was performed in seated and lying down position. Patient is status post left lumpectomy with a well-healed surgical scar. No evidence of any palpable  masses. No evidence of axillary adenopathy. No evidence of any palpable masses or lumps in the right breast. No evidence of right axillary adenopathy   CMP Latest Ref Rng & Units 11/14/2017  Glucose 70 - 99 mg/dL -  BUN 6 - 23 mg/dL -  Creatinine 0.40 - 1.20 mg/dL -  Sodium 135 - 145 mEq/L -  Potassium 3.5 - 5.1 mEq/L 3.9  Chloride 96 - 112 mEq/L -  CO2 19 - 32 mEq/L -  Calcium 8.4 - 10.5 mg/dL -  Total Protein 6.0 - 8.3 g/dL -  Total Bilirubin 0.2 - 1.2 mg/dL -  Alkaline Phos 39 - 117 U/L -  AST 0 - 37 U/L -  ALT 0 - 35 U/L -   CBC Latest Ref Rng & Units 06/01/2017  WBC 3.6 - 11.0 K/uL 6.6  Hemoglobin 12.0 - 16.0 g/dL 14.3  Hematocrit 35.0 - 47.0 % 41.5  Platelets 150 - 440 K/uL 202     Assessment and plan- Patient is a 62 y.o. female  with a history of left breast cancer stage IIIB status post neoadjuvant chemotherapy followed by lumpectomy and adjuvant radiation  Patient has 4 more years of hormone therapy to go to complete 10 years of adjuvant hormone therapy.  Typically with high risk breast cancer warranting adjuvant chemotherapy I would recommend hormone therapy for 10 years.  However patient has been having significant problems with hormone therapy and currently she is on half the dose of tamoxifen at 10 mg daily.  She still reports hot flashes fatigue and diffuse joint pains.  This is affecting her quality of life.  I discussed that at this point 1 of the options could be to do the breast cancer index testing to see if she would derive any additional benefit with extended hormone therapy for 10 years.  If the test does not show any significant benefit we could consider stopping the tamoxifen at this time given her ongoing side effects.  If the test does show additional benefit in continuing hormone therapy, patient would like to try the existing dose and try to continue with for another 4 years.  We will be checking the BCI test on her tumor specimen and I will give her a call once  the results of the tests are back.  Clinically otherwise patient is doing well and there is no evidence of recurrence on today's exam.  She is due for a screening mammogram in October 2019 which we will order.  Since she is more than 5 years out of her breast cancer I will continue to see her yearly  at this time.  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 >50% of the time was  spent in counseling and coordination of care.     Visit Diagnosis 1. Malignant neoplasm of left breast in female, estrogen receptor positive, unspecified site of breast ([place])   2. Post-menopausal   3. Adverse effect of tamoxifen, subsequent encounter      Dr. Randa Evens, MD, MPH [facility] at [facility] [phone] 01/16/2018 1:14 PM

## 2018-01-16 NOTE — Progress Notes (Signed)
Patient c/o the tamoxifen is making her bone achy with tingling and numbness to her legs

## 2018-01-22 ENCOUNTER — Telehealth: Payer: Self-pay | Admitting: *Deleted

## 2018-01-22 NOTE — Telephone Encounter (Signed)
Called pt and got her voicemail and left her a message that we did send in request for BCI testing however because pt had 5 positive lymph nodes she does not fit the criteria for test so it can't be done. Dr. Janese Banks recommends that she stay on tamoxifen for 10 years. I have asked that she call me with any questions or concerns about this. Left my direct number

## 2018-01-28 ENCOUNTER — Other Ambulatory Visit: Payer: Self-pay | Admitting: Oncology

## 2018-01-31 IMAGING — MG MM DIGITAL DIAGNOSTIC BILAT W/ TOMO W/ CAD
8 of 14 series · 8 of 30 positions shown · non-contrast
Comparison: Previous exam(s).

CLINICAL DATA: History of left lumpectomy in 6561.

EXAM:
2D DIGITAL DIAGNOSTIC BILATERAL MAMMOGRAM WITH CAD AND ADJUNCT TOMO

[R MLO (1 of 2)]
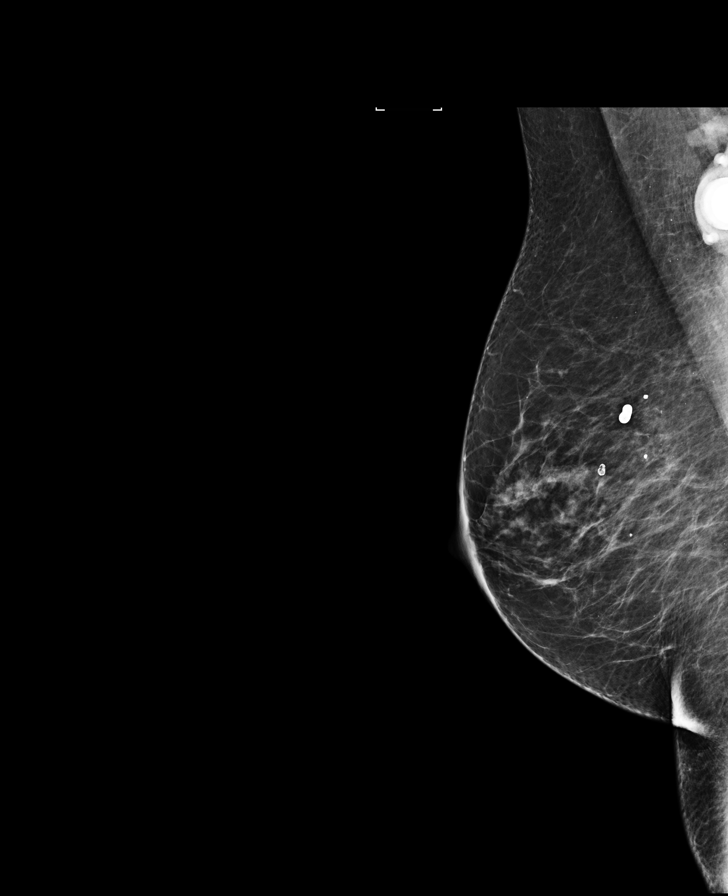

[L MLO (1 of 2)]
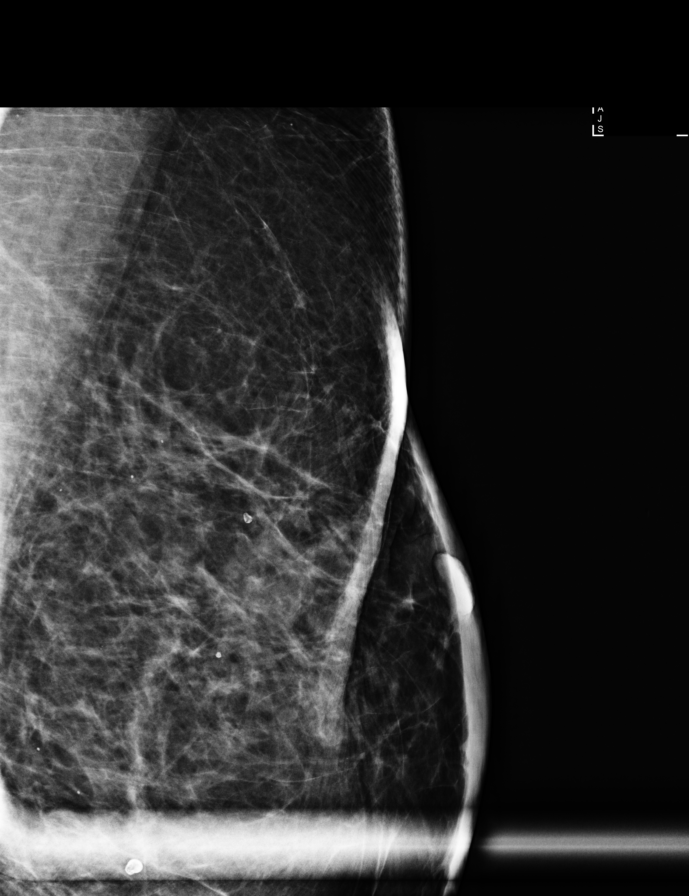

[R CC synth-2D]
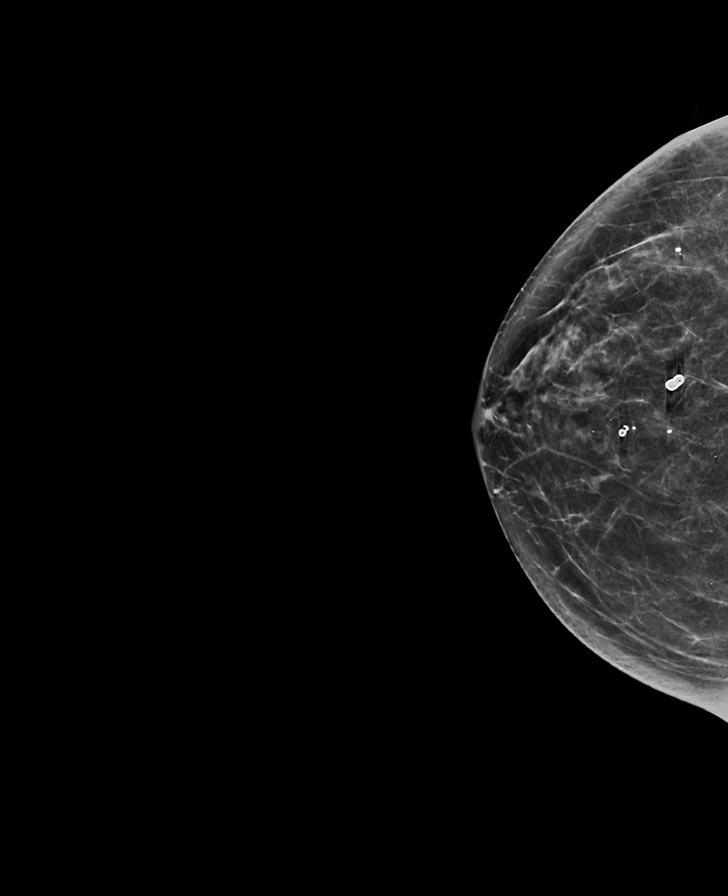

[L MLO (2 of 2)]
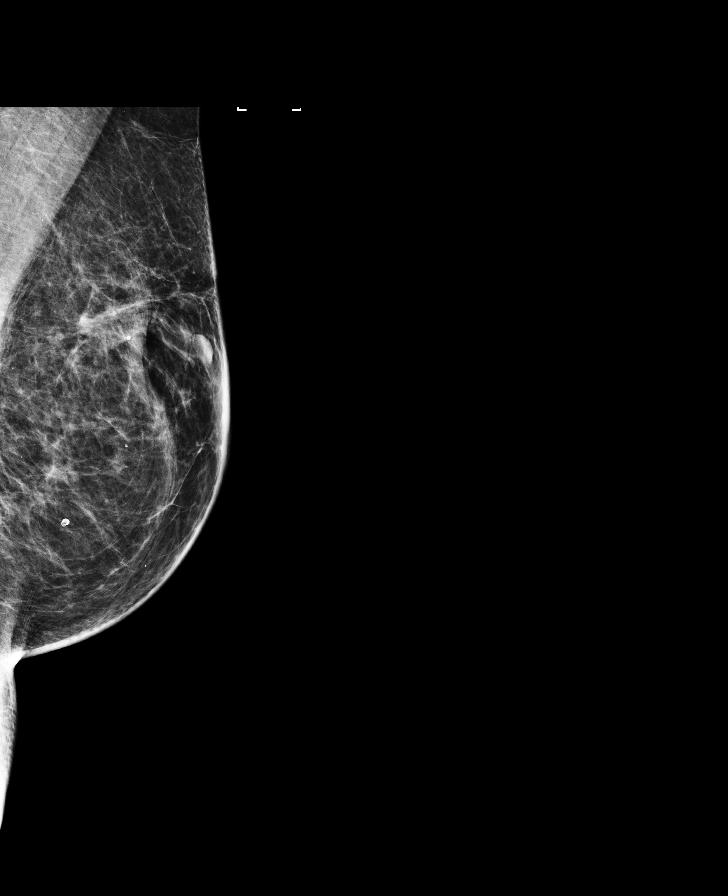

[R CC]
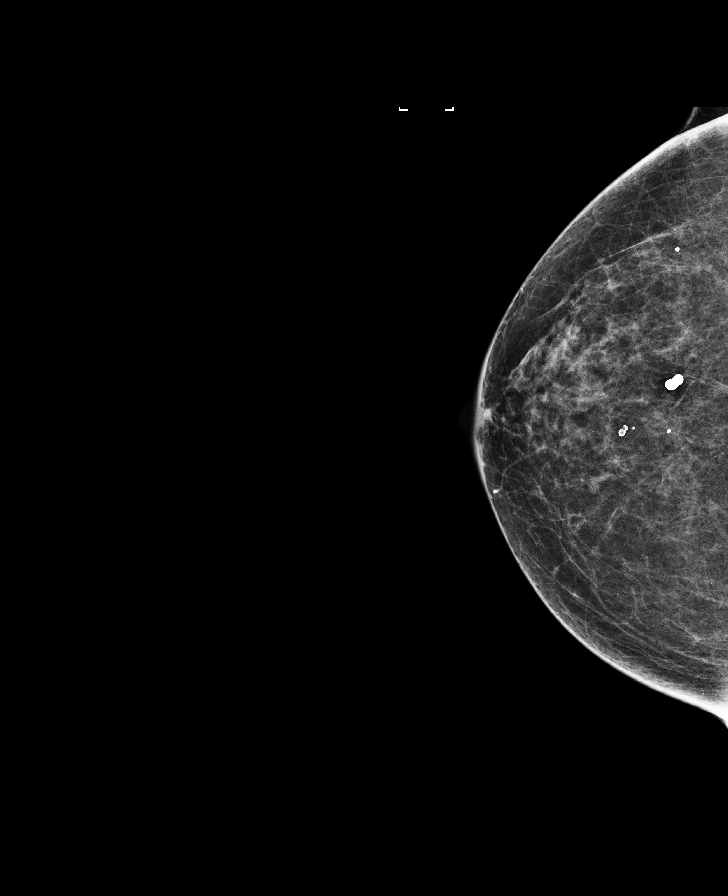

[L CC]
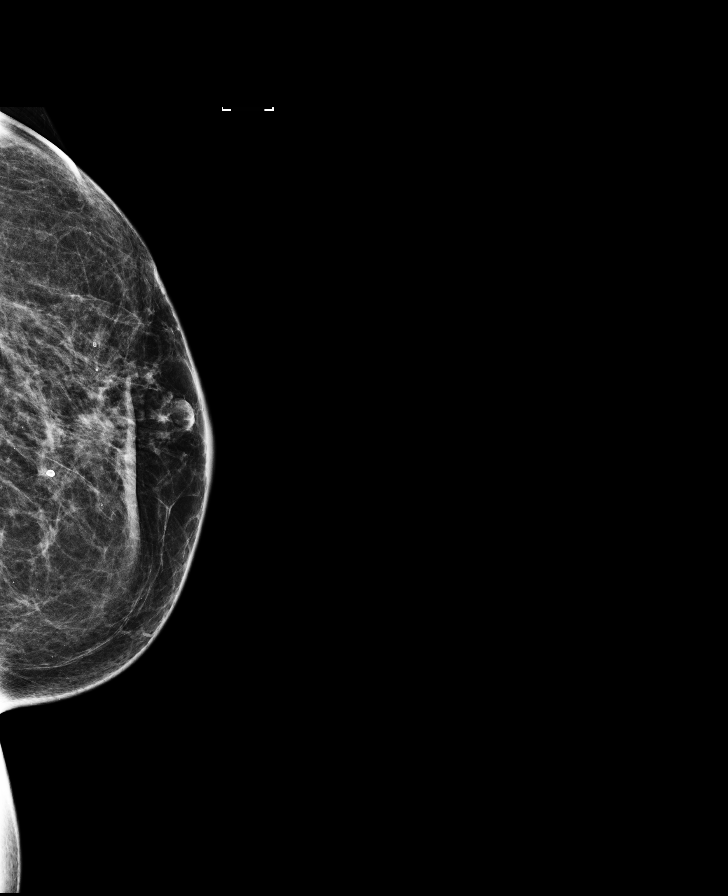

[R MLO (2 of 2)]
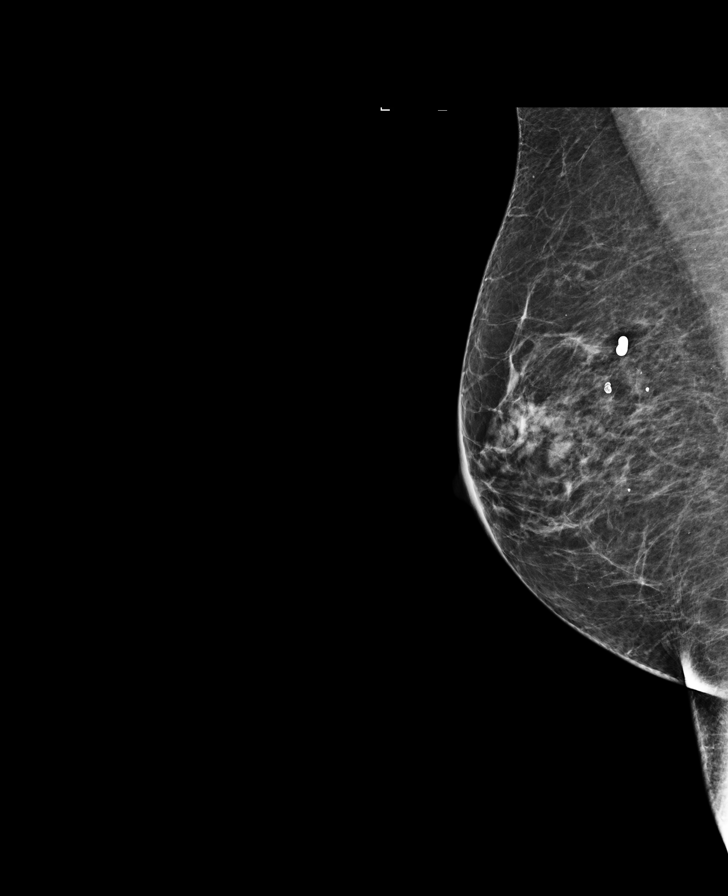

[R MLO synth-2D]
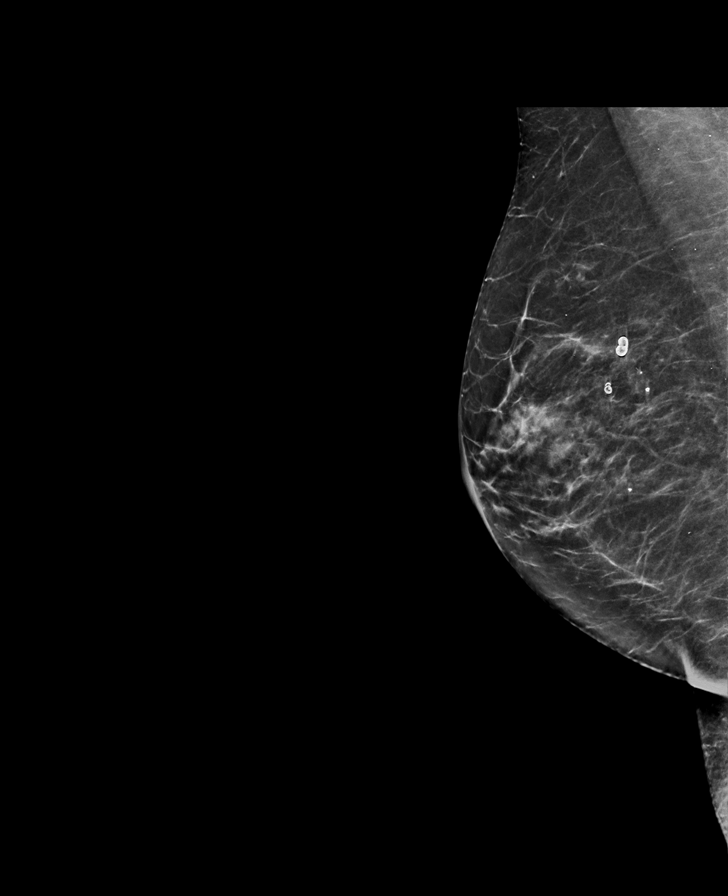

[8 of 30 positions shown; findings below may reference images not displayed]

ACR Breast Density Category b: There are scattered areas of
fibroglandular density.
FINDINGS: Postlumpectomy changes are again noted of the left breast. No
suspicious mass, calcifications, or other abnormality is identified
within either breast.

Mammographic images were processed with CAD.
IMPRESSION: No mammographic evidence of malignancy.

RECOMMENDATION:
Bilateral diagnostic mammogram in 1 year.

I have discussed the findings and recommendations with the patient.
Results were also provided in writing at the conclusion of the
visit. If applicable, a reminder letter will be sent to the patient
regarding the next appointment.

BI-RADS CATEGORY  2: Benign.

## 2018-02-20 ENCOUNTER — Encounter: Payer: Self-pay | Admitting: Family Medicine

## 2018-02-20 ENCOUNTER — Ambulatory Visit (INDEPENDENT_AMBULATORY_CARE_PROVIDER_SITE_OTHER): Payer: Medicare Other | Admitting: Family Medicine

## 2018-02-20 VITALS — BP 120/82 | HR 71 | Temp 98.2°F | Ht 66.0 in | Wt 185.9 lb

## 2018-02-20 DIAGNOSIS — F419 Anxiety disorder, unspecified: Secondary | ICD-10-CM | POA: Diagnosis not present

## 2018-02-20 DIAGNOSIS — R0781 Pleurodynia: Secondary | ICD-10-CM | POA: Diagnosis not present

## 2018-02-20 DIAGNOSIS — Z23 Encounter for immunization: Secondary | ICD-10-CM

## 2018-02-20 DIAGNOSIS — I10 Essential (primary) hypertension: Secondary | ICD-10-CM

## 2018-02-20 DIAGNOSIS — Z853 Personal history of malignant neoplasm of breast: Secondary | ICD-10-CM

## 2018-02-20 DIAGNOSIS — M898X9 Other specified disorders of bone, unspecified site: Secondary | ICD-10-CM | POA: Diagnosis not present

## 2018-02-20 DIAGNOSIS — R0789 Other chest pain: Secondary | ICD-10-CM

## 2018-02-20 MED ORDER — MIRTAZAPINE 15 MG PO TABS: 15.0000 mg | ORAL_TABLET | Freq: Every day | ORAL | 2 refills | Status: DC

## 2018-02-20 NOTE — Patient Instructions (Signed)
Nice to see you. We will have you return in 3 months for follow-up of your anxiety.  If it worsens please let us know.  We will trial you on Remeron to see if that is beneficial.  You will take this at bedtime.  Please try to not watch TV or get on your phone the hour prior to bed or while you are in bed.

## 2018-02-20 NOTE — Assessment & Plan Note (Signed)
She will continue to see oncology for this.

## 2018-02-20 NOTE — Assessment & Plan Note (Signed)
Well-controlled.  Continue current regimen. 

## 2018-02-20 NOTE — Assessment & Plan Note (Signed)
Continues to have issues with this.  I suspect this is affecting her sleep.  Will trial Remeron for anxiety and for sleep benefit.  She will follow-up in 3 months.

## 2018-02-20 NOTE — Assessment & Plan Note (Signed)
She continues to have issues with this related to her tamoxifen.  She will continue to follow with her oncologist.

## 2018-02-20 NOTE — Progress Notes (Signed)
Tommi Rumps, MD Phone: [phone]  Amanda Liu is a 62 y.o. female who presents today for f/u.  CC: htn, anxiety, breast cancer  HYPERTENSION  Disease Monitoring  Home BP Monitoring not checking Chest pain- no    Dyspnea- no Medications  Compliance-  Taking amlodipine, HCTZ, lisinopril.  Edema- no  Anxiety: Patient notes she starts to think about things that she can accomplish later in the day.  She has trouble with bone pain related to her medication for her breast cancer and that keeps her from doing things.  She will not fall asleep until 3:30 in the morning at times.  She tries to go to bed at 10:30 PM.  She will watch TV the hour before bed.  She will occasionally get on her phone in the middle the night.  No caffeine or alcohol intake.  No depression.  Breast cancer: Patient continues to take tamoxifen.  She does have bony pain from this.  They want her to take it for 10 years.  She takes extra strength ibuprofen at times for her bone pain.  She continues to have the symptoms despite being on a lesser dose.  Her most recent CA 27.29 was normal.  Rib fracture: Patient's prior rib pain from her rib fractures 2 years ago has resolved and not recurred.  She was previously referred to general surgery though she did not see them.  She had an injury that resulted in these rib fractures.  Social History   Tobacco Use  Smoking Status Former Smoker  Smokeless Tobacco Never Used     ROS see history of present illness  Objective  Physical Exam Vitals:   02/20/18 0851  BP: 120/82  Pulse: 71  Temp: 98.2 F (36.8 C)  SpO2: 96%    BP Readings from Last 3 Encounters:  02/20/18 120/82  01/16/18 130/84  10/30/17 (!) 132/92   Wt Readings from Last 3 Encounters:  02/20/18 185 lb 14.4 oz (84.3 kg)  01/16/18 185 lb 1.6 oz (84 kg)  10/30/17 187 lb (84.8 kg)    Physical Exam  Constitutional: No distress.  Cardiovascular: Normal rate, regular rhythm and normal heart  sounds.  Pulmonary/Chest: Effort normal and breath sounds normal.  Musculoskeletal: She exhibits no edema.  No left-sided rib tenderness, no bony defects noted  Neurological: She is alert.  Skin: Skin is warm and dry. She is not diaphoretic.     Assessment/Plan: Please see individual problem list.  Essential hypertension Well-controlled.  Continue current regimen.  Anxiety Continues to have issues with this.  I suspect this is affecting her sleep.  Will trial Remeron for anxiety and for sleep benefit.  She will follow-up in 3 months.  Bone pain She continues to have issues with this related to her tamoxifen.  She will continue to follow with her oncologist.  History of breast cancer in female She will continue to see oncology for this.  Rib pain on left side Related to rib fractures from an injury.  Symptoms have resolved.  Health maintenance: Patient will complete Cologuard.  Orders Placed This Encounter  Procedures  . Flu Vaccine QUAD 6+ mos PF IM (Fluarix Quad PF)    Meds ordered this encounter  Medications  . mirtazapine (REMERON) 15 MG tablet    Sig: Take 1 tablet (15 mg total) by mouth at bedtime.    Dispense:  30 tablet    Refill:  2     Tommi Rumps, MD [place]

## 2018-02-20 NOTE — Assessment & Plan Note (Signed)
Related to rib fractures from an injury.  Symptoms have resolved.

## 2018-02-28 ENCOUNTER — Telehealth: Payer: Self-pay | Admitting: Family Medicine

## 2018-02-28 DIAGNOSIS — R221 Localized swelling, mass and lump, neck: Secondary | ICD-10-CM | POA: Diagnosis not present

## 2018-02-28 DIAGNOSIS — M7989 Other specified soft tissue disorders: Secondary | ICD-10-CM | POA: Diagnosis not present

## 2018-02-28 NOTE — Telephone Encounter (Signed)
Sent to PCP what would you like to suggest or advise the patient to do? Please let me know if you need more information I am happy to call pt to gain more information.

## 2018-02-28 NOTE — Telephone Encounter (Signed)
Patient returned call

## 2018-02-28 NOTE — Telephone Encounter (Signed)
Pt called back stating that she has just remembered while walking her dog Monday, he saw a chipmunk and darted, pulling her right arm hard. Pt thinks this may be the cause of the pain but is still unsure. Pt states that the pain is starting to subside but still noticeably there.

## 2018-02-28 NOTE — Telephone Encounter (Signed)
Copied from [place] [id number]. Topic: General - Other >> Feb 28, 2018 11:20 AM Keene Breath wrote: Reason for CRM: Patient called to inform the doctor that her right arm is a little swollen and tingling.  Patient states that it feels like the muscles are tight and as though she had just worked out.  This is the same side as the catheter for chemo that she currently has.  The patient did not want to speak with a NT or make an appointment at this time.   She would prefer that a nurse or doctor call her back to see what they think she should do.  Please advise.  CB# [phone].

## 2018-02-28 NOTE — Telephone Encounter (Signed)
Called and spoke with patient.  She reports discomfort in her upper right arm starting yesterday.  Reports some tingling with this and it was puffy this morning.  Somewhat swollen.  Notes the swelling has gone down but she continues to have discomfort.  It is on the same side her port is for her prior chemo.  She notes no blood clot history.  Given her persistent symptoms I discussed having her evaluated at urgent care tonight to ensure that there is no serious cause.  She voiced understanding and will go to the urgent care near her house.

## 2018-02-28 NOTE — Telephone Encounter (Signed)
Please contact patient

## 2018-03-01 ENCOUNTER — Ambulatory Visit (INDEPENDENT_AMBULATORY_CARE_PROVIDER_SITE_OTHER): Payer: Medicare Other | Admitting: Internal Medicine

## 2018-03-01 ENCOUNTER — Encounter: Payer: Self-pay | Admitting: Internal Medicine

## 2018-03-01 ENCOUNTER — Ambulatory Visit
Admission: RE | Admit: 2018-03-01 | Discharge: 2018-03-01 | Disposition: A | Discharge status: Home / Self Care | Payer: Medicare Other | Source: Ambulatory Visit | Attending: Internal Medicine | Admitting: Internal Medicine

## 2018-03-01 VITALS — BP 116/78 | HR 90 | Temp 99.1°F | Resp 15 | Ht 66.0 in | Wt 185.0 lb

## 2018-03-01 DIAGNOSIS — Z853 Personal history of malignant neoplasm of breast: Secondary | ICD-10-CM | POA: Diagnosis not present

## 2018-03-01 DIAGNOSIS — M79621 Pain in right upper arm: Secondary | ICD-10-CM

## 2018-03-01 DIAGNOSIS — M7989 Other specified soft tissue disorders: Secondary | ICD-10-CM | POA: Diagnosis not present

## 2018-03-01 DIAGNOSIS — G4700 Insomnia, unspecified: Secondary | ICD-10-CM | POA: Diagnosis not present

## 2018-03-01 NOTE — Progress Notes (Signed)
Subjective:  Patient ID: Amanda Liu, female    DOB: 26-Dec-1955  Age: 62 y.o. MRN: [id number]  CC: The primary encounter diagnosis was Swelling in right armpit. Diagnoses of Pain in right upper arm and Insomnia, unspecified type were also pertinent to this visit.  HPI Amanda Liu presents for multiple issues  1)  swelling in her right upper arm that started on Tuesday after walking her dog and having her right arm jerked very hard when he bolted after a squirrel.  She has had pain and swelling in the posterior and anterior regions of the shoulder that radiates to her neck since Tuesday .  The swelling has been improving  and is not as prevalent today .  She was advised to go to Urgent care last night by PCP,  And Urgent Care advised her to go to the ER last night for an ultrasound ,  But she did not go.     She has a history of left sided breast cancer,  And as a Right IJ Port-A-Cath on the right upper chest.  Last access of porta cath is unknown.     2) Insomnia;  Did not tolerate remeron due to persistent sedation . Took it for 5 days.  medicatoin did not kick in until the following day , sleeping better now without any medication     Outpatient Medications Prior to Visit  Medication Sig Dispense Refill  . amLODipine (NORVASC) 5 MG tablet TAKE 1 TABLET(5 MG) BY MOUTH DAILY 90 tablet 1  . Elastic Bandages & Supports (WRIST SPLINT/COCK-UP/LEFT M) MISC Apply nightly 1 each 0  . Elastic Bandages & Supports (WRIST SPLINT/COCK-UP/RIGHT M) MISC Applied nightly 1 each 0  . hydrochlorothiazide (HYDRODIURIL) 25 MG tablet TAKE 1 TABLET(25 MG) BY MOUTH DAILY 90 tablet 1  . lisinopril (PRINIVIL,ZESTRIL) 10 MG tablet TAKE 1 TABLET(10 MG) BY MOUTH DAILY 90 tablet 1  . tamoxifen (NOLVADEX) 10 MG tablet TAKE 1 TABLET(10 MG) BY MOUTH DAILY 30 tablet 0  . mirtazapine (REMERON) 15 MG tablet Take 1 tablet (15 mg total) by mouth at bedtime. (Patient not taking: Reported on 03/01/2018) 30 tablet 2   No  facility-administered medications prior to visit.     Review of Systems;  Patient denies headache, fevers, malaise, unintentional weight loss, skin rash, eye pain, sinus congestion and sinus pain, sore throat, dysphagia,  hemoptysis , cough, dyspnea, wheezing, chest pain, palpitations, orthopnea, edema, abdominal pain, nausea, melena, diarrhea, constipation, flank pain, dysuria, hematuria, urinary  Frequency, nocturia, numbness, tingling, seizures,  Focal weakness, Loss of consciousness,  Tremor, insomnia, depression, anxiety, and suicidal ideation.      Objective:  BP 116/78 (BP Location: Right Arm, Patient Position: Sitting, Cuff Size: Normal)   Pulse 90   Temp 99.1 F (37.3 C) (Oral)   Resp 15   Ht 5\' 6"  (1.676 m)   Wt 185 lb (83.9 kg)   SpO2 98%   BMI 29.86 kg/m   BP Readings from Last 3 Encounters:  03/01/18 116/78  02/20/18 120/82  01/16/18 130/84    Wt Readings from Last 3 Encounters:  03/01/18 185 lb (83.9 kg)  02/20/18 185 lb 14.4 oz (84.3 kg)  01/16/18 185 lb 1.6 oz (84 kg)    General appearance: alert, cooperative and appears stated age Right upper arm without obvious swelling, tender over AC joint  , right anterior neck with mild fullness and tenderness along  SCM head and trapezius muscle.  Neck: no adenopathy, no carotid  bruit, supple, symmetrical, trachea midline and thyroid not enlarged, symmetric, no tenderness/mass/nodules Back: symmetric, no curvature. ROM normal. No CVA tenderness. Lungs: clear to auscultation bilaterally Heart: regular rate and rhythm, S1, S2 normal, no murmur, click, rub or gallop Abdomen: soft, non-tender; bowel sounds normal; no masses,  no organomegaly Pulses: 2+ and symmetric Skin: Skin color, texture, turgor normal. No rashes or lesions Lymph nodes: Cervical, supraclavicular, and axillary nodes normal.  No results found for: HGBA1C  Lab Results  Component Value Date   CREATININE 0.61 10/30/2017   CREATININE 0.65 06/01/2017     CREATININE 0.63 11/29/2016    Lab Results  Component Value Date   WBC 6.6 06/01/2017   HGB 14.3 06/01/2017   HCT 41.5 06/01/2017   PLT 202 06/01/2017   GLUCOSE 90 10/30/2017   CHOL 168 11/14/2017   TRIG 99.0 11/14/2017   HDL 52.50 11/14/2017   LDLDIRECT 113.0 10/30/2017   LDLCALC 96 11/14/2017   ALT 18 10/30/2017   AST 22 10/30/2017   NA 138 10/30/2017   K 3.9 11/14/2017   CL 99 10/30/2017   CREATININE 0.61 10/30/2017   BUN 7 10/30/2017   CO2 30 10/30/2017   TSH 2.34 10/30/2017    Mm Diag Breast Tomo Bilateral  Result Date: 03/07/2017 CLINICAL DATA:  History of left breast cancer in 2013 status post lumpectomy and radiation therapy. EXAM: 2D DIGITAL DIAGNOSTIC BILATERAL MAMMOGRAM WITH CAD AND ADJUNCT TOMO COMPARISON:  Previous exam(s). ACR Breast Density Category b: There are scattered areas of fibroglandular density. FINDINGS: There are stable postsurgical changes within the left breast. There are no new dominant masses, suspicious calcifications or secondary signs of malignancy within either breast. Mammographic images were processed with CAD. IMPRESSION: No evidence of malignancy within either breast. Stable postsurgical changes within the left breast. RECOMMENDATION: Bilateral diagnostic mammogram in 1 year. I have discussed the findings and recommendations with the patient. Results were also provided in writing at the conclusion of the visit. If applicable, a reminder letter will be sent to the patient regarding the next appointment. BI-RADS CATEGORY  2: Benign. Electronically Signed   By: Franki Cabot M.D.   On: 03/07/2017 09:46    Assessment & Plan:   Problem List Items Addressed This Visit    Insomnia    She denies having any symptoms of anxiety or depression but does admit to having "a lot on her mind.",.  She did not tolerate remeron trial. Recommended trial of Headspace app on her I phone to help clear her mind       Pain in right upper arm    Accompanied by pain  and swelling also involving the right neck.  STAT venous doppler was ordered to rule out DVT .  All veins were without occlusion..  Instructed patient to treat as a muscle sprain with NSAIDs, tylenol, ice and rest.        Other Visit Diagnoses    Swelling in right armpit    -  Primary   Relevant Orders   US Venous Img Upper Uni Right (Completed)    A total of 25 minutes of face to face time was spent with patient more than half of which was spent in counselling about the above mentioned conditions  and coordination of care   I have discontinued Larayah S. Tonnesen's mirtazapine. I am also having her maintain her lisinopril, hydrochlorothiazide, amLODipine, Wrist Splint/Cock-Up/Left M, Wrist Splint/Cock-Up/Right M, and tamoxifen.  No orders of the defined types were placed in this  encounter.   Medications Discontinued During This Encounter  Medication Reason  . mirtazapine (REMERON) 15 MG tablet Completed Course    Follow-up: No follow-ups on file.   Crecencio Mc, MD

## 2018-03-01 NOTE — Patient Instructions (Addendum)
   I have ordered a STAT ultrasound of your right arm to rule out  BLOOD CLOT  Your appt is at  [facility]   For your insomnia:  Try the Headspace app on your phone (it's a meditation instructional )

## 2018-03-03 DIAGNOSIS — M79621 Pain in right upper arm: Secondary | ICD-10-CM | POA: Insufficient documentation

## 2018-03-03 DIAGNOSIS — G4700 Insomnia, unspecified: Secondary | ICD-10-CM | POA: Insufficient documentation

## 2018-03-03 NOTE — Assessment & Plan Note (Signed)
Accompanied by pain and swelling also involving the right neck.  STAT venous doppler was ordered to rule out DVT .  All veins were without occlusion..  Instructed patient to treat as a muscle sprain with NSAIDs, tylenol, ice and rest.

## 2018-03-03 NOTE — Assessment & Plan Note (Signed)
She denies having any symptoms of anxiety or depression but does admit to having "a lot on her mind.",.  She did not tolerate remeron trial. Recommended trial of Headspace app on her I phone to help clear her mind

## 2018-03-06 ENCOUNTER — Telehealth: Payer: Self-pay

## 2018-03-06 NOTE — Telephone Encounter (Signed)
Copied from [place] [id number]. Topic: General - Other >> Mar 06, 2018  8:04 AM Amanda Liu wrote:  Reason for CRM: pt calling stating that she has a slight cough small fever of 98.3 sore throat sinus drainage would like a RX called into her pharmacy pt states that she doesn't want to come into office she has tried OTC medicine nothing helped mucinex ibuprofen vics vapor rub and heat      [place], [place] [phone] (Phone) [phone] (Fax)

## 2018-03-07 DIAGNOSIS — J069 Acute upper respiratory infection, unspecified: Secondary | ICD-10-CM | POA: Diagnosis not present

## 2018-03-07 NOTE — Telephone Encounter (Signed)
Pt called and stated that she went to urgent care and no need to call back

## 2018-03-08 NOTE — Telephone Encounter (Signed)
Patient was seen at Urgent care

## 2018-03-10 DIAGNOSIS — H6691 Otitis media, unspecified, right ear: Secondary | ICD-10-CM | POA: Diagnosis not present

## 2018-03-10 DIAGNOSIS — J069 Acute upper respiratory infection, unspecified: Secondary | ICD-10-CM | POA: Diagnosis not present

## 2018-03-28 ENCOUNTER — Ambulatory Visit
Admission: RE | Admit: 2018-03-28 | Discharge: 2018-03-28 | Disposition: A | Discharge status: Home / Self Care | Payer: Medicare Other | Source: Ambulatory Visit | Attending: Oncology | Admitting: Oncology

## 2018-03-28 DIAGNOSIS — Z170 Estrogen receptor positive status [ER+]: Secondary | ICD-10-CM | POA: Insufficient documentation

## 2018-03-28 DIAGNOSIS — C50912 Malignant neoplasm of unspecified site of left female breast: Secondary | ICD-10-CM

## 2018-03-28 DIAGNOSIS — Z1231 Encounter for screening mammogram for malignant neoplasm of breast: Secondary | ICD-10-CM | POA: Insufficient documentation

## 2018-03-28 DIAGNOSIS — Z780 Asymptomatic menopausal state: Secondary | ICD-10-CM | POA: Insufficient documentation

## 2018-03-28 DIAGNOSIS — T386X5D Adverse effect of antigonadotrophins, antiestrogens, antiandrogens, not elsewhere classified, subsequent encounter: Secondary | ICD-10-CM | POA: Diagnosis not present

## 2018-03-28 DIAGNOSIS — Z853 Personal history of malignant neoplasm of breast: Secondary | ICD-10-CM | POA: Insufficient documentation

## 2018-04-29 ENCOUNTER — Other Ambulatory Visit: Payer: Self-pay | Admitting: Family Medicine

## 2018-04-29 ENCOUNTER — Other Ambulatory Visit: Payer: Self-pay | Admitting: Oncology

## 2018-05-02 ENCOUNTER — Ambulatory Visit: Payer: Medicare Other

## 2018-05-22 ENCOUNTER — Other Ambulatory Visit: Payer: Self-pay | Admitting: Family Medicine

## 2018-06-06 ENCOUNTER — Telehealth: Payer: Self-pay | Admitting: *Deleted

## 2018-06-06 ENCOUNTER — Ambulatory Visit (INDEPENDENT_AMBULATORY_CARE_PROVIDER_SITE_OTHER): Payer: Medicare Other | Admitting: Family Medicine

## 2018-06-06 ENCOUNTER — Telehealth: Payer: Self-pay | Admitting: Family Medicine

## 2018-06-06 ENCOUNTER — Encounter: Payer: Self-pay | Admitting: Family Medicine

## 2018-06-06 VITALS — BP 102/78 | HR 63 | Temp 98.7°F | Ht 66.0 in | Wt 176.4 lb

## 2018-06-06 DIAGNOSIS — M8580 Other specified disorders of bone density and structure, unspecified site: Secondary | ICD-10-CM | POA: Diagnosis not present

## 2018-06-06 DIAGNOSIS — Z853 Personal history of malignant neoplasm of breast: Secondary | ICD-10-CM | POA: Diagnosis not present

## 2018-06-06 DIAGNOSIS — F419 Anxiety disorder, unspecified: Secondary | ICD-10-CM

## 2018-06-06 DIAGNOSIS — T451X5A Adverse effect of antineoplastic and immunosuppressive drugs, initial encounter: Secondary | ICD-10-CM | POA: Diagnosis not present

## 2018-06-06 DIAGNOSIS — R7309 Other abnormal glucose: Secondary | ICD-10-CM | POA: Diagnosis not present

## 2018-06-06 DIAGNOSIS — R7303 Prediabetes: Secondary | ICD-10-CM | POA: Insufficient documentation

## 2018-06-06 DIAGNOSIS — Z1211 Encounter for screening for malignant neoplasm of colon: Secondary | ICD-10-CM

## 2018-06-06 DIAGNOSIS — G620 Drug-induced polyneuropathy: Secondary | ICD-10-CM | POA: Diagnosis not present

## 2018-06-06 DIAGNOSIS — I10 Essential (primary) hypertension: Secondary | ICD-10-CM

## 2018-06-06 LAB — BASIC METABOLIC PANEL
BUN: 9 mg/dL (ref 6–23)
CO2: 29 mEq/L (ref 19–32)
CREATININE: 0.57 mg/dL (ref 0.40–1.20)
Calcium: 9.7 mg/dL (ref 8.4–10.5)
Chloride: 101 mEq/L (ref 96–112)
GFR: 113.9 mL/min (ref >60.00)
GLUCOSE: 89 mg/dL (ref 70–99)
Potassium: 3.8 mEq/L (ref 3.5–5.1)
Sodium: 138 mEq/L (ref 135–145)

## 2018-06-06 LAB — HEMOGLOBIN A1C: HEMOGLOBIN A1C: 6 % (ref 4.6–6.5)

## 2018-06-06 NOTE — Telephone Encounter (Signed)
Please contact the patient.  After she left I noticed that she had a Cologuard ordered previously though this does not appear to have been completed.  Please contact her and see if she would like to proceed with Cologuard for colon cancer screening or if she would like to proceed with a colonoscopy.  If she would like to do Cologuard please confirm that she has no history of polyps, family history of colon cancer, or rectal bleeding.  Thanks.

## 2018-06-06 NOTE — Assessment & Plan Note (Signed)
Continue calcium and vitamin D supplementation.  

## 2018-06-06 NOTE — Assessment & Plan Note (Signed)
Stable.  Patient defers medication.  She will monitor.

## 2018-06-06 NOTE — Assessment & Plan Note (Signed)
She will continue to follow with oncology. 

## 2018-06-06 NOTE — Telephone Encounter (Signed)
Called patient and left a VM to call back. CRM created and sent to [facility] pool.  

## 2018-06-06 NOTE — Patient Instructions (Signed)
Nice to see you. We will check lab work today and contact you with the results. Please try to avoid watching TV the hour prior to bed.

## 2018-06-06 NOTE — Telephone Encounter (Signed)
Copied from [place] [id number]. Topic: Quick Communication - See Telephone Encounter >> Jun 06, 2018  1:30 PM Myriam Forehand, [place] wrote: CRM for notification. See Telephone encounter for: 06/06/18. >> Jun 06, 2018  4:53 PM Yvette Rack wrote: Pt returned call to office. Pt stated that she would like to proceed with Cologuard. Pt stated that she has no history of polyps, family history of colon cancer, or any rectal bleeding.

## 2018-06-06 NOTE — Assessment & Plan Note (Signed)
Asymptomatic.  She will monitor.  Discussed not watching TV the hour before bed.

## 2018-06-06 NOTE — Assessment & Plan Note (Signed)
Well-controlled.  Continue current regimen.  Check BMP. 

## 2018-06-06 NOTE — Progress Notes (Signed)
  Tommi Rumps, MD Phone: [phone]  Amanda Liu is a 63 y.o. female who presents today for follow-up.  CC: Hypertension, osteopenia, anxiety/sleep issues, breast cancer history  Hypertension: Not checking blood pressures.  Taking amlodipine, lisinopril, and HCTZ.  No chest pain, shortness of breath, or edema.  Osteopenia: She is taking calcium and vitamin D.  Her only history of fracture is her ribs 2-1/2 years ago with a significant injury.  She has no recurrent pain.  Sleep difficulty/anxiety: Notes this is improved significantly.  She notes no anxiety.  She was unable to tolerate the Remeron.  She no longer is on this.  No alcohol use or evening caffeine intake.  She does have the TV on at times at night prior to going to bed.  History of breast cancer: She continues on tamoxifen.  She is seeing oncology.  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  She reports she had a previous URI though this has improved.  Social History   Tobacco Use  Smoking Status Former Smoker  Smokeless Tobacco Never Used     ROS see history of present illness  Objective  Physical Exam Vitals:   06/06/18 1023  BP: 102/78  Pulse: 63  Temp: 98.7 F (37.1 C)  SpO2: 95%    BP Readings from Last 3 Encounters:  06/06/18 102/78  03/01/18 116/78  02/20/18 120/82   Wt Readings from Last 3 Encounters:  06/06/18 176 lb 6.4 oz (80 kg)  03/01/18 185 lb (83.9 kg)  02/20/18 185 lb 14.4 oz (84.3 kg)    Physical Exam Constitutional:      General: She is not in acute distress.    Appearance: She is not diaphoretic.  Cardiovascular:     Rate and Rhythm: Normal rate and regular rhythm.     Heart sounds: Normal heart sounds.  Pulmonary:     Effort: Pulmonary effort is normal.     Breath sounds: Normal breath sounds.  Skin:    General: Skin is warm and dry.  Neurological:     Mental Status: She is alert.      Assessment/Plan: Please see  individual problem list.  Essential hypertension Well-controlled.  Continue current regimen.  Check BMP.  Neuropathy due to chemotherapeutic drug (HCC) Stable.  Patient defers medication.  She will monitor.  Osteopenia Continue calcium and vitamin D supplementation.  Anxiety Asymptomatic.  She will monitor.  Discussed not watching TV the hour before bed.  Elevated glucose Check A1c.  History of breast cancer in female She will continue to follow with oncology.  Health maintenance: CMA to contact patient regarding colon cancer screening options.  Please see phone note.  Orders Placed This Encounter  Procedures  . Basic Metabolic Panel (BMET)  . HgB A1c    No orders of the defined types were placed in this encounter.    Tommi Rumps, MD [place] Follow

## 2018-06-06 NOTE — Assessment & Plan Note (Signed)
Check A1c. 

## 2018-06-07 NOTE — Telephone Encounter (Signed)
Cologuard order printed.  Please let the patient know that if the Cologuard is positive she will need to undergo a colonoscopy.  If this occurs the colonoscopy may not be covered by her insurance as it will no longer be a screening test and will be a diagnostic test.  If she were to go straight to a colonoscopy it would be deemed a screening test and her insurance would likely cover this.  If she understands this and still wants to do the Cologuard we can send the Cologuard order form in.

## 2018-06-07 NOTE — Addendum Note (Signed)
Addended by: Leone Haven on: 06/07/2018 05:19 PM   Modules accepted: Orders

## 2018-06-07 NOTE — Telephone Encounter (Signed)
Duplicated

## 2018-06-07 NOTE — Telephone Encounter (Signed)
Pt returned call to office. Pt stated that she would like to proceed with Cologuard. Pt stated that she has no history of polyps, family history of colon cancer, or any rectal bleeding.  Sent to PCP

## 2018-06-08 NOTE — Addendum Note (Signed)
Addended by: Leone Haven on: 06/08/2018 03:33 PM   Modules accepted: Orders

## 2018-06-08 NOTE — Telephone Encounter (Signed)
Could you give me the orders for this so I can fax if you have it?  Thanks

## 2018-06-08 NOTE — Telephone Encounter (Signed)
Printed again. Please relay the information in the prior message to the patient.

## 2018-06-08 NOTE — Telephone Encounter (Signed)
Called and spoke with patient. Pt advised and voiced understanding. She stilled wanted to do the cologuard test. This has been faxed.

## 2018-06-20 ENCOUNTER — Ambulatory Visit: Payer: Medicare Other

## 2018-08-02 ENCOUNTER — Other Ambulatory Visit: Payer: Self-pay | Admitting: Family Medicine

## 2018-09-04 ENCOUNTER — Telehealth: Payer: Self-pay

## 2018-09-04 NOTE — Telephone Encounter (Signed)
Called and spoke to pt.  Pt c/o having a rash on her left side and under her left arm down to her elbow.  Pt believes this to be shingles since she had shingles 3 or 4 years ago and is familiar w/ the symptoms.  Pt said that rash area burns and tingles.  Pt said symptoms began days ago.  Pt said that pain is mild. No fever.  Informed pt that PCP is not in the office today.  Offered pt virtual appt w/ another provider today.  Pt declined and prefers a phone appt and would like to scheduled appointment w/ her PCP.  Pt ok to wait for PCP to return tomorrow.  Pt scheduled for a telephone visit w/ PCP for tomorrow 09/05/18 @ 3:15 pm.

## 2018-09-04 NOTE — Telephone Encounter (Signed)
Copied from [place] [id number]. Topic: Appointment Scheduling - Scheduling Inquiry for Clinic >> Sep 03, 2018  4:58 PM Valla Leaver wrote: Reason for CRM: Needs appt for what she thinks is shingles. Email on file an dphone correct.

## 2018-09-05 ENCOUNTER — Ambulatory Visit: Payer: Medicare Other | Admitting: Family Medicine

## 2018-09-11 ENCOUNTER — Ambulatory Visit: Payer: Medicare Other | Admitting: Family Medicine

## 2018-10-18 DIAGNOSIS — H2510 Age-related nuclear cataract, unspecified eye: Secondary | ICD-10-CM | POA: Diagnosis not present

## 2018-10-18 DIAGNOSIS — H5213 Myopia, bilateral: Secondary | ICD-10-CM | POA: Diagnosis not present

## 2018-11-01 ENCOUNTER — Other Ambulatory Visit: Payer: Self-pay | Admitting: Family Medicine

## 2018-11-16 ENCOUNTER — Other Ambulatory Visit: Payer: Self-pay | Admitting: Family Medicine

## 2018-12-05 ENCOUNTER — Ambulatory Visit: Payer: Medicare Other | Admitting: Family Medicine

## 2019-01-17 ENCOUNTER — Inpatient Hospital Stay: Payer: Medicare Other | Admitting: Oncology

## 2019-01-27 ENCOUNTER — Other Ambulatory Visit: Payer: Self-pay | Admitting: Family Medicine

## 2019-01-28 ENCOUNTER — Other Ambulatory Visit: Payer: Self-pay

## 2019-01-28 ENCOUNTER — Ambulatory Visit (INDEPENDENT_AMBULATORY_CARE_PROVIDER_SITE_OTHER): Payer: Medicare Other | Admitting: Family Medicine

## 2019-01-28 ENCOUNTER — Encounter: Payer: Self-pay | Admitting: Family Medicine

## 2019-01-28 VITALS — Ht 66.0 in | Wt 166.0 lb

## 2019-01-28 DIAGNOSIS — J988 Other specified respiratory disorders: Secondary | ICD-10-CM

## 2019-01-28 DIAGNOSIS — Z853 Personal history of malignant neoplasm of breast: Secondary | ICD-10-CM | POA: Diagnosis not present

## 2019-01-28 DIAGNOSIS — R5383 Other fatigue: Secondary | ICD-10-CM

## 2019-01-28 DIAGNOSIS — M898X9 Other specified disorders of bone, unspecified site: Secondary | ICD-10-CM | POA: Diagnosis not present

## 2019-01-28 DIAGNOSIS — I10 Essential (primary) hypertension: Secondary | ICD-10-CM

## 2019-01-28 DIAGNOSIS — Z1231 Encounter for screening mammogram for malignant neoplasm of breast: Secondary | ICD-10-CM

## 2019-01-28 DIAGNOSIS — B9789 Other viral agents as the cause of diseases classified elsewhere: Secondary | ICD-10-CM | POA: Diagnosis not present

## 2019-01-28 DIAGNOSIS — J309 Allergic rhinitis, unspecified: Secondary | ICD-10-CM | POA: Diagnosis not present

## 2019-01-28 DIAGNOSIS — M79602 Pain in left arm: Secondary | ICD-10-CM | POA: Diagnosis not present

## 2019-01-28 DIAGNOSIS — R7303 Prediabetes: Secondary | ICD-10-CM | POA: Diagnosis not present

## 2019-01-28 MED ORDER — AMLODIPINE BESYLATE 5 MG PO TABS: ORAL_TABLET | ORAL | 1 refills | Status: DC

## 2019-01-28 MED ORDER — LISINOPRIL 10 MG PO TABS: ORAL_TABLET | ORAL | 1 refills | Status: DC

## 2019-01-28 MED ORDER — HYDROCHLOROTHIAZIDE 25 MG PO TABS: ORAL_TABLET | ORAL | 1 refills | Status: DC

## 2019-01-28 NOTE — Assessment & Plan Note (Addendum)
Patient with a history of breast cancer.  Mammogram has been ordered.  I will send my note to her oncologist to let them know that the patient will not be following up with at the patient's request.  Will check to see if there is anything we need to do other than yearly breast exam and mammogram.  I did discuss the risk of her coming off the tamoxifen earlier than recommended.  Discussed that there would be an elevated risk of breast cancer recurrence and that the tamoxifen would help lower that risk.  She continues to want to stay off of this medication.

## 2019-01-28 NOTE — Assessment & Plan Note (Signed)
I suspect she had some viral illness in October 2019.  She has had no recurrence of symptoms.  Discussed social distancing precautions and sick precautions regarding COVID-19.

## 2019-01-28 NOTE — Assessment & Plan Note (Signed)
Undetermined control.  We will have her come in for nurse BP check and labs.  Continue current regimen.  Refills given.

## 2019-01-28 NOTE — Progress Notes (Signed)
Virtual Visit via video Note  This visit type was conducted due to national recommendations for restrictions regarding the COVID-19 pandemic (e.g. social distancing).  This format is felt to be most appropriate for this patient at this time.  All issues noted in this document were discussed and addressed.  No physical exam was performed (except for noted visual exam findings with Video Visits).   I connected with Amanda Liu today at  8:30 AM EDT by a video enabled telemedicine application or telephone and verified that I am speaking with the correct person using two identifiers. Location patient: home Location provider: work Persons participating in the virtual visit: patient, provider  I discussed the limitations, risks, security and privacy concerns of performing an evaluation and management service by telephone and the availability of in person appointments. I also discussed with the patient that there may be a patient responsible charge related to this service. The patient expressed understanding and agreed to proceed.   Reason for visit: follow-up  HPI: HYPERTENSION  Disease Monitoring  Home BP Monitoring not checking Chest pain- no    Dyspnea- no Medications  Compliance-  Amlodipine, lisinopril, hctz.  Edema- no  History of breast cancer: Patient notes she does not want to see oncology anymore.  She states all they were doing was an exam and checking a mammogram.  She has stopped her tamoxifen after 6 years as she was having significant side effects with bone pain, fatigue, and hot flashes.  She feels quite a bit better off the tamoxifen.  She has not noticed any new lumps in her breasts or nipple discharge.  Allergic rhinitis: Patient notes her symptoms have improved though over the last several weeks she has had some goopy itchy eyes, some postnasal drip, minimal rhinorrhea, some congestion, and some sneezing.  This is all consistent with prior allergy symptoms.  No fevers.   24-hour allergy medicine does help some.  Respiratory illness: Patient notes she was sick in October 2019.  She was seen at an urgent care 3 times for this.  She had a fever and minimal cough with drainage at that time.  She took antibiotics and used a nasal spray.  It took her about a month to start feeling back to normal.  Left arm pain: Patient notes recently she had an episode where she had discomfort radiating down her left arm to her thumb over the radial aspect of her left arm.  She was unsure what the cause was though she then saw to bee stings in her left inner upper arm where she does not have much sensation related to her prior breast cancer surgery and lymph node resection.  She notes the pain lasted just for a few minutes and has not recurred.     ROS: See pertinent positives and negatives per HPI.  Past Medical History:  Diagnosis Date  . Allergic rhinitis   . Breast cancer ([place]) 02/2012   left breast lumpectomy, radiation and chemotherapy  . Chickenpox   . Elevated blood pressure   . Fatigue due to depression 06/18/2015  . Personal history of chemotherapy 08/2011   left breast ca  . Personal history of radiation therapy 03/2012-05/2012   left breast ca  . UTI (lower urinary tract infection)     Past Surgical History:  Procedure Laterality Date  . ABDOMINAL HYSTERECTOMY    . BREAST BIOPSY Left 07/2011   left breast invasive mammary carcinoma  . BREAST LUMPECTOMY Left 03/16/2012   invasive mammary  carcinoma: invasive ductal carcinoma. clear margins after reexcision. 5 LN positive for macrometastasis  . TONSILLECTOMY      Family History  Problem Relation Age of Onset  . Breast cancer Sister 6  . Breast cancer Maternal Grandmother 80  . Alcoholism Unknown        Parent  . Alcohol abuse Father   . Post-traumatic stress disorder Daughter        mental/emotional abuse  . Eating disorder Daughter     SOCIAL HX: Former smoker.   Current Outpatient Medications:   .  amLODipine (NORVASC) 5 MG tablet, TAKE 1 TABLET(5 MG) BY MOUTH DAILY, Disp: 90 tablet, Rfl: 1 .  Elastic Bandages & Supports (WRIST SPLINT/COCK-UP/LEFT M) MISC, Apply nightly, Disp: 1 each, Rfl: 0 .  Elastic Bandages & Supports (WRIST SPLINT/COCK-UP/RIGHT M) MISC, Applied nightly, Disp: 1 each, Rfl: 0 .  hydrochlorothiazide (HYDRODIURIL) 25 MG tablet, TAKE 1 TABLET(25 MG) BY MOUTH DAILY, Disp: 90 tablet, Rfl: 1 .  lisinopril (ZESTRIL) 10 MG tablet, TAKE 1 TABLET(10 MG) BY MOUTH DAILY, Disp: 90 tablet, Rfl: 1 .  tamoxifen (NOLVADEX) 10 MG tablet, TAKE 1 TABLET(10 MG) BY MOUTH DAILY (Patient taking differently: Take 1 tablet every other day), Disp: 30 tablet, Rfl: 0  EXAM:  VITALS per patient if applicable: None.  GENERAL: alert, oriented, appears well and in no acute distress  HEENT: atraumatic, conjunttiva clear, no obvious abnormalities on inspection of external nose and ears  NECK: normal movements of the head and neck  LUNGS: on inspection no signs of respiratory distress, breathing rate appears normal, no obvious gross SOB, gasping or wheezing  CV: no obvious cyanosis  MS: moves all visible extremities without noticeable abnormality  PSYCH/NEURO: pleasant and cooperative, no obvious depression or anxiety, speech and thought processing grossly intact  ASSESSMENT AND PLAN:  Discussed the following assessment and plan:  Essential hypertension Undetermined control.  We will have her come in for nurse BP check and labs.  Continue current regimen.  Refills given.  Allergic rhinitis Symptoms are consistent with allergic rhinitis.  She will trial Flonase.  Viral respiratory illness I suspect she had some viral illness in October 2019.  She has had no recurrence of symptoms.  Discussed social distancing precautions and sick precautions regarding COVID-19.  Bone pain Resolved off of tamoxifen.  Fatigue Resolved off of tamoxifen.  History of breast cancer in female Patient  with a history of breast cancer.  Mammogram has been ordered.  I will send my note to her oncologist to let them know that the patient will not be following up with at the patient's request.  Will check to see if there is anything we need to do other than yearly breast exam and mammogram.  I did discuss the risk of her coming off the tamoxifen earlier than recommended.  Discussed that there would be an elevated risk of breast cancer recurrence and that the tamoxifen would help lower that risk.  She continues to want to stay off of this medication.  Left arm pain Seems to be radicular given description of symptoms.  Could be related to the bee stings.  She has had no recurrence.  She will monitor.    I discussed the assessment and treatment plan with the patient. The patient was provided an opportunity to ask questions and all were answered. The patient agreed with the plan and demonstrated an understanding of the instructions.   The patient was advised to call back or seek an in-person evaluation  if the symptoms worsen or if the condition fails to improve as anticipated.    Tommi Rumps, MD

## 2019-01-28 NOTE — Assessment & Plan Note (Signed)
Seems to be radicular given description of symptoms.  Could be related to the bee stings.  She has had no recurrence.  She will monitor.

## 2019-01-28 NOTE — Assessment & Plan Note (Signed)
Resolved off of tamoxifen.

## 2019-01-28 NOTE — Assessment & Plan Note (Signed)
Symptoms are consistent with allergic rhinitis.  She will trial Flonase.

## 2019-01-29 ENCOUNTER — Telehealth: Payer: Self-pay | Admitting: Family Medicine

## 2019-01-29 NOTE — Telephone Encounter (Signed)
-----   Message from Sindy Guadeloupe, MD sent at 01/28/2019  9:17 AM EDT ----- Hello Dr. Caryl Bis,  Sure that's fine. She would not need anything extra other tham mammogram and yearly breast exam. If she has new signs/symptoms such as pain etc- scans would be warranted.  Regards, Archana ----- Message ----- From: Leone Haven, MD Sent: 01/28/2019   9:09 AM EDT To: Sindy Guadeloupe, MD  Hi Dr Janese Banks,   I saw Mrs Alcantara today for follow-up. She noted that she was off of the tamoxifen and would like to have her mammograms and breast exams through my office to avoid any extra visits. I wanted to make you aware of this and see if there was anything I needed to do other than a yearly breast exam and mammogram for her. I also did discuss the risk of being off the tamoxifen with her though she continues to want to stay off the medication.   Tommi Rumps

## 2019-01-29 NOTE — Telephone Encounter (Signed)
See message from Dr Janese Banks.

## 2019-04-02 ENCOUNTER — Ambulatory Visit
Admission: RE | Admit: 2019-04-02 | Discharge: 2019-04-02 | Disposition: A | Discharge status: Home / Self Care | Payer: Medicare Other | Source: Ambulatory Visit | Attending: Family Medicine | Admitting: Family Medicine

## 2019-04-02 DIAGNOSIS — Z1231 Encounter for screening mammogram for malignant neoplasm of breast: Secondary | ICD-10-CM | POA: Diagnosis not present

## 2019-05-21 ENCOUNTER — Other Ambulatory Visit: Payer: Self-pay

## 2019-05-21 DIAGNOSIS — I10 Essential (primary) hypertension: Secondary | ICD-10-CM

## 2019-05-21 MED ORDER — LISINOPRIL 10 MG PO TABS: ORAL_TABLET | ORAL | 1 refills | Status: DC

## 2019-07-23 ENCOUNTER — Other Ambulatory Visit: Payer: Self-pay

## 2019-07-23 DIAGNOSIS — I10 Essential (primary) hypertension: Secondary | ICD-10-CM

## 2019-07-23 MED ORDER — LISINOPRIL 10 MG PO TABS: ORAL_TABLET | ORAL | 1 refills | Status: DC

## 2019-07-23 MED ORDER — AMLODIPINE BESYLATE 5 MG PO TABS: ORAL_TABLET | ORAL | 1 refills | Status: DC

## 2019-07-23 MED ORDER — HYDROCHLOROTHIAZIDE 25 MG PO TABS: ORAL_TABLET | ORAL | 1 refills | Status: DC

## 2019-07-23 NOTE — Progress Notes (Signed)
A refill request for BP meds, lisinopril, norvasc and HCTZ was sent to the patient's new pharmacy.  Briahnna Harries,cma

## 2019-07-30 ENCOUNTER — Other Ambulatory Visit: Payer: Self-pay | Admitting: Family Medicine

## 2019-07-30 DIAGNOSIS — I10 Essential (primary) hypertension: Secondary | ICD-10-CM

## 2019-09-16 ENCOUNTER — Ambulatory Visit: Payer: Medicare Other

## 2019-09-23 ENCOUNTER — Telehealth: Payer: Self-pay

## 2019-09-23 ENCOUNTER — Ambulatory Visit: Payer: Medicare HMO

## 2019-09-23 NOTE — Telephone Encounter (Signed)
Failed attempt to reach patient for scheduled awv. No answer, LMTCB. Reschedule as appropriate.

## 2019-11-25 ENCOUNTER — Ambulatory Visit (INDEPENDENT_AMBULATORY_CARE_PROVIDER_SITE_OTHER): Payer: Medicare HMO

## 2019-11-25 VITALS — Ht 66.0 in | Wt 166.0 lb

## 2019-11-25 DIAGNOSIS — Z0000 Encounter for general adult medical examination without abnormal findings: Secondary | ICD-10-CM | POA: Diagnosis not present

## 2019-11-25 NOTE — Patient Instructions (Addendum)
  [name] , Thank you for taking time to come for your Medicare Wellness Visit. I appreciate your ongoing commitment to your health goals. Please review the following plan we discussed and let me know if I can assist you in the future.   These are the goals we discussed: Goals      Patient Stated   .  Increase physical activity (pt-stated)      Other   .  Maintain Healthy Lifestyle      Stay hydrated Walk for exercise Low carb foods       This is a list of the screening recommended for you and due dates:  Health Maintenance  Topic Date Due  . HIV Screening  Never done  . Tetanus Vaccine  Never done  . COVID-19 Vaccine (1) 12/11/2019*  . Cologuard (Stool DNA test)  11/24/2020*  . Flu Shot  12/29/2019  . Mammogram  04/01/2021  .  Hepatitis C: One time screening is recommended by Center for Disease Control  (CDC) for  adults born from [birth year] through 1965.   Completed  . Pap Smear  Discontinued  *Topic was postponed. The date shown is not the original due date.

## 2019-11-25 NOTE — Progress Notes (Signed)
I have reviewed the above note and agree.  Bich Mchaney, M.D.  

## 2019-11-25 NOTE — Progress Notes (Signed)
Subjective:   Amanda Liu is a 64 y.o. female who presents for Medicare Annual (Subsequent) preventive examination.  Review of Systems    No ROS.  Medicare Wellness Virtual Visit.    Cardiac Risk Factors include: advanced age (>10mn, >>70women);hypertension     Objective:    Today's Vitals   11/25/19 1137  Weight: 166 lb (75.3 kg)  Height: '5\' 6"'  (1.676 m)   Body mass index is 26.79 kg/m.  Advanced Directives 11/25/2019 01/16/2018 06/01/2017 05/01/2017 11/29/2016 06/28/2016 04/29/2016  Does Patient Have a Medical Advance Directive? No No No No No No No  Does patient want to make changes to medical advance directive? - - - No - Patient declined - - -  Would patient like information on creating a medical advance directive? No - Patient declined No - Patient declined No - Patient declined - - - -    Current Medications (verified) Outpatient Encounter Medications as of 11/25/2019  Medication Sig  . amLODipine (NORVASC) 5 MG tablet TAKE 1 TABLET(5 MG) BY MOUTH DAILY  . Elastic Bandages & Supports (WRIST SPLINT/COCK-UP/LEFT M) MISC Apply nightly  . Elastic Bandages & Supports (WRIST SPLINT/COCK-UP/RIGHT M) MISC Applied nightly  . hydrochlorothiazide (HYDRODIURIL) 25 MG tablet TAKE 1 TABLET(25 MG) BY MOUTH DAILY  . lisinopril (ZESTRIL) 10 MG tablet TAKE 1 TABLET(10 MG) BY MOUTH DAILY  . tamoxifen (NOLVADEX) 10 MG tablet TAKE 1 TABLET(10 MG) BY MOUTH DAILY (Patient not taking: Reported on 11/25/2019)   No facility-administered encounter medications on file as of 11/25/2019.    Allergies (verified) Patient has no known allergies.   History: Past Medical History:  Diagnosis Date  . Allergic rhinitis   . [place]   left breast lumpectomy, radiation and chemotherapy  . Chickenpox   . Elevated blood pressure   . Fatigue due to depression 06/18/2015  . Personal history of chemotherapy 08/2011   left breast ca  . Personal history of radiation therapy 03/2012-05/2012    left breast ca  . UTI (lower urinary tract infection)    Past Surgical History:  Procedure Laterality Date  . ABDOMINAL HYSTERECTOMY    . BREAST BIOPSY Left 07/2011   left breast invasive mammary carcinoma  . BREAST LUMPECTOMY Left 03/16/2012   invasive mammary carcinoma: invasive ductal carcinoma. clear margins after reexcision. 5 LN positive for macrometastasis  . TONSILLECTOMY     Family History  Problem Relation Age of Onset  . Breast cancer Sister 472 . Breast cancer Maternal Grandmother 80  . Alcoholism Other        Parent  . Alcohol abuse Father   . Post-traumatic stress disorder Daughter        mental/emotional abuse  . Eating disorder Daughter    Social History   Socioeconomic History  . Marital status: Married    Spouse name: Not on file  . Number of children: Not on file  . Years of education: Not on file  . Highest education level: Not on file  Occupational History  . Not on file  Tobacco Use  . Smoking status: Former SResearch scientist (life sciences) . Smokeless tobacco: Never Used  Vaping Use  . Vaping Use: Never used  Substance and Sexual Activity  . Alcohol use: No    Alcohol/week: 0.0 standard drinks  . Drug use: No  . Sexual activity: Yes  Other Topics Concern  . Not on file  Social History Narrative  . Not on file   Social Determinants of Health  Financial Resource Strain:   . Difficulty of Paying Living Expenses:   Food Insecurity:   . Worried About Charity fundraiser in the Last Year:   . Arboriculturist in the Last Year:   Transportation Needs:   . Film/video editor (Medical):   Marland Kitchen Lack of Transportation (Non-Medical):   Physical Activity:   . Days of Exercise per Week:   . Minutes of Exercise per Session:   Stress:   . Feeling of Stress :   Social Connections:   . Frequency of Communication with Friends and Family:   . Frequency of Social Gatherings with Friends and Family:   . Attends Religious Services:   . Active Member of Clubs or  Organizations:   . Attends Archivist Meetings:   Marland Kitchen Marital Status:     Tobacco Counseling Counseling given: Not Answered   Clinical Intake:  Pre-visit preparation completed: Yes        Diabetes: No  How often do you need to have someone help you when you read instructions, pamphlets, or other written materials from your doctor or pharmacy?: 1 - Never Interpreter Needed?: No      Activities of Daily Living In your present state of health, do you have any difficulty performing the following activities: 11/25/2019  Hearing? N  Vision? N  Difficulty concentrating or making decisions? N  Walking or climbing stairs? N  Dressing or bathing? N  Doing errands, shopping? N  Preparing Food and eating ? N  Using the Toilet? N  In the past six months, have you accidently leaked urine? N  Do you have problems with loss of bowel control? N  Managing your Medications? N  Managing your Finances? N  Housekeeping or managing your Housekeeping? N  Some recent data might be hidden    Patient Care Team: Leone Haven, MD as PCP - General (Family Medicine)  Indicate any recent Medical Services you may have received from other than [facility] providers in the past year (date may be approximate).     Assessment:   This is a routine wellness examination for Amanda Liu.  I connected with Amanda Liu today by telephone and verified that I am speaking with the correct person using two identifiers. Location patient: home Location provider: work Persons participating in the virtual visit: patient, Marine scientist.    I discussed the limitations, risks, security and privacy concerns of performing an evaluation and management service by telephone and the availability of in person appointments. The patient expressed understanding and verbally consented to this telephonic visit.    Interactive audio and video telecommunications were attempted between this provider and patient, however failed, due to  patient having technical difficulties OR patient did not have access to video capability.  We continued and completed visit with audio only.  Some vital signs may be absent or patient reported.   Hearing/Vision screen  Hearing Screening   '125Hz'  '250Hz'  '500Hz'  '1000Hz'  '2000Hz'  '3000Hz'  '4000Hz'  '6000Hz'  '8000Hz'   Right ear:           Left ear:           Comments: Patient is able to hear conversational tones without difficulty.  No issues reported.  Vision Screening Comments: Followed by Suzie Portela, Dr. Waldemar Dickens Wears corrective lenses Visual acuity not assessed, virtual visit.  They have seen their ophthalmologist. Recommended annual ophthalmology exams for early detection of glaucoma and other disorders of the eye.  Dietary issues and exercise activities discussed: Healthy diet Good water  intake Current Exercise Habits: Home exercise routine, Type of exercise: walking    Goals      Patient Stated   .  Increase physical activity (pt-stated)      Other   .  Maintain Healthy Lifestyle      Stay hydrated Walk for exercise Low carb foods      Depression Screen PHQ 2/9 Scores 11/25/2019 01/28/2019 06/06/2018 05/01/2017 04/29/2016  PHQ - 2 Score 0 0 0 0 0  PHQ- 9 Score - - - 0 -    Fall Risk Fall Risk  11/25/2019 01/28/2019 05/01/2017 04/29/2016  Falls in the past year? 0 0 No No  Number falls in past yr: 0 0 - -  Follow up Falls evaluation completed Falls evaluation completed - -    Handrails in use when climbing stairs? Yes  Home free of loose throw rugs in walkways, pet beds, electrical cords, etc? Yes  Adequate lighting in your home to reduce risk of falls? Yes   ASSISTIVE DEVICES UTILIZED TO PREVENT FALLS: Life alert? No  Use of a cane, walker or w/c? No  Grab bars in the bathroom? No  Shower chair or bench in shower? No  Elevated toilet seat or a handicapped toilet? No   TIMED UP AND GO: Was the test performed? No . Virtual visit.  Medications- No longer taking tamoxifen 68m as of  October 2020.   Cognitive Function: MMSE - Mini Mental State Exam 11/25/2019 05/01/2017  Not completed: Unable to complete -  Orientation to time - 5  Orientation to Place - 5  Registration - 3  Attention/ Calculation - 5  Recall - 3  Language- name 2 objects - 2  Language- repeat - 1  Language- follow 3 step command - 3  Language- read & follow direction - 1  Write a sentence - 1  Copy design - 1  Total score - 30     6CIT Screen 04/29/2016  What Year? 0 points  What month? 0 points  What time? 0 points  Count back from 20 0 points  Months in reverse 0 points  Repeat phrase 0 points  Total Score 0    Immunizations Immunization History  Administered Date(s) Administered  . Influenza,inj,Quad PF,6+ Mos 02/25/2015, 03/18/2016, 05/01/2017, 02/20/2018   TDAP status: Due, Education has been provided regarding the importance of this vaccine. Advised may receive this vaccine at local pharmacy or Health Dept. Aware to provide a copy of the vaccination record if obtained from local pharmacy or Health Dept. Verbalized acceptance and understanding.   Covid-19 vaccine status: Declined, Education has been provided regarding the importance of this vaccine but patient still declined. Advised may receive this vaccine at local pharmacy or Health Dept.or vaccine clinic. Aware to provide a copy of the vaccination record if obtained from local pharmacy or Health Dept. Verbalized acceptance and understanding.  Zostavax completed No   Shingrix Completed?: No.    Education has been provided regarding the importance of this vaccine. Patient has been advised to call insurance company to determine out of pocket expense if they have not yet received this vaccine. Advised may also receive vaccine at local pharmacy or Health Dept. Verbalized acceptance and understanding.  Health Maintenance Health Maintenance  Topic Date Due  . HIV Screening  Never done  . TETANUS/TDAP  Never done  . COVID-19 Vaccine  (1) 12/11/2019 (Originally 07/20/1967)  . Fecal DNA (Cologuard)  11/24/2020 (Originally 07/19/2005)  . INFLUENZA VACCINE  12/29/2019  . MAMMOGRAM  04/01/2021  . Hepatitis C Screening  Completed  . PAP SMEAR-Modifier  Discontinued   Pap Smear- discontinued per patient. No longer needed.   Cologuard- ordered 06/08/18. Declined new kit be ordered at this time.   Labs- Next OV 01/20/20 @ 8:00. Patient will come fasting.   Dental Screening: Recommended annual dental exams for proper oral hygiene. Wears dentures.   Community Resource Referral / Chronic Care Management: CRR required this visit?  No   CCM required this visit?  No    Plan:   Keep all routine maintenance appointments.   Follow up 01/20/20 @ 8:00 Patient will come fasting.  I have personally reviewed and noted the following in the patient's chart:   . Medical and social history . Use of alcohol, tobacco or illicit drugs  . Current medications and supplements . Functional ability and status . Nutritional status . Physical activity . Advanced directives . List of other physicians . Hospitalizations, surgeries, and ER visits in previous 12 months . Vitals . Screenings to include cognitive, depression, and falls . Referrals and appointments  In addition, I have reviewed and discussed with patient certain preventive protocols, quality metrics, and best practice recommendations. A written personalized care plan for preventive services as well as general preventive health recommendations were provided to patient via mychart.     Varney Biles, LPN   10/07/2583

## 2019-11-30 ENCOUNTER — Other Ambulatory Visit: Payer: Self-pay | Admitting: Family Medicine

## 2019-11-30 DIAGNOSIS — I10 Essential (primary) hypertension: Secondary | ICD-10-CM

## 2020-01-20 ENCOUNTER — Other Ambulatory Visit: Payer: Self-pay

## 2020-01-20 ENCOUNTER — Telehealth (INDEPENDENT_AMBULATORY_CARE_PROVIDER_SITE_OTHER): Payer: Medicare HMO | Admitting: Family Medicine

## 2020-01-20 ENCOUNTER — Encounter: Payer: Self-pay | Admitting: Family Medicine

## 2020-01-20 DIAGNOSIS — Z853 Personal history of malignant neoplasm of breast: Secondary | ICD-10-CM

## 2020-01-20 DIAGNOSIS — R7303 Prediabetes: Secondary | ICD-10-CM

## 2020-01-20 DIAGNOSIS — I10 Essential (primary) hypertension: Secondary | ICD-10-CM | POA: Diagnosis not present

## 2020-01-20 NOTE — Assessment & Plan Note (Signed)
History of breast cancer.  Mammogram is been ordered.  The patient knows she can call to schedule her mammogram.  She is currently doing screening mammograms.  Previously followed with oncology though the patient is opted not to follow-up with them further.  She has opted to discontinue the tamoxifen early as well and she understands the risk of recurrent breast cancer when not taking this medication for the intended duration.

## 2020-01-20 NOTE — Assessment & Plan Note (Signed)
Well-controlled.  Continue current regimen.  She will come in for labs.

## 2020-01-20 NOTE — Assessment & Plan Note (Signed)
Check A1c. 

## 2020-01-20 NOTE — Progress Notes (Signed)
I called and scheduled the patient for a lab appointment next week and I also sent a message to color guard about a previous order waiting to get  a response.  Victorine Mcnee,cma

## 2020-01-20 NOTE — Progress Notes (Signed)
Virtual Visit via video Note  This visit type was conducted due to national recommendations for restrictions regarding the COVID-19 pandemic (e.g. social distancing).  This format is felt to be most appropriate for this patient at this time.  All issues noted in this document were discussed and addressed.  No physical exam was performed (except for noted visual exam findings with Video Visits).   I connected with Amanda Liu today at  8:00 AM EDT by a video enabled telemedicine application or telephone and verified that I am speaking with the correct person using two identifiers. Location patient: home Location provider: work  Persons participating in the virtual visit: patient, provider  I discussed the limitations, risks, security and privacy concerns of performing an evaluation and management service by telephone and the availability of in person appointments. I also discussed with the patient that there may be a patient responsible charge related to this service. The patient expressed understanding and agreed to proceed.   Reason for visit: f/u  HPI: HYPERTENSION  Disease Monitoring  Home BP Monitoring 118/80 Chest pain- no    Dyspnea- no Medications  Compliance-  Taking lisinopril, amlodipine, HCTZ.  Edema- no  History of breast cancer: Patient has not been following up with oncology as she has deferred that moving forward.  She notes no masses or breast changes.  She is no longer on tamoxifen and notes she understands the risk of stopping this early.  She reports having taken this medication for about 6 years.     ROS: See pertinent positives and negatives per HPI.  Past Medical History:  Diagnosis Date  . Allergic rhinitis   . Breast cancer ([place]) 02/2012   left breast lumpectomy, radiation and chemotherapy  . Chickenpox   . Elevated blood pressure   . Fatigue due to depression 06/18/2015  . Personal history of chemotherapy 08/2011   left breast ca  . Personal history  of radiation therapy 03/2012-05/2012   left breast ca  . UTI (lower urinary tract infection)     Past Surgical History:  Procedure Laterality Date  . ABDOMINAL HYSTERECTOMY    . BREAST BIOPSY Left 07/2011   left breast invasive mammary carcinoma  . BREAST LUMPECTOMY Left 03/16/2012   invasive mammary carcinoma: invasive ductal carcinoma. clear margins after reexcision. 5 LN positive for macrometastasis  . TONSILLECTOMY      Family History  Problem Relation Age of Onset  . Breast cancer Sister 22  . Breast cancer Maternal Grandmother 80  . Alcoholism Other        Parent  . Alcohol abuse Father   . Post-traumatic stress disorder Daughter        mental/emotional abuse  . Eating disorder Daughter     SOCIAL HX: Former smoker   Current Outpatient Medications:  .  amLODipine (NORVASC) 5 MG tablet, TAKE 1 TABLET EVERY DAY, Disp: 90 tablet, Rfl: 1 .  Elastic Bandages & Supports (WRIST SPLINT/COCK-UP/LEFT M) MISC, Apply nightly, Disp: 1 each, Rfl: 0 .  Elastic Bandages & Supports (WRIST SPLINT/COCK-UP/RIGHT M) MISC, Applied nightly, Disp: 1 each, Rfl: 0 .  hydrochlorothiazide (HYDRODIURIL) 25 MG tablet, TAKE 1 TABLET EVERY DAY, Disp: 90 tablet, Rfl: 1 .  lisinopril (ZESTRIL) 10 MG tablet, TAKE 1 TABLET EVERY DAY, Disp: 90 tablet, Rfl: 1  EXAM:  VITALS per patient if applicable:  GENERAL: alert, oriented, appears well and in no acute distress  HEENT: atraumatic, conjunttiva clear, no obvious abnormalities on inspection of external nose and ears  NECK: normal movements of the head and neck  LUNGS: on inspection no signs of respiratory distress, breathing rate appears normal, no obvious gross SOB, gasping or wheezing  CV: no obvious cyanosis  MS: moves all visible extremities without noticeable abnormality  PSYCH/NEURO: pleasant and cooperative, no obvious depression or anxiety, speech and thought processing grossly intact  ASSESSMENT AND PLAN:  Discussed the following  assessment and plan:  Essential hypertension Well-controlled.  Continue current regimen.  She will come in for labs.  History of breast cancer in female History of breast cancer.  Mammogram is been ordered.  The patient knows she can call to schedule her mammogram.  She is currently doing screening mammograms.  Previously followed with oncology though the patient is opted not to follow-up with them further.  She has opted to discontinue the tamoxifen early as well and she understands the risk of recurrent breast cancer when not taking this medication for the intended duration.  Prediabetes Check A1c.   Orders Placed This Encounter  Procedures  . MM 3D SCREEN BREAST BILATERAL    Standing Status:   Future    Standing Expiration Date:   01/19/2021    Order Specific Question:   Reason for Exam (SYMPTOM  OR DIAGNOSIS REQUIRED)    Answer:   breast cancer screening, history of breast cancer    Order Specific Question:   Preferred imaging location?    Answer:   [facility]  . Lipid panel    Standing Status:   Future    Standing Expiration Date:   01/19/2021  . Comprehensive metabolic panel    Standing Status:   Future    Standing Expiration Date:   01/19/2021  . Hemoglobin A1c    Standing Status:   Future    Standing Expiration Date:   01/19/2021    No orders of the defined types were placed in this encounter.   Health maintenance: Patient declines getting COVID-19 vaccine.  She notes there is nothing I can do today to talk her into this.  I did discuss that the people that are currently being hospitalized and dying from COVID-19 are those that are unvaccinated.  Discussed that the vaccines have been found to be safe.  Encouraged her to get it when she is ready.   I discussed the assessment and treatment plan with the patient. The patient was provided an opportunity to ask questions and all were answered. The patient agreed with the plan and demonstrated an understanding of the  instructions.   The patient was advised to call back or seek an in-person evaluation if the symptoms worsen or if the condition fails to improve as anticipated.   Tommi Rumps, MD

## 2020-01-21 ENCOUNTER — Telehealth: Payer: Self-pay | Admitting: Family Medicine

## 2020-01-21 NOTE — Telephone Encounter (Signed)
LVM to schedule appt for 63m follow up

## 2020-01-27 ENCOUNTER — Other Ambulatory Visit: Payer: Medicare HMO

## 2020-01-30 ENCOUNTER — Encounter: Payer: Self-pay | Admitting: Family Medicine

## 2020-01-30 DIAGNOSIS — R350 Frequency of micturition: Secondary | ICD-10-CM

## 2020-02-06 NOTE — Telephone Encounter (Signed)
Patient called in stated that her urine problem got better and she has an appointment on the 20th  Will call back if it gets worse

## 2020-02-16 ENCOUNTER — Encounter: Payer: Self-pay | Admitting: Family Medicine

## 2020-02-17 ENCOUNTER — Other Ambulatory Visit: Payer: Medicare HMO

## 2020-03-04 ENCOUNTER — Other Ambulatory Visit: Payer: Self-pay

## 2020-03-04 ENCOUNTER — Other Ambulatory Visit (INDEPENDENT_AMBULATORY_CARE_PROVIDER_SITE_OTHER): Payer: Medicare HMO

## 2020-03-04 ENCOUNTER — Other Ambulatory Visit: Payer: Self-pay | Admitting: Family Medicine

## 2020-03-04 DIAGNOSIS — R7303 Prediabetes: Secondary | ICD-10-CM

## 2020-03-04 DIAGNOSIS — I10 Essential (primary) hypertension: Secondary | ICD-10-CM | POA: Diagnosis not present

## 2020-03-04 DIAGNOSIS — R8290 Unspecified abnormal findings in urine: Secondary | ICD-10-CM

## 2020-03-04 DIAGNOSIS — R350 Frequency of micturition: Secondary | ICD-10-CM | POA: Diagnosis not present

## 2020-03-04 LAB — POCT URINALYSIS DIPSTICK
Bilirubin, UA: NEGATIVE
Glucose, UA: NEGATIVE
Ketones, UA: NEGATIVE
Leukocytes, UA: NEGATIVE
Nitrite, UA: NEGATIVE
Protein, UA: NEGATIVE
Spec Grav, UA: 1.01 (ref 1.010–1.025)
Urobilinogen, UA: 0.2 E.U./dL
pH, UA: 5.5 (ref 5.0–8.0)

## 2020-03-04 LAB — LIPID PANEL
Cholesterol: 204 mg/dL — ABNORMAL HIGH (ref 0–200)
HDL: 55.6 mg/dL (ref >39.00)
LDL Cholesterol: 116 mg/dL — ABNORMAL HIGH (ref 0–99)
NonHDL: 148.87
Total CHOL/HDL Ratio: 4
Triglycerides: 165 mg/dL — ABNORMAL HIGH (ref 0.0–149.0)
VLDL: 33 mg/dL (ref 0.0–40.0)

## 2020-03-04 LAB — HEMOGLOBIN A1C: Hgb A1c MFr Bld: 6.3 % (ref 4.6–6.5)

## 2020-03-04 LAB — COMPREHENSIVE METABOLIC PANEL
ALT: 21 U/L (ref 0–35)
AST: 21 U/L (ref 0–37)
Albumin: 4.5 g/dL (ref 3.5–5.2)
Alkaline Phosphatase: 48 U/L (ref 39–117)
BUN: 8 mg/dL (ref 6–23)
CO2: 31 mEq/L (ref 19–32)
Calcium: 9.5 mg/dL (ref 8.4–10.5)
Chloride: 96 mEq/L (ref 96–112)
Creatinine, Ser: 0.62 mg/dL (ref 0.40–1.20)
GFR: 94.88 mL/min (ref >60.00)
Glucose, Bld: 104 mg/dL — ABNORMAL HIGH (ref 70–99)
Potassium: 3.7 mEq/L (ref 3.5–5.1)
Sodium: 135 mEq/L (ref 135–145)
Total Bilirubin: 0.4 mg/dL (ref 0.2–1.2)
Total Protein: 6.8 g/dL (ref 6.0–8.3)

## 2020-03-04 NOTE — Addendum Note (Signed)
Addended by: Tor Netters I on: 03/04/2020 09:44 AM   Modules accepted: Orders

## 2020-03-04 NOTE — Addendum Note (Signed)
Addended by: Tor Netters I on: 03/04/2020 03:12 PM   Modules accepted: Orders

## 2020-03-05 ENCOUNTER — Encounter: Payer: Self-pay | Admitting: Family Medicine

## 2020-03-05 LAB — URINALYSIS, MICROSCOPIC ONLY

## 2020-07-12 ENCOUNTER — Other Ambulatory Visit: Payer: Self-pay | Admitting: Family Medicine

## 2020-07-12 DIAGNOSIS — I10 Essential (primary) hypertension: Secondary | ICD-10-CM

## 2020-07-28 ENCOUNTER — Ambulatory Visit: Payer: Medicare HMO | Admitting: Family Medicine

## 2020-08-05 ENCOUNTER — Ambulatory Visit: Payer: Medicare HMO | Admitting: Family Medicine

## 2020-09-07 ENCOUNTER — Ambulatory Visit: Payer: Medicare HMO | Admitting: Family Medicine

## 2020-10-16 ENCOUNTER — Other Ambulatory Visit: Payer: Self-pay

## 2020-10-23 ENCOUNTER — Encounter: Payer: Self-pay | Admitting: Family Medicine

## 2020-10-23 ENCOUNTER — Telehealth (INDEPENDENT_AMBULATORY_CARE_PROVIDER_SITE_OTHER): Payer: Medicare HMO | Admitting: Family Medicine

## 2020-10-23 DIAGNOSIS — I10 Essential (primary) hypertension: Secondary | ICD-10-CM

## 2020-10-23 DIAGNOSIS — Z853 Personal history of malignant neoplasm of breast: Secondary | ICD-10-CM | POA: Diagnosis not present

## 2020-10-23 DIAGNOSIS — F419 Anxiety disorder, unspecified: Secondary | ICD-10-CM | POA: Diagnosis not present

## 2020-10-23 MED ORDER — TRAZODONE HCL 50 MG PO TABS: 25.0000 mg | ORAL_TABLET | Freq: Every evening | ORAL | 3 refills | Status: DC | PRN

## 2020-10-23 NOTE — Assessment & Plan Note (Signed)
I suspect this is contributing to her sleeping difficulty.  Her caffeine intake is also contributing.  Advised no caffeine after 1 PM.  We will try trazodone to see if that will help with her sleep as well as her anxiety.  She will monitor for drowsiness with this.

## 2020-10-23 NOTE — Assessment & Plan Note (Signed)
Undetermined control.  She will come in for labs and BP check.  She will continue amlodipine 5 mg once daily, HCTZ 25 mg daily, and lisinopril 10 mg daily.

## 2020-10-23 NOTE — Progress Notes (Signed)
Virtual Visit via video Note  This visit type was conducted due to national recommendations for restrictions regarding the COVID-19 pandemic (e.g. social distancing).  This format is felt to be most appropriate for this patient at this time.  All issues noted in this document were discussed and addressed.  No physical exam was performed (except for noted visual exam findings with Video Visits).   I connected with Amanda Liu today at  8:30 AM EDT by a video enabled telemedicine application or telephone and verified that I am speaking with the correct person using two identifiers. Location patient: home Location provider: work Persons participating in the virtual visit: patient, provider  I discussed the limitations, risks, security and privacy concerns of performing an evaluation and management service by telephone and the availability of in person appointments. I also discussed with the patient that there may be a patient responsible charge related to this service. The patient expressed understanding and agreed to proceed.  Reason for visit: f/u  HPI: HYPERTENSION  Disease Monitoring  Home BP Monitoring not checking chest pain- no    Dyspnea- no Medications  Compliance-  Taking amlodipine, HCTZ, lisinopril.   Edema- no  History of breast cancer: The patient is due for mammogram.  She had several extenuating circumstances that caused rescheduling of her prior mammograms.  She will call to get this scheduled.  She reports she still has her port in place.  There is no pain.  It does not bother her.  She wants to see somebody to have this removed.  She feels this was negligence on her part.  Sleeping difficulty: Patient notes trouble sleeping for some time now.  She wakes up frequently or does not sleep.  She notes it is related to stress and anxiety.  She notes her husband has cancer and dealing with that is quite stressful.  No depression.  She tried melatonin with no benefit.  She feels  hung over with Benadryl.  No TV or screen time before bed.  She does not drink any alcohol.  She does have caffeine up until 6 PM.     ROS: See pertinent positives and negatives per HPI.  Past Medical History:  Diagnosis Date  . Allergic rhinitis   . Breast cancer ([place]) 02/2012   left breast lumpectomy, radiation and chemotherapy  . Chickenpox   . Elevated blood pressure   . Fatigue due to depression 06/18/2015  . Personal history of chemotherapy 08/2011   left breast ca  . Personal history of radiation therapy 03/2012-05/2012   left breast ca  . UTI (lower urinary tract infection)     Past Surgical History:  Procedure Laterality Date  . ABDOMINAL HYSTERECTOMY    . BREAST BIOPSY Left 07/2011   left breast invasive mammary carcinoma  . BREAST LUMPECTOMY Left 03/16/2012   invasive mammary carcinoma: invasive ductal carcinoma. clear margins after reexcision. 5 LN positive for macrometastasis  . TONSILLECTOMY      Family History  Problem Relation Age of Onset  . Breast cancer Sister 44  . Breast cancer Maternal Grandmother 80  . Alcoholism Other        Parent  . Alcohol abuse Father   . Post-traumatic stress disorder Daughter        mental/emotional abuse  . Eating disorder Daughter     SOCIAL HX: Former smoker   Current Outpatient Medications:  .  amLODipine (NORVASC) 5 MG tablet, TAKE 1 TABLET EVERY DAY, Disp: 90 tablet, Rfl: 1 .  hydrochlorothiazide (HYDRODIURIL) 25 MG tablet, TAKE 1 TABLET EVERY DAY, Disp: 90 tablet, Rfl: 1 .  lisinopril (ZESTRIL) 10 MG tablet, TAKE 1 TABLET EVERY DAY, Disp: 90 tablet, Rfl: 1 .  Elastic Bandages & Supports (WRIST SPLINT/COCK-UP/LEFT M) MISC, Apply nightly (Patient not taking: Reported on 10/23/2020), Disp: 1 each, Rfl: 0 .  Elastic Bandages & Supports (WRIST SPLINT/COCK-UP/RIGHT M) MISC, Applied nightly (Patient not taking: Reported on 10/23/2020), Disp: 1 each, Rfl: 0 .  traZODone (DESYREL) 50 MG tablet, Take 0.5-1 tablets (25-50 mg  total) by mouth at bedtime as needed for sleep., Disp: 30 tablet, Rfl: 3  EXAM:  VITALS per patient if applicable:  GENERAL: alert, oriented, appears well and in no acute distress  HEENT: atraumatic, conjunttiva clear, no obvious abnormalities on inspection of external nose and ears  NECK: normal movements of the head and neck  LUNGS: on inspection no signs of respiratory distress, breathing rate appears normal, no obvious gross SOB, gasping or wheezing  CV: no obvious cyanosis  MS: moves all visible extremities without noticeable abnormality  PSYCH/NEURO: pleasant and cooperative, no obvious depression or anxiety, speech and thought processing grossly intact  ASSESSMENT AND PLAN:  Discussed the following assessment and plan:  Problem List Items Addressed This Visit    Essential hypertension    Undetermined control.  She will come in for labs and BP check.  She will continue amlodipine 5 mg once daily, HCTZ 25 mg daily, and lisinopril 10 mg daily.      History of breast cancer in female    Mammogram previously ordered.  She will call to get this scheduled.  We will refer her to general surgery to remove her port.      Relevant Orders   Ambulatory referral to General Surgery   Anxiety    I suspect this is contributing to her sleeping difficulty.  Her caffeine intake is also contributing.  Advised no caffeine after 1 PM.  We will try trazodone to see if that will help with her sleep as well as her anxiety.  She will monitor for drowsiness with this.      Relevant Medications   traZODone (DESYREL) 50 MG tablet      No follow-ups on file.   I discussed the assessment and treatment plan with the patient. The patient was provided an opportunity to ask questions and all were answered. The patient agreed with the plan and demonstrated an understanding of the instructions.   The patient was advised to call back or seek an in-person evaluation if the symptoms worsen or if the  condition fails to improve as anticipated.   Tommi Rumps, MD

## 2020-10-23 NOTE — Assessment & Plan Note (Signed)
Mammogram previously ordered.  She will call to get this scheduled.  We will refer her to general surgery to remove her port.

## 2020-11-10 DIAGNOSIS — Z95828 Presence of other vascular implants and grafts: Secondary | ICD-10-CM | POA: Diagnosis not present

## 2020-11-25 ENCOUNTER — Ambulatory Visit (INDEPENDENT_AMBULATORY_CARE_PROVIDER_SITE_OTHER): Payer: Medicare HMO

## 2020-11-25 VITALS — Ht 66.0 in | Wt 160.0 lb

## 2020-11-25 DIAGNOSIS — Z0000 Encounter for general adult medical examination without abnormal findings: Secondary | ICD-10-CM

## 2020-11-25 NOTE — Progress Notes (Signed)
Subjective:   Amanda Liu is a 65 y.o. female who presents for Medicare Annual (Subsequent) preventive examination.  Review of Systems    No ROS.  Medicare Wellness Virtual Visit.  Visual/audio telehealth visit, UTA vital signs.   See social history for additional risk factors.   Cardiac Risk Factors include: advanced age (>104mn, >>30women);hypertension     Objective:    Today's Vitals   11/25/20 1123  Weight: 160 lb (72.6 kg)  Height: '5\' 6"'  (1.676 m)   Body mass index is 25.82 kg/m.  Advanced Directives 11/25/2020 11/25/2019 01/16/2018 06/01/2017 05/01/2017 11/29/2016 06/28/2016  Does Patient Have a Medical Advance Directive? No No No No No No No  Does patient want to make changes to medical advance directive? - - - - No - Patient declined - -  Would patient like information on creating a medical advance directive? No - Patient declined No - Patient declined No - Patient declined No - Patient declined - - -    Current Medications (verified) Outpatient Encounter Medications as of 11/25/2020  Medication Sig   amLODipine (NORVASC) 5 MG tablet TAKE 1 TABLET EVERY DAY   Elastic Bandages & Supports (WRIST SPLINT/COCK-UP/LEFT M) MISC Apply nightly (Patient not taking: Reported on 10/23/2020)   Elastic Bandages & Supports (WRIST SPLINT/COCK-UP/RIGHT M) MISC Applied nightly (Patient not taking: Reported on 10/23/2020)   hydrochlorothiazide (HYDRODIURIL) 25 MG tablet TAKE 1 TABLET EVERY DAY   lisinopril (ZESTRIL) 10 MG tablet TAKE 1 TABLET EVERY DAY   traZODone (DESYREL) 50 MG tablet Take 0.5-1 tablets (25-50 mg total) by mouth at bedtime as needed for sleep.   No facility-administered encounter medications on file as of 11/25/2020.    Allergies (verified) Patient has no known allergies.   History: Past Medical History:  Diagnosis Date   Allergic rhinitis    [place] 02[place]   left breast lumpectomy, radiation and chemotherapy   Chickenpox    Elevated blood pressure     Fatigue due to depression 06/18/2015   Personal history of chemotherapy 08/2011   left breast ca   Personal history of radiation therapy 03/2012-05/2012   left breast ca   UTI (lower urinary tract infection)    Past Surgical History:  Procedure Laterality Date   ABDOMINAL HYSTERECTOMY     BREAST BIOPSY Left 07/2011   left breast invasive mammary carcinoma   BREAST LUMPECTOMY Left 03/16/2012   invasive mammary carcinoma: invasive ductal carcinoma. clear margins after reexcision. 5 LN positive for macrometastasis   TONSILLECTOMY     Family History  Problem Relation Age of Onset   Breast cancer Sister 472  Breast cancer Maternal Grandmother 845  Alcoholism Other        Parent   Alcohol abuse Father    Post-traumatic stress disorder Daughter        mental/emotional abuse   Eating disorder Daughter    Social History   Socioeconomic History   Marital status: Married    Spouse name: Not on file   Number of children: Not on file   Years of education: Not on file   Highest education level: Not on file  Occupational History   Not on file  Tobacco Use   Smoking status: Former    Pack years: 0.00   Smokeless tobacco: Never  Vaping Use   Vaping Use: Never used  Substance and Sexual Activity   Alcohol use: No    Alcohol/week: 0.0 standard drinks   Drug use: No  Sexual activity: Yes  Other Topics Concern   Not on file  Social History Narrative   Not on file   Social Determinants of Health   Financial Resource Strain: Low Risk    Difficulty of Paying Living Expenses: Not hard at all  Food Insecurity: No Food Insecurity   Worried About [place] in the Last Year: Never true   [place] in the Last Year: Never true  Transportation Needs: No Transportation Needs   Lack of Transportation (Medical): No   Lack of Transportation (Non-Medical): No  Physical Activity: Sufficiently Active   Days of Exercise per Week: 5 days   Minutes of Exercise per Session:  30 min  Stress: No Stress Concern Present   Feeling of Stress : Not at all  Social Connections: Unknown   Frequency of Communication with Friends and Family: Not on file   Frequency of Social Gatherings with Friends and Family: Not on file   Attends Religious Services: Not on Electrical engineer or Organizations: Not on file   Attends Archivist Meetings: Not on file   Marital Status: Married    Tobacco Counseling Counseling given: Not Answered   Clinical Intake:  Pre-visit preparation completed: Yes        Diabetes: No  How often do you need to have someone help you when you read instructions, pamphlets, or other written materials from your doctor or pharmacy?: 1 - Never  Interpreter Needed?: No      Activities of Daily Living In your present state of health, do you have any difficulty performing the following activities: 11/25/2020  Hearing? N  Vision? N  Difficulty concentrating or making decisions? N  Walking or climbing stairs? N  Dressing or bathing? N  Doing errands, shopping? N  Preparing Food and eating ? N  Using the Toilet? N  In the past six months, have you accidently leaked urine? N  Do you have problems with loss of bowel control? N  Managing your Medications? N  Managing your Finances? N  Housekeeping or managing your Housekeeping? N  Some recent data might be hidden    Patient Care Team: Leone Haven, MD as PCP - General (Family Medicine)  Indicate any recent Medical Services you may have received from other than [facility] providers in the past year (date may be approximate).     Assessment:   This is a routine wellness examination for Amanda Liu.  I connected with Amanda Liu today by telephone and verified that I am speaking with the correct person using two identifiers. Location patient: home Location provider: work Persons participating in the virtual visit: patient, Marine scientist.    I discussed the limitations, risks, security  and privacy concerns of performing an evaluation and management service by telephone and the availability of in person appointments. The patient expressed understanding and verbally consented to this telephonic visit.    Interactive audio and video telecommunications were attempted between this provider and patient, however failed, due to patient having technical difficulties OR patient did not have access to video capability.  We continued and completed visit with audio only.  Some vital signs may be absent or patient reported.   Hearing/Vision screen Hearing Screening - Comments:: Patient is able to hear conversational tones without difficulty.  No issues reported.  Dietary issues and exercise activities discussed: Current Exercise Habits: Home exercise routine Regular diet  Good water intake   Goals Addressed  This Visit's Progress    Maintain Healthy Lifestyle       Stay hydrated Walk for exercise Healthy diet        Depression Screen PHQ 2/9 Scores 11/25/2020 11/25/2019 01/28/2019 06/06/2018 05/01/2017 04/29/2016  PHQ - 2 Score 0 0 0 0 0 0  PHQ- 9 Score - - - - 0 -    Fall Risk Fall Risk  11/25/2020 11/25/2019 01/28/2019 05/01/2017 04/29/2016  Falls in the past year? 0 0 0 No No  Number falls in past yr: 0 0 0 - -  Injury with Fall? 0 - - - -  Follow up Falls evaluation completed Falls evaluation completed Falls evaluation completed - -    FALL RISK PREVENTION PERTAINING TO THE HOME: Handrails in use when climbing stairs? Yes Home free of loose throw rugs in walkways, pet beds, electrical cords, etc? Yes  Adequate lighting in your home to reduce risk of falls? Yes   ASSISTIVE DEVICES UTILIZED TO PREVENT FALLS:  Life alert? No  Use of a cane, walker or w/c? No   TIMED UP AND GO: Was the test performed? No .   Cognitive Function: Patient is alert and oriented x3.  Denies difficulty focusing, making decisions, memory loss.  MMSE/6CIT deferred. Normal by  direct communication/observation.  MMSE - Mini Mental State Exam 11/25/2019 05/01/2017  Not completed: Unable to complete -  Orientation to time - 5  Orientation to Place - 5  Registration - 3  Attention/ Calculation - 5  Recall - 3  Language- name 2 objects - 2  Language- repeat - 1  Language- follow 3 step command - 3  Language- read & follow direction - 1  Write a sentence - 1  Copy design - 1  Total score - 30     6CIT Screen 04/29/2016  What Year? 0 points  What month? 0 points  What time? 0 points  Count back from 20 0 points  Months in reverse 0 points  Repeat phrase 0 points  Total Score 0    Immunizations Immunization History  Administered Date(s) Administered   Influenza,inj,Quad PF,6+ Mos 02/25/2015, 03/18/2016, 05/01/2017, 02/20/2018    TDAP status: Due, Education has been provided regarding the importance of this vaccine. Advised may receive this vaccine at local pharmacy or Health Dept. Aware to provide a copy of the vaccination record if obtained from local pharmacy or Health Dept. Verbalized acceptance and understanding. Deferred.   PNA vaccine- deferred per patient.   Shingles vaccine- deferred per patient.   Health Maintenance Health Maintenance  Topic Date Due   HIV Screening  Never done   Fecal DNA (Cologuard)  Never done   Zoster Vaccines- Shingrix (1 of 2) 02/25/2021 (Originally 07/19/1974)   TETANUS/TDAP  11/25/2021 (Originally 07/19/1974)   PNA vac Low Risk Adult (1 of 2 - PCV13) 11/25/2021 (Originally 07/19/2020)   INFLUENZA VACCINE  12/28/2020   MAMMOGRAM  04/01/2021   DEXA SCAN  Completed   Hepatitis C Screening  Completed   HPV VACCINES  Aged Out   PAP SMEAR-Modifier  Discontinued   COVID-19 Vaccine  Discontinued   Cologuard- received kit. Plans to complete.   Mammogram- previously ordered. Plans to schedule before 12/2020.   PNA vaccine- deferred per patient.   Lung Cancer Screening: (Low Dose CT Chest recommended if Age 69-80 years,  30 pack-year currently smoking OR have quit w/in 15years.) does not qualify.   Dental Screening: Recommended annual dental exams for proper oral hygiene.  [employer]  Referral / Chronic Care Management: CRR required this visit?  No   CCM required this visit?  No      Plan:   Keep all routine maintenance appointments.   I have personally reviewed and noted the following in the patient's chart:   Medical and social history Use of alcohol, tobacco or illicit drugs  Current medications and supplements including opioid prescriptions. Patient is not currently taking opioid.  Functional ability and status Nutritional status Physical activity Advanced directives List of other physicians Hospitalizations, surgeries, and ER visits in previous 12 months Vitals Screenings to include cognitive, depression, and falls Referrals and appointments  In addition, I have reviewed and discussed with patient certain preventive protocols, quality metrics, and best practice recommendations. A written personalized care plan for preventive services as well as general preventive health recommendations were provided to patient via mychart.     Varney Biles, LPN   [id number]

## 2020-11-25 NOTE — Patient Instructions (Addendum)
Amanda Liu , Thank you for taking time to come for your Medicare Wellness Visit. I appreciate your ongoing commitment to your health goals. Please review the following plan we discussed and let me know if I can assist you in the future.   These are the goals we discussed:  Goals       Patient Stated     Increase physical activity (pt-stated)      Other     Maintain Healthy Lifestyle      Stay hydrated Walk for exercise Healthy diet         This is a list of the screening recommended for you and due dates:  Health Maintenance  Topic Date Due   HIV Screening  Never done   Cologuard (Stool DNA test)  Never done   Zoster (Shingles) Vaccine (1 of 2) 02/25/2021*   Tetanus Vaccine  11/25/2021*   Pneumonia vaccines (1 of 2 - PCV13) 11/25/2021*   Flu Shot  12/28/2020   Mammogram  04/01/2021   DEXA scan (bone density measurement)  Completed   Hepatitis C Screening: USPSTF Recommendation to screen - Ages 29-79 yo.  Completed   HPV Vaccine  Aged Out   Pap Smear  Discontinued   COVID-19 Vaccine  Discontinued  *Topic was postponed. The date shown is not the original due date.    Advanced directives: not yet completed   Conditions/risks identified: none new  Follow up in one year for your annual wellness visit    Preventive Care 65 Years and Older, Female Preventive care refers to lifestyle choices and visits with your health care provider that can promote health and wellness. What does preventive care include? A yearly physical exam. This is also called an annual well check. Dental exams once or twice a year. Routine eye exams. Ask your health care provider how often you should have your eyes checked. Personal lifestyle choices, including: Daily care of your teeth and gums. Regular physical activity. Eating a healthy diet. Avoiding tobacco and drug use. Limiting alcohol use. Practicing safe sex. Taking low-dose aspirin every day. Taking vitamin and mineral supplements as  recommended by your health care provider. What happens during an annual well check? The services and screenings done by your health care provider during your annual well check will depend on your age, overall health, lifestyle risk factors, and family history of disease. Counseling  Your health care provider may ask you questions about your: Alcohol use. Tobacco use. Drug use. Emotional well-being. Home and relationship well-being. Sexual activity. Eating habits. History of falls. Memory and ability to understand (cognition). Work and work Statistician. Reproductive health. Screening  You may have the following tests or measurements: Height, weight, and BMI. Blood pressure. Lipid and cholesterol levels. These may be checked every 5 years, or more frequently if you are over 33 years old. Skin check. Lung cancer screening. You may have this screening every year starting at age 63 if you have a 30-pack-year history of smoking and currently smoke or have quit within the past 15 years. Fecal occult blood test (FOBT) of the stool. You may have this test every year starting at age 21. Flexible sigmoidoscopy or colonoscopy. You may have a sigmoidoscopy every 5 years or a colonoscopy every 10 years starting at age 29. Hepatitis C blood test. Hepatitis B blood test. Sexually transmitted disease (STD) testing. Diabetes screening. This is done by checking your blood sugar (glucose) after you have not eaten for a while (fasting). You may have  this done every 1-3 years. Bone density scan. This is done to screen for osteoporosis. You may have this done starting at age 19. Mammogram. This may be done every 1-2 years. Talk to your health care provider about how often you should have regular mammograms. Talk with your health care provider about your test results, treatment options, and if necessary, the need for more tests. Vaccines  Your health care provider may recommend certain vaccines, such  as: Influenza vaccine. This is recommended every year. Tetanus, diphtheria, and acellular pertussis (Tdap, Td) vaccine. You may need a Td booster every 10 years. Zoster vaccine. You may need this after age 61. Pneumococcal 13-valent conjugate (PCV13) vaccine. One dose is recommended after age 16. Pneumococcal polysaccharide (PPSV23) vaccine. One dose is recommended after age 8. Talk to your health care provider about which screenings and vaccines you need and how often you need them. This information is not intended to replace advice given to you by your health care provider. Make sure you discuss any questions you have with your health care provider. Document Released: 06/12/2015 Document Revised: 02/03/2016 Document Reviewed: 03/17/2015 Elsevier Interactive Patient Education  2017 [place] Prevention in the Home Falls can cause injuries. They can happen to people of all ages. There are many things you can do to make your home safe and to help prevent falls. What can I do on the outside of my home? Regularly fix the edges of walkways and driveways and fix any cracks. Remove anything that might make you trip as you walk through a door, such as a raised step or threshold. Trim any bushes or trees on the path to your home. Use bright outdoor lighting. Clear any walking paths of anything that might make someone trip, such as rocks or tools. Regularly check to see if handrails are loose or broken. Make sure that both sides of any steps have handrails. Any raised decks and porches should have guardrails on the edges. Have any leaves, snow, or ice cleared regularly. Use sand or salt on walking paths during winter. Clean up any spills in your garage right away. This includes oil or grease spills. What can I do in the bathroom? Use night lights. Install grab bars by the toilet and in the tub and shower. Do not use towel bars as grab bars. Use non-skid mats or decals in the tub or  shower. If you need to sit down in the shower, use a plastic, non-slip stool. Keep the floor dry. Clean up any water that spills on the floor as soon as it happens. Remove soap buildup in the tub or shower regularly. Attach bath mats securely with double-sided non-slip rug tape. Do not have throw rugs and other things on the floor that can make you trip. What can I do in the bedroom? Use night lights. Make sure that you have a light by your bed that is easy to reach. Do not use any sheets or blankets that are too big for your bed. They should not hang down onto the floor. Have a firm chair that has side arms. You can use this for support while you get dressed. Do not have throw rugs and other things on the floor that can make you trip. What can I do in the kitchen? Clean up any spills right away. Avoid walking on wet floors. Keep items that you use a lot in easy-to-reach places. If you need to reach something above you, use a strong step stool  that has a grab bar. Keep electrical cords out of the way. Do not use floor polish or wax that makes floors slippery. If you must use wax, use non-skid floor wax. Do not have throw rugs and other things on the floor that can make you trip. What can I do with my stairs? Do not leave any items on the stairs. Make sure that there are handrails on both sides of the stairs and use them. Fix handrails that are broken or loose. Make sure that handrails are as long as the stairways. Check any carpeting to make sure that it is firmly attached to the stairs. Fix any carpet that is loose or worn. Avoid having throw rugs at the top or bottom of the stairs. If you do have throw rugs, attach them to the floor with carpet tape. Make sure that you have a light switch at the top of the stairs and the bottom of the stairs. If you do not have them, ask someone to add them for you. What else can I do to help prevent falls? Wear shoes that: Do not have high heels. Have  rubber bottoms. Are comfortable and fit you well. Are closed at the toe. Do not wear sandals. If you use a stepladder: Make sure that it is fully opened. Do not climb a closed stepladder. Make sure that both sides of the stepladder are locked into place. Ask someone to hold it for you, if possible. Clearly mark and make sure that you can see: Any grab bars or handrails. First and last steps. Where the edge of each step is. Use tools that help you move around (mobility aids) if they are needed. These include: Canes. Walkers. Scooters. Crutches. Turn on the lights when you go into a dark area. Replace any light bulbs as soon as they burn out. Set up your furniture so you have a clear path. Avoid moving your furniture around. If any of your floors are uneven, fix them. If there are any pets around you, be aware of where they are. Review your medicines with your doctor. Some medicines can make you feel dizzy. This can increase your chance of falling. Ask your doctor what other things that you can do to help prevent falls. This information is not intended to replace advice given to you by your health care provider. Make sure you discuss any questions you have with your health care provider. Document Released: 03/12/2009 Document Revised: 10/22/2015 Document Reviewed: 06/20/2014 Elsevier Interactive Patient Education  2017 [employer].

## 2021-01-11 ENCOUNTER — Other Ambulatory Visit: Payer: Self-pay | Admitting: Family Medicine

## 2021-01-11 DIAGNOSIS — I10 Essential (primary) hypertension: Secondary | ICD-10-CM

## 2021-01-17 ENCOUNTER — Telehealth: Payer: Self-pay | Admitting: Family Medicine

## 2021-01-17 DIAGNOSIS — Z1231 Encounter for screening mammogram for malignant neoplasm of breast: Secondary | ICD-10-CM

## 2021-01-17 NOTE — Telephone Encounter (Signed)
I received a reminder from the record system that the patient never called to schedule her mammogram.  She does need this to be scheduled.  I placed another order.  She can call [phone] to get this scheduled.

## 2021-01-18 NOTE — Telephone Encounter (Signed)
LVM for patient to call back.   Saina Waage,cma  

## 2021-01-21 NOTE — Telephone Encounter (Signed)
Noted  

## 2021-02-23 ENCOUNTER — Other Ambulatory Visit: Payer: Self-pay

## 2021-02-23 ENCOUNTER — Ambulatory Visit
Admission: RE | Admit: 2021-02-23 | Discharge: 2021-02-23 | Disposition: A | Discharge status: Home / Self Care | Payer: Medicare HMO | Source: Ambulatory Visit | Attending: Family Medicine | Admitting: Family Medicine

## 2021-02-23 DIAGNOSIS — Z1231 Encounter for screening mammogram for malignant neoplasm of breast: Secondary | ICD-10-CM

## 2021-02-27 ENCOUNTER — Other Ambulatory Visit: Payer: Self-pay | Admitting: Family Medicine

## 2021-02-27 DIAGNOSIS — F419 Anxiety disorder, unspecified: Secondary | ICD-10-CM

## 2021-09-21 ENCOUNTER — Other Ambulatory Visit: Payer: Self-pay | Admitting: Family Medicine

## 2021-09-21 DIAGNOSIS — I10 Essential (primary) hypertension: Secondary | ICD-10-CM

## 2021-10-18 ENCOUNTER — Ambulatory Visit (INDEPENDENT_AMBULATORY_CARE_PROVIDER_SITE_OTHER): Payer: Medicare HMO | Admitting: Family Medicine

## 2021-10-18 ENCOUNTER — Encounter: Payer: Self-pay | Admitting: Family Medicine

## 2021-10-18 VITALS — BP 120/80 | HR 72 | Temp 97.9°F | Ht 65.5 in | Wt 178.1 lb

## 2021-10-18 DIAGNOSIS — M25561 Pain in right knee: Secondary | ICD-10-CM | POA: Diagnosis not present

## 2021-10-18 DIAGNOSIS — M25562 Pain in left knee: Secondary | ICD-10-CM | POA: Diagnosis not present

## 2021-10-18 DIAGNOSIS — Z853 Personal history of malignant neoplasm of breast: Secondary | ICD-10-CM

## 2021-10-18 DIAGNOSIS — G8929 Other chronic pain: Secondary | ICD-10-CM | POA: Diagnosis not present

## 2021-10-18 DIAGNOSIS — Z780 Asymptomatic menopausal state: Secondary | ICD-10-CM | POA: Diagnosis not present

## 2021-10-18 DIAGNOSIS — R7303 Prediabetes: Secondary | ICD-10-CM

## 2021-10-18 DIAGNOSIS — I10 Essential (primary) hypertension: Secondary | ICD-10-CM

## 2021-10-18 DIAGNOSIS — W19XXXA Unspecified fall, initial encounter: Secondary | ICD-10-CM | POA: Diagnosis not present

## 2021-10-18 DIAGNOSIS — Z1211 Encounter for screening for malignant neoplasm of colon: Secondary | ICD-10-CM

## 2021-10-18 LAB — COMPREHENSIVE METABOLIC PANEL
ALT: 15 U/L (ref 0–35)
AST: 18 U/L (ref 0–37)
Albumin: 4.7 g/dL (ref 3.5–5.2)
Alkaline Phosphatase: 47 U/L (ref 39–117)
BUN: 9 mg/dL (ref 6–23)
CO2: 29 mEq/L (ref 19–32)
Calcium: 9.8 mg/dL (ref 8.4–10.5)
Chloride: 99 mEq/L (ref 96–112)
Creatinine, Ser: 0.64 mg/dL (ref 0.40–1.20)
GFR: 92.2 mL/min (ref >60.00)
Glucose, Bld: 101 mg/dL — ABNORMAL HIGH (ref 70–99)
Potassium: 3.9 mEq/L (ref 3.5–5.1)
Sodium: 138 mEq/L (ref 135–145)
Total Bilirubin: 0.6 mg/dL (ref 0.2–1.2)
Total Protein: 7 g/dL (ref 6.0–8.3)

## 2021-10-18 LAB — LIPID PANEL
Cholesterol: 202 mg/dL — ABNORMAL HIGH (ref 0–200)
HDL: 63.4 mg/dL (ref >39.00)
LDL Cholesterol: 116 mg/dL — ABNORMAL HIGH (ref 0–99)
NonHDL: 138.55
Total CHOL/HDL Ratio: 3
Triglycerides: 111 mg/dL (ref 0.0–149.0)
VLDL: 22.2 mg/dL (ref 0.0–40.0)

## 2021-10-18 LAB — HEMOGLOBIN A1C: Hgb A1c MFr Bld: 6 % (ref 4.6–6.5)

## 2021-10-18 NOTE — Assessment & Plan Note (Signed)
Likely osteoarthritis.  Discussed that she could continue Voltaren gel over-the-counter and Tylenol over-the-counter.  Discussed other likely the lowest risk options for treatment.  Discussed minimal systemic absorption of the Voltaren gel.

## 2021-10-18 NOTE — Assessment & Plan Note (Signed)
Mammogram is up-to-date.  Breast exam completed today.  Discussed yearly mammogram and breast exams.

## 2021-10-18 NOTE — Assessment & Plan Note (Signed)
Patient reports a single mechanical fall almost a year ago.  She notes no persistent issues related to that follow-up.  I encouraged her to contact us if she has any additional falls or if she hits her head in the future.

## 2021-10-18 NOTE — Assessment & Plan Note (Signed)
Check A1c. 

## 2021-10-18 NOTE — Progress Notes (Signed)
Amanda Rumps, MD Phone: [phone]  Amanda Liu is a 66 y.o. female who presents today for f/u.  HYPERTENSION Disease Monitoring Home BP Monitoring not checking Chest pain- no    Dyspnea- no Medications Compliance-  taking amlodipine, lisinopril, HCTZ.   Edema- no BMET    Component Value Date/Time   NA 135 03/04/2020 0807   NA 134 (L) 08/27/2014 1127   K 3.7 03/04/2020 0807   K 3.3 (L) 08/27/2014 1127   CL 96 03/04/2020 0807   CL 101 08/27/2014 1127   CO2 31 03/04/2020 0807   CO2 25 08/27/2014 1127   GLUCOSE 104 (H) 03/04/2020 0807   GLUCOSE 140 (H) 08/27/2014 1127   BUN 8 03/04/2020 0807   BUN 13 08/27/2014 1127   CREATININE 0.62 03/04/2020 0807   CREATININE 0.58 08/27/2014 1127   CALCIUM 9.5 03/04/2020 0807   CALCIUM 9.4 08/27/2014 1127   GFRNONAA >60 06/01/2017 0848   GFRNONAA >60 08/27/2014 1127   GFRAA >60 06/01/2017 0848   GFRAA >60 08/27/2014 1127   Bilateral knee pain: Patient notes this has been going on for a while.  Hurts more if she sits for a while or if it is going to rain.  Also hurts if she has been on them for long periods of time.  No pain in the morning.  Voltaren gel once daily is beneficial.  Tylenol is also beneficial.  History of breast cancer: Patient had her port removed last year.  Her mammogram was completed in September 2022.  She is due for a yearly breast exam.  Fall: Patient notes she fell last summer.  She tripped on something that was in a place it should have been.  She notes she did hit her head.  She did not get evaluated.  She has had no issues since then.  No headaches.  Social History   Tobacco Use  Smoking Status Former  Smokeless Tobacco Never    Current Outpatient Medications on File Prior to Visit  Medication Sig Dispense Refill   amLODipine (NORVASC) 5 MG tablet TAKE 1 TABLET EVERY DAY 30 tablet 1   hydrochlorothiazide (HYDRODIURIL) 25 MG tablet TAKE 1 TABLET EVERY DAY 30 tablet 1   lisinopril (ZESTRIL) 10 MG  tablet TAKE 1 TABLET EVERY DAY 30 tablet 1   Elastic Bandages & Supports (WRIST SPLINT/COCK-UP/LEFT M) MISC Apply nightly (Patient not taking: Reported on 10/18/2021) 1 each 0   Elastic Bandages & Supports (WRIST SPLINT/COCK-UP/RIGHT M) MISC Applied nightly (Patient not taking: Reported on 10/18/2021) 1 each 0   traZODone (DESYREL) 50 MG tablet TAKE 1/2 TO 1 TABLET(25 TO 50 MG) BY MOUTH AT BEDTIME AS NEEDED FOR SLEEP (Patient not taking: Reported on 10/18/2021) 30 tablet 3   No current facility-administered medications on file prior to visit.     ROS see history of present illness  Objective  Physical Exam Vitals:   10/18/21 0804  BP: 120/80  Pulse: 72  Temp: 97.9 F (36.6 C)  SpO2: 98%    BP Readings from Last 3 Encounters:  10/18/21 120/80  06/06/18 102/78  03/01/18 116/78   Wt Readings from Last 3 Encounters:  10/18/21 178 lb 0.8 oz (80.8 kg)  11/25/20 160 lb (72.6 kg)  10/23/20 160 lb (72.6 kg)    Physical Exam Constitutional:      General: She is not in acute distress.    Appearance: She is not diaphoretic.  Cardiovascular:     Rate and Rhythm: Normal rate and regular rhythm.  Heart sounds: Normal heart sounds.  Pulmonary:     Effort: Pulmonary effort is normal.     Breath sounds: Normal breath sounds.  Chest:       Comments: Scar noted as outlined above in left breast, otherwise no skin changes, nipple inversion, masses, or tenderness in either breast, no axillary masses bilaterally, Amanda Liu, CMA served as chaperone Musculoskeletal:     Comments: Bilateral knees with no swelling, warmth, erythema, or tenderness.  Skin:    General: Skin is warm and dry.  Neurological:     Mental Status: She is alert.     Assessment/Plan: Please see individual problem list.  Problem List Items Addressed This Visit     Bilateral knee pain (Chronic)    Likely osteoarthritis.  Discussed that she could continue Voltaren gel over-the-counter and Tylenol  over-the-counter.  Discussed other likely the lowest risk options for treatment.  Discussed minimal systemic absorption of the Voltaren gel.       Essential hypertension - Primary (Chronic)    Adequately controlled.  She will continue amlodipine 5 mg once daily, HCTZ 25 mg daily, and lisinopril 10 mg daily.  Check lab work.       Relevant Orders   HgB A1c   Comp Met (CMET)   Lipid panel   History of breast cancer in female (Chronic)    Mammogram is up-to-date.  Breast exam completed today.  Discussed yearly mammogram and breast exams.       Relevant Orders   MM 3D SCREEN BREAST BILATERAL   Prediabetes (Chronic)    Check A1c.       Relevant Orders   HgB A1c   Fall    Patient reports a single mechanical fall almost a year ago.  She notes no persistent issues related to that follow-up.  I encouraged her to contact us if she has any additional falls or if she hits her head in the future.       Other Visit Diagnoses     Colon cancer screening       Relevant Orders   Ambulatory referral to Gastroenterology   Postmenopausal estrogen deficiency       Relevant Orders   DG Bone Density      Health maintenance: Patient will be referred to GI for colonoscopy.  She reports one of her grandparents had colon cancer so Cologuard is not an option.  She will call to schedule her mammogram and bone density scan closer to the time that the mammogram is due in September.  Return in about 6 months (around 04/20/2022) for Hypertension follow-up.   Amanda Rumps, MD [place]

## 2021-10-18 NOTE — Assessment & Plan Note (Signed)
Adequately controlled.  She will continue amlodipine 5 mg once daily, HCTZ 25 mg daily, and lisinopril 10 mg daily.  Check lab work.

## 2021-10-18 NOTE — Patient Instructions (Signed)
Nice to see you. GI will contact you to schedule colonoscopy. Please call [phone] to schedule your mammogram and bone density scan. We will contact you with your lab results.

## 2021-10-19 ENCOUNTER — Other Ambulatory Visit: Payer: Self-pay | Admitting: Family Medicine

## 2021-10-19 DIAGNOSIS — E785 Hyperlipidemia, unspecified: Secondary | ICD-10-CM

## 2021-10-19 MED ORDER — ROSUVASTATIN CALCIUM 20 MG PO TABS: 20.0000 mg | ORAL_TABLET | Freq: Every day | ORAL | 3 refills | Status: DC

## 2021-10-20 ENCOUNTER — Other Ambulatory Visit: Payer: Self-pay

## 2021-10-20 DIAGNOSIS — Z1211 Encounter for screening for malignant neoplasm of colon: Secondary | ICD-10-CM

## 2021-10-20 MED ORDER — PEG 3350-KCL-NA BICARB-NACL 420 G PO SOLR: 4000.0000 mL | Freq: Once | ORAL | 0 refills | Status: AC

## 2021-10-20 NOTE — Progress Notes (Signed)
Gastroenterology Pre-Procedure Review Patient to send in email with insurance info Request Date: 12/06/2021 Requesting Physician: Dr. Vicente Males   PATIENT REVIEW QUESTIONS: The patient responded to the following health history questions as indicated:    1. Are you having any GI issues? no 2. Do you have a personal history of Polyps? no 3. Do you have a family history of Colon Cancer or Polyps? no 4. Diabetes Mellitus? no 5. Joint replacements in the past 12 months?no 6. Major health problems in the past 3 months?no 7. Any artificial heart valves, MVP, or defibrillator?no    MEDICATIONS & ALLERGIES:    Patient reports the following regarding taking any anticoagulation/antiplatelet therapy:   Plavix, Coumadin, Eliquis, Xarelto, Lovenox, Pradaxa, Brilinta, or Effient? no Aspirin? no  Patient confirms/reports the following medications:  Current Outpatient Medications  Medication Sig Dispense Refill   amLODipine (NORVASC) 5 MG tablet TAKE 1 TABLET EVERY DAY 30 tablet 1   Elastic Bandages & Supports (WRIST SPLINT/COCK-UP/LEFT M) MISC Apply nightly (Patient not taking: Reported on 10/18/2021) 1 each 0   Elastic Bandages & Supports (WRIST SPLINT/COCK-UP/RIGHT M) MISC Applied nightly (Patient not taking: Reported on 10/18/2021) 1 each 0   hydrochlorothiazide (HYDRODIURIL) 25 MG tablet TAKE 1 TABLET EVERY DAY 30 tablet 1   lisinopril (ZESTRIL) 10 MG tablet TAKE 1 TABLET EVERY DAY 30 tablet 1   rosuvastatin (CRESTOR) 20 MG tablet Take 1 tablet (20 mg total) by mouth daily. 90 tablet 3   traZODone (DESYREL) 50 MG tablet TAKE 1/2 TO 1 TABLET(25 TO 50 MG) BY MOUTH AT BEDTIME AS NEEDED FOR SLEEP (Patient not taking: Reported on 10/18/2021) 30 tablet 3   No current facility-administered medications for this visit.    Patient confirms/reports the following allergies:  No Known Allergies  No orders of the defined types were placed in this encounter.   AUTHORIZATION INFORMATION Primary  Insurance: 1D#: Group #:  Secondary Insurance: 1D#: Group #:  SCHEDULE INFORMATION: Date: 12/06/2021 Time: Location:[facility]

## 2021-11-15 ENCOUNTER — Encounter: Payer: Self-pay | Admitting: Family Medicine

## 2021-11-16 NOTE — Telephone Encounter (Signed)
She can push it out another 4 weeks after that and we can cancel it if needed.

## 2021-11-29 ENCOUNTER — Ambulatory Visit: Payer: Medicare HMO

## 2021-11-29 ENCOUNTER — Telehealth: Payer: Self-pay

## 2021-11-29 ENCOUNTER — Other Ambulatory Visit: Payer: Medicare HMO

## 2021-11-29 NOTE — Telephone Encounter (Signed)
Pt lmovm requesting to reschedule her procedure due to a family issue

## 2021-11-29 NOTE — Telephone Encounter (Signed)
No answer when called for scheduled AWV. Unable to leave a message. Okay to reschedule appointment.

## 2021-12-02 NOTE — Telephone Encounter (Signed)
Called patient back and she agreed on getting her procedure rescheduled for 01/19/2022. Endoscopy unit was contacted and provided the new date.

## 2021-12-06 NOTE — Telephone Encounter (Signed)
Pt scheduled for lab in 6 weeks.

## 2021-12-18 ENCOUNTER — Other Ambulatory Visit: Payer: Self-pay | Admitting: Family Medicine

## 2021-12-18 DIAGNOSIS — I10 Essential (primary) hypertension: Secondary | ICD-10-CM

## 2022-01-03 ENCOUNTER — Other Ambulatory Visit: Payer: Medicare HMO

## 2022-01-17 ENCOUNTER — Telehealth: Payer: Self-pay

## 2022-01-17 NOTE — Telephone Encounter (Signed)
Patient has requested to cancel her colonoscopy 01/19/22 with Dr. Vicente Males due to family household exposure to [place].  She said she will call back at a later time to reschedule.  Thanks, [place], [place]

## 2022-01-19 ENCOUNTER — Encounter: Admission: RE | Payer: Self-pay | Source: Home / Self Care

## 2022-01-19 ENCOUNTER — Ambulatory Visit: Admission: RE | Admit: 2022-01-19 | Payer: Medicare HMO | Source: Home / Self Care | Admitting: Gastroenterology

## 2022-01-19 SURGERY — COLONOSCOPY WITH PROPOFOL
Anesthesia: General

## 2022-01-21 ENCOUNTER — Other Ambulatory Visit (INDEPENDENT_AMBULATORY_CARE_PROVIDER_SITE_OTHER): Payer: Medicare HMO

## 2022-01-21 DIAGNOSIS — E785 Hyperlipidemia, unspecified: Secondary | ICD-10-CM

## 2022-01-21 LAB — HEPATIC FUNCTION PANEL
ALT: 14 U/L (ref 0–35)
AST: 17 U/L (ref 0–37)
Albumin: 4.7 g/dL (ref 3.5–5.2)
Alkaline Phosphatase: 41 U/L (ref 39–117)
Bilirubin, Direct: 0.1 mg/dL (ref 0.0–0.3)
Total Bilirubin: 0.5 mg/dL (ref 0.2–1.2)
Total Protein: 6.9 g/dL (ref 6.0–8.3)

## 2022-01-21 LAB — LDL CHOLESTEROL, DIRECT: Direct LDL: 46 mg/dL

## 2022-01-24 ENCOUNTER — Telehealth: Payer: Self-pay | Admitting: Family Medicine

## 2022-01-24 NOTE — Telephone Encounter (Signed)
Copied from [place] [id number]. Topic: Medicare AWV >> Jan 24, 2022  2:42 PM Devoria Glassing wrote: Reason for CRM: Left message for patient to schedule Annual Wellness Visit.  Please schedule with Nurse Health Advisor Denisa O'Brien-Blaney, LPN at [facility]. This appt can be telephone or office visit.  Please call [phone] ask for [facility]

## 2022-02-02 ENCOUNTER — Telehealth: Payer: Self-pay | Admitting: Family Medicine

## 2022-02-02 NOTE — Telephone Encounter (Signed)
Copied from [place] [phone]. Topic: Medicare AWV >> Feb 02, 2022 10:45 AM Devoria Glassing wrote: Reason for CRM: Left message for patient to schedule Annual Wellness Visit.  Please schedule with Nurse Health Advisor Denisa O'Brien-Blaney, LPN at [facility]. This appt can be telephone or office visit.  Please call [phone] ask for [facility]

## 2022-03-09 ENCOUNTER — Ambulatory Visit (INDEPENDENT_AMBULATORY_CARE_PROVIDER_SITE_OTHER): Payer: Medicare HMO

## 2022-03-09 VITALS — Ht 65.5 in | Wt 178.0 lb

## 2022-03-09 DIAGNOSIS — Z0000 Encounter for general adult medical examination without abnormal findings: Secondary | ICD-10-CM | POA: Diagnosis not present

## 2022-03-09 NOTE — Patient Instructions (Addendum)
Amanda Liu , Thank you for taking time to come for your Medicare Wellness Visit. I appreciate your ongoing commitment to your health goals. Please review the following plan we discussed and let me know if I can assist you in the future.   These are the goals we discussed:  Goals       Patient Stated     Increase physical activity (pt-stated)      Other     Maintain Healthy Lifestyle      Stay hydrated Stay active Healthy diet        This is a list of the screening recommended for you and due dates:  Health Maintenance  Topic Date Due   Colon Cancer Screening  04/29/2022*   Zoster (Shingles) Vaccine (1 of 2) 06/09/2022*   Flu Shot  08/28/2022*   Pneumonia Vaccine (1 - PCV) 03/10/2023*   Tetanus Vaccine  03/10/2023*   Mammogram  02/24/2023   DEXA scan (bone density measurement)  Completed   Hepatitis C Screening: USPSTF Recommendation to screen - Ages 80-79 yo.  Completed   HPV Vaccine  Aged Out   COVID-19 Vaccine  Discontinued  *Topic was postponed. The date shown is not the original due date.   Call and reschedule colonoscopy and mammogram.   Advanced directives: declined.   Conditions/risks identified: none new.   Next appointment: Follow up in one year for your annual wellness visit    Preventive Care 65 Years and Older, Female Preventive care refers to lifestyle choices and visits with your health care provider that can promote health and wellness. What does preventive care include? A yearly physical exam. This is also called an annual well check. Dental exams once or twice a year. Routine eye exams. Ask your health care provider how often you should have your eyes checked. Personal lifestyle choices, including: Daily care of your teeth and gums. Regular physical activity. Eating a healthy diet. Avoiding tobacco and drug use. Limiting alcohol use. Practicing safe sex. Taking low-dose aspirin every day. Taking vitamin and mineral supplements as recommended  by your health care provider. What happens during an annual well check? The services and screenings done by your health care provider during your annual well check will depend on your age, overall health, lifestyle risk factors, and family history of disease. Counseling  Your health care provider may ask you questions about your: Alcohol use. Tobacco use. Drug use. Emotional well-being. Home and relationship well-being. Sexual activity. Eating habits. History of falls. Memory and ability to understand (cognition). Work and work Statistician. Reproductive health. Screening  You may have the following tests or measurements: Height, weight, and BMI. Blood pressure. Lipid and cholesterol levels. These may be checked every 5 years, or more frequently if you are over 73 years old. Skin check. Lung cancer screening. You may have this screening every year starting at age 25 if you have a 30-pack-year history of smoking and currently smoke or have quit within the past 15 years. Fecal occult blood test (FOBT) of the stool. You may have this test every year starting at age 27. Flexible sigmoidoscopy or colonoscopy. You may have a sigmoidoscopy every 5 years or a colonoscopy every 10 years starting at age 65. Hepatitis C blood test. Hepatitis B blood test. Sexually transmitted disease (STD) testing. Diabetes screening. This is done by checking your blood sugar (glucose) after you have not eaten for a while (fasting). You may have this done every 1-3 years. Bone density scan. This is done  to screen for osteoporosis. You may have this done starting at age 51. Mammogram. This may be done every 1-2 years. Talk to your health care provider about how often you should have regular mammograms. Talk with your health care provider about your test results, treatment options, and if necessary, the need for more tests. Vaccines  Your health care provider may recommend certain vaccines, such as: Influenza  vaccine. This is recommended every year. Tetanus, diphtheria, and acellular pertussis (Tdap, Td) vaccine. You may need a Td booster every 10 years. Zoster vaccine. You may need this after age 49. Pneumococcal 13-valent conjugate (PCV13) vaccine. One dose is recommended after age 53. Pneumococcal polysaccharide (PPSV23) vaccine. One dose is recommended after age 85. Talk to your health care provider about which screenings and vaccines you need and how often you need them. This information is not intended to replace advice given to you by your health care provider. Make sure you discuss any questions you have with your health care provider. Document Released: 06/12/2015 Document Revised: 02/03/2016 Document Reviewed: 03/17/2015 Elsevier Interactive Patient Education  2017 [employer] Prevention in the Home Falls can cause injuries. They can happen to people of all ages. There are many things you can do to make your home safe and to help prevent falls. What can I do on the outside of my home? Regularly fix the edges of walkways and driveways and fix any cracks. Remove anything that might make you trip as you walk through a door, such as a raised step or threshold. Trim any bushes or trees on the path to your home. Use bright outdoor lighting. Clear any walking paths of anything that might make someone trip, such as rocks or tools. Regularly check to see if handrails are loose or broken. Make sure that both sides of any steps have handrails. Any raised decks and porches should have guardrails on the edges. Have any leaves, snow, or ice cleared regularly. Use sand or salt on walking paths during winter. Clean up any spills in your garage right away. This includes oil or grease spills. What can I do in the bathroom? Use night lights. Install grab bars by the toilet and in the tub and shower. Do not use towel bars as grab bars. Use non-skid mats or decals in the tub or shower. If you  need to sit down in the shower, use a plastic, non-slip stool. Keep the floor dry. Clean up any water that spills on the floor as soon as it happens. Remove soap buildup in the tub or shower regularly. Attach bath mats securely with double-sided non-slip rug tape. Do not have throw rugs and other things on the floor that can make you trip. What can I do in the bedroom? Use night lights. Make sure that you have a light by your bed that is easy to reach. Do not use any sheets or blankets that are too big for your bed. They should not hang down onto the floor. Have a firm chair that has side arms. You can use this for support while you get dressed. Do not have throw rugs and other things on the floor that can make you trip. What can I do in the kitchen? Clean up any spills right away. Avoid walking on wet floors. Keep items that you use a lot in easy-to-reach places. If you need to reach something above you, use a strong step stool that has a grab bar. Keep electrical cords out of the  way. Do not use floor polish or wax that makes floors slippery. If you must use wax, use non-skid floor wax. Do not have throw rugs and other things on the floor that can make you trip. What can I do with my stairs? Do not leave any items on the stairs. Make sure that there are handrails on both sides of the stairs and use them. Fix handrails that are broken or loose. Make sure that handrails are as long as the stairways. Check any carpeting to make sure that it is firmly attached to the stairs. Fix any carpet that is loose or worn. Avoid having throw rugs at the top or bottom of the stairs. If you do have throw rugs, attach them to the floor with carpet tape. Make sure that you have a light switch at the top of the stairs and the bottom of the stairs. If you do not have them, ask someone to add them for you. What else can I do to help prevent falls? Wear shoes that: Do not have high heels. Have rubber  bottoms. Are comfortable and fit you well. Are closed at the toe. Do not wear sandals. If you use a stepladder: Make sure that it is fully opened. Do not climb a closed stepladder. Make sure that both sides of the stepladder are locked into place. Ask someone to hold it for you, if possible. Clearly mark and make sure that you can see: Any grab bars or handrails. First and last steps. Where the edge of each step is. Use tools that help you move around (mobility aids) if they are needed. These include: Canes. Walkers. Scooters. Crutches. Turn on the lights when you go into a dark area. Replace any light bulbs as soon as they burn out. Set up your furniture so you have a clear path. Avoid moving your furniture around. If any of your floors are uneven, fix them. If there are any pets around you, be aware of where they are. Review your medicines with your doctor. Some medicines can make you feel dizzy. This can increase your chance of falling. Ask your doctor what other things that you can do to help prevent falls. This information is not intended to replace advice given to you by your health care provider. Make sure you discuss any questions you have with your health care provider. Document Released: 03/12/2009 Document Revised: 10/22/2015 Document Reviewed: 06/20/2014 Elsevier Interactive Patient Education  2017 [employer].

## 2022-03-09 NOTE — Progress Notes (Signed)
Subjective:   Amanda Liu is a 66 y.o. female who presents for Medicare Annual (Subsequent) preventive examination.  Review of Systems    No ROS.  Medicare Wellness Virtual Visit.  Visual/audio telehealth visit, UTA vital signs.   See social history for additional risk factors.   Cardiac Risk Factors include: advanced age (>50mn, >>49women)     Objective:    Today's Vitals   03/09/22 0908  Weight: 178 lb (80.7 kg)  Height: 5' 5.5" (1.664 m)   Body mass index is 29.17 kg/m.     03/09/2022    9:21 AM 11/25/2020   11:31 AM 11/25/2019   11:48 AM 01/16/2018    9:47 AM 06/01/2017    8:59 AM 05/01/2017    9:35 AM 11/29/2016    8:37 AM  Advanced Directives  Does Patient Have a Medical Advance Directive? No No No No No No No  Does patient want to make changes to medical advance directive?      No - Patient declined   Would patient like information on creating a medical advance directive? No - Patient declined No - Patient declined No - Patient declined No - Patient declined No - Patient declined      Current Medications (verified) Outpatient Encounter Medications as of 03/09/2022  Medication Sig   amLODipine (NORVASC) 5 MG tablet TAKE 1 TABLET EVERY DAY   hydrochlorothiazide (HYDRODIURIL) 25 MG tablet TAKE 1 TABLET EVERY DAY   lisinopril (ZESTRIL) 10 MG tablet TAKE 1 TABLET EVERY DAY   rosuvastatin (CRESTOR) 20 MG tablet Take 1 tablet (20 mg total) by mouth daily.   traZODone (DESYREL) 50 MG tablet TAKE 1/2 TO 1 TABLET(25 TO 50 MG) BY MOUTH AT BEDTIME AS NEEDED FOR SLEEP (Patient not taking: Reported on 10/18/2021)   [DISCONTINUED] Elastic Bandages & Supports (WRIST SPLINT/COCK-UP/LEFT M) MISC Apply nightly (Patient not taking: Reported on 10/18/2021)   [DISCONTINUED] Elastic Bandages & Supports (WRIST SPLINT/COCK-UP/RIGHT M) MISC Applied nightly (Patient not taking: Reported on 10/18/2021)   No facility-administered encounter medications on file as of 03/09/2022.    Allergies  (verified) Patient has no known allergies.   History: Past Medical History:  Diagnosis Date   Allergic rhinitis    [place]   left breast lumpectomy, radiation and chemotherapy   Chickenpox    Elevated blood pressure    Fatigue due to depression 06/18/2015   [place] due to [place]otherapeutic drug [place] 11/17/2015   Personal history of chemotherapy 08/2011   left breast ca   Personal history of radiation therapy 03/2012-05/2012   left breast ca   UTI (lower urinary tract infection)    Past Surgical History:  Procedure Laterality Date   ABDOMINAL HYSTERECTOMY     BREAST BIOPSY Left 07/2011   left breast invasive mammary carcinoma   BREAST LUMPECTOMY Left 03/16/2012   invasive mammary carcinoma: invasive ductal carcinoma. clear margins after reexcision. 5 LN positive for macrometastasis   TONSILLECTOMY     Family History  Problem Relation Age of Onset   Breast cancer Sister [phone]  Breast cancer Maternal Grandmother [phone]  Alcoholism Other        Parent   Alcohol abuse Father    Post-traumatic stress disorder Daughter        mental/emotional abuse   Eating disorder Daughter    Social History   Socioeconomic History   Marital status: Married    Spouse name: Not on file   Number of children: Not on  file   Years of education: Not on file   Highest education level: Not on file  Occupational History   Not on file  Tobacco Use   Smoking status: Former   Smokeless tobacco: Never  Vaping Use   Vaping Use: Never used  Substance and Sexual Activity   Alcohol use: No    Alcohol/week: 0.0 standard drinks of alcohol   Drug use: No   Sexual activity: Yes  Other Topics Concern   Not on file  Social History Narrative   Not on file   Social Determinants of Health   Financial Resource Strain: Low Risk  (03/09/2022)   Overall Financial Resource Strain (CARDIA)    Difficulty of Paying Living Expenses: Not hard at all  Food Insecurity: No Food Insecurity  (11/25/2020)   Hunger Vital Sign    Worried About Running Out of Food in the Last Year: Never true    [place] in the Last Year: Never true  Transportation Needs: No Transportation Needs (03/09/2022)   PRAPARE - Hydrologist (Medical): No    Lack of Transportation (Non-Medical): No  Physical Activity: Sufficiently Active (03/09/2022)   Exercise Vital Sign    Days of Exercise per Week: 5 days    Minutes of Exercise per Session: 30 min  Stress: No Stress Concern Present (03/09/2022)   Blue Eye    Feeling of Stress : Not at all  Social Connections: Unknown (03/09/2022)   Social Connection and Isolation Panel [NHANES]    Frequency of Communication with Friends and Family: Not on file    Frequency of Social Gatherings with Friends and Family: Not on file    Attends Religious Services: Not on file    Active Member of Clubs or Organizations: Not on file    Attends Archivist Meetings: Not on file    Marital Status: Married    Tobacco Counseling Counseling given: Not Answered   Clinical Intake:  Pre-visit preparation completed: Yes        Diabetes: No  How often do you need to have someone help you when you read instructions, pamphlets, or other written materials from your doctor or pharmacy?: 1 - Never    Interpreter Needed?: No      Activities of Daily Living    03/09/2022    9:15 AM  In your present state of health, do you have any difficulty performing the following activities:  Hearing? 0  Vision? 0  Difficulty concentrating or making decisions? 0  Walking or climbing stairs? 0  Comment Monitors her footing when climbing stairs.  Dressing or bathing? 0  Doing errands, shopping? 0  Preparing Food and eating ? N  Using the Toilet? N  In the past six months, have you accidently leaked urine? N  Do you have problems with loss of bowel control? N   Managing your Medications? N  Managing your Finances? N  Housekeeping or managing your Housekeeping? N    Patient Care Team: Amanda Haven, MD as PCP - General (Family Medicine)  Indicate any recent Medical Services you may have received from other than [facility] providers in the past year (date may be approximate).     Assessment:   This is a routine wellness examination for Amanda Liu.  I connected with  Amanda Liu on 03/09/22 by a audio enabled telemedicine application and verified that I am speaking with the correct person using  two identifiers.  Patient Location: Home  Provider Location: Office/Clinic  I discussed the limitations of evaluation and management by telemedicine. The patient expressed understanding and agreed to proceed.   Hearing/Vision screen Hearing Screening - Comments:: Patient is able to hear conversational tones without difficulty. No issues reported. Vision Screening - Comments:: Followed by Suzie Portela Wears corrective lenses  They have seen their ophthalmologist.  Recommended annual ophthalmology exams for early detection of glaucoma and other disorders of the eye.  Dietary issues and exercise activities discussed: Current Exercise Habits: Home exercise routine, Type of exercise: walking, Intensity: Mild Healthy diet Good water intake   Goals Addressed             This Visit's Progress    Maintain Healthy Lifestyle       Stay hydrated Stay active Healthy diet       Depression Screen    03/09/2022    9:14 AM 10/18/2021    8:05 AM 11/25/2020   11:29 AM 11/25/2019   11:51 AM 01/28/2019    8:33 AM 06/06/2018   10:23 AM 05/01/2017    9:25 AM  PHQ 2/9 Scores  PHQ - 2 Score 0 0 0 0 0 0 0  PHQ- 9 Score       0    Fall Risk    03/09/2022    9:10 AM 10/18/2021    8:05 AM 11/25/2020   12:24 PM 11/25/2019   11:49 AM 01/28/2019    8:30 AM  Fall Risk   Falls in the past year? 1 0 0 0 0  Comment None since reported in May.      Number falls in  past yr: 0 0 0 0 0  Injury with Fall?  0 0    Comment Tripped and fell. Did not seek medical attention.      Risk for fall due to : No Fall Risks No Fall Risks     Follow up Falls evaluation completed Falls evaluation completed Falls evaluation completed Falls evaluation completed Falls evaluation completed    [place]: Home free of loose throw rugs in walkways, pet beds, electrical cords, etc? Yes  Adequate lighting in your home to reduce risk of falls? Yes   ASSISTIVE DEVICES UTILIZED TO PREVENT FALLS: Life alert? No  Use of a cane, walker or w/c? No  Grab bars in the bathroom? No  Shower chair or bench in shower? No  Elevated toilet seat or a handicapped toilet? No   TIMED UP AND GO: Was the test performed? No .   Cognitive Function: Patient is alert and oriented x3. Enjoys cross stitching and reading.  100% independent.  Manages her own medications and finances.      11/25/2019   11:56 AM 05/01/2017    9:46 AM  MMSE - Mini Mental State Exam  Not completed: Unable to complete   Orientation to time  5  Orientation to Place  5  Registration  3  Attention/ Calculation  5  Recall  3  Language- name 2 objects  2  Language- repeat  1  Language- follow 3 step command  3  Language- read & follow direction  1  Write a sentence  1  Copy design  1  Total score  30        03/09/2022    9:18 AM 04/29/2016    9:45 AM  6CIT Screen  What Year? 0 points 0 points  What month? 0 points  0 points  What time? 0 points 0 points  Count back from 20 0 points 0 points  Months in reverse 0 points 0 points  Repeat phrase 0 points 0 points  Total Score 0 points 0 points    Immunizations Immunization History  Administered Date(s) Administered   Influenza,inj,Quad PF,6+ Mos 02/25/2015, 03/18/2016, 05/01/2017, 02/20/2018    TDAP status: Due, Education has been provided regarding the importance of this vaccine. Advised may receive this vaccine at  local pharmacy or Health Dept. Aware to provide a copy of the vaccination record if obtained from local pharmacy or Health Dept. Verbalized acceptance and understanding.  Flu Vaccine status: Due, Education has been provided regarding the importance of this vaccine. Advised may receive this vaccine at local pharmacy or Health Dept. Aware to provide a copy of the vaccination record if obtained from local pharmacy or Health Dept. Verbalized acceptance and understanding.  Pneumococcal vaccine status: Due, Education has been provided regarding the importance of this vaccine. Advised may receive this vaccine at local pharmacy or Health Dept. Aware to provide a copy of the vaccination record if obtained from local pharmacy or Health Dept. Verbalized acceptance and understanding.  Covid-19 vaccine status: Declined, Education has been provided regarding the importance of this vaccine but patient still declined. Advised may receive this vaccine at local pharmacy or Health Dept.or vaccine clinic. Aware to provide a copy of the vaccination record if obtained from local pharmacy or Health Dept. Verbalized acceptance and understanding.   Shingrix Completed?: No.    Education has been provided regarding the importance of this vaccine. Patient has been advised to call insurance company to determine out of pocket expense if they have not yet received this vaccine. Advised may also receive vaccine at local pharmacy or Health Dept. Verbalized acceptance and understanding.  Screening Tests Health Maintenance  Topic Date Due   COLONOSCOPY (Pts 45-79yr Insurance coverage will need to be confirmed)  04/29/2022 (Originally 07/19/2000)   Zoster Vaccines- Shingrix (1 of 2) 06/09/2022 (Originally 07/19/1974)   INFLUENZA VACCINE  08/28/2022 (Originally 12/28/2021)   Pneumonia Vaccine 66 Years old (1 - PCV) 03/10/2023 (Originally 07/19/2020)   TETANUS/TDAP  03/10/2023 (Originally 07/19/1974)   MAMMOGRAM  02/24/2023   DEXA SCAN   Completed   Hepatitis C Screening  Completed   HPV VACCINES  Aged Out   COVID-19 Vaccine  Discontinued   Health Maintenance There are no preventive care reminders to display for this patient.  Colonoscopy- agrees to reschedule. Number for scheduling offered, declined. Notes she has point of contact to call.   Mammogram- agrees to reschedule. Number for scheduling offered, declined.Notes she has point of contact to call.  Lung Cancer Screening: (Low Dose CT Chest recommended if Age 66-80years, 30 pack-year currently smoking OR have quit w/in 15years.) does not qualify.   Hepatitis C Screening: Completed 2019.  Vision Screening: Recommended annual ophthalmology exams for early detection of glaucoma and other disorders of the eye.  Dental Screening: Recommended annual dental exams for proper oral hygiene.  Community Resource Referral / Chronic Care Management: CRR required this visit?  No   CCM required this visit?  No      Plan:    Plans to discuss neuropathy with PCP next OV in November. Not worsening but still present. Intermittent.   I have personally reviewed and noted the following in the patient's chart:   Medical and social history Use of alcohol, tobacco or illicit drugs  Current medications and supplements including opioid prescriptions.  Patient is not currently taking opioid prescriptions. Functional ability and status Nutritional status Physical activity Advanced directives List of other physicians Hospitalizations, surgeries, and ER visits in previous 12 months Vitals Screenings to include cognitive, depression, and falls Referrals and appointments  In addition, I have reviewed and discussed with patient certain preventive protocols, quality metrics, and best practice recommendations. A written personalized care plan for preventive services as well as general preventive health recommendations were provided to patient.     Varney Biles,  LPN   01/72/0910

## 2022-04-20 ENCOUNTER — Ambulatory Visit: Payer: Medicare HMO | Admitting: Family Medicine

## 2022-06-08 ENCOUNTER — Ambulatory Visit (INDEPENDENT_AMBULATORY_CARE_PROVIDER_SITE_OTHER): Payer: Medicare HMO | Admitting: Family Medicine

## 2022-06-08 ENCOUNTER — Ambulatory Visit (INDEPENDENT_AMBULATORY_CARE_PROVIDER_SITE_OTHER): Payer: Medicare HMO

## 2022-06-08 ENCOUNTER — Encounter: Payer: Self-pay | Admitting: Family Medicine

## 2022-06-08 VITALS — BP 120/80 | HR 87 | Temp 98.7°F | Ht 65.0 in | Wt 152.0 lb

## 2022-06-08 DIAGNOSIS — E785 Hyperlipidemia, unspecified: Secondary | ICD-10-CM | POA: Diagnosis not present

## 2022-06-08 DIAGNOSIS — I10 Essential (primary) hypertension: Secondary | ICD-10-CM | POA: Diagnosis not present

## 2022-06-08 DIAGNOSIS — M5441 Lumbago with sciatica, right side: Secondary | ICD-10-CM

## 2022-06-08 DIAGNOSIS — R7303 Prediabetes: Secondary | ICD-10-CM | POA: Diagnosis not present

## 2022-06-08 DIAGNOSIS — M545 Low back pain, unspecified: Secondary | ICD-10-CM | POA: Diagnosis not present

## 2022-06-08 LAB — BASIC METABOLIC PANEL
BUN: 11 mg/dL (ref 6–23)
CO2: 26 mEq/L (ref 19–32)
Calcium: 10 mg/dL (ref 8.4–10.5)
Chloride: 100 mEq/L (ref 96–112)
Creatinine, Ser: 0.64 mg/dL (ref 0.40–1.20)
GFR: 91.79 mL/min (ref >60.00)
Glucose, Bld: 105 mg/dL — ABNORMAL HIGH (ref 70–99)
Potassium: 3.7 mEq/L (ref 3.5–5.1)
Sodium: 138 mEq/L (ref 135–145)

## 2022-06-08 LAB — HEMOGLOBIN A1C: Hgb A1c MFr Bld: 6.3 % (ref 4.6–6.5)

## 2022-06-08 MED ORDER — PREDNISONE 20 MG PO TABS: 40.0000 mg | ORAL_TABLET | Freq: Every day | ORAL | 0 refills | Status: DC

## 2022-06-08 MED ORDER — ROSUVASTATIN CALCIUM 10 MG PO TABS: 10.0000 mg | ORAL_TABLET | Freq: Every day | ORAL | 1 refills | Status: DC

## 2022-06-08 NOTE — Assessment & Plan Note (Addendum)
Chronic issue.  Patient has been taking Crestor 10 mg daily.  Refill today.

## 2022-06-08 NOTE — Assessment & Plan Note (Addendum)
Chronic issue.  Adequately controlled.  She will continue amlodipine 5 mg daily, HCTZ 25 mg daily, and lisinopril 10 mg daily.

## 2022-06-08 NOTE — Patient Instructions (Signed)
Nice to see you. Physical therapy should contact you to set up an appointment in the next couple of weeks.  If you do not hear from them please let us know. We will let you know what your lab and x-ray results show.  If you have any side effects with the prednisone please let us know.

## 2022-06-08 NOTE — Progress Notes (Signed)
Tommi Rumps, MD Phone: [phone]  Amanda Liu is a 67 y.o. female who presents today for f/u.  HYPERTENSION Disease Monitoring Home BP Monitoring not checking Chest pain- no    Dyspnea- no Medications Compliance-  taking amlodipine, Linsinopril, HCTZ.  Edema- no BMET    Component Value Date/Time   NA 138 10/18/2021 0829   NA 134 (L) 08/27/2014 1127   K 3.9 10/18/2021 0829   K 3.3 (L) 08/27/2014 1127   CL 99 10/18/2021 0829   CL 101 08/27/2014 1127   CO2 29 10/18/2021 0829   CO2 25 08/27/2014 1127   GLUCOSE 101 (H) 10/18/2021 0829   GLUCOSE 140 (H) 08/27/2014 1127   BUN 9 10/18/2021 0829   BUN 13 08/27/2014 1127   CREATININE 0.64 10/18/2021 0829   CREATININE 0.58 08/27/2014 1127   CALCIUM 9.8 10/18/2021 0829   CALCIUM 9.4 08/27/2014 1127   GFRNONAA >60 06/01/2017 0848   GFRNONAA >60 08/27/2014 1127   GFRAA >60 06/01/2017 0848   GFRAA >60 08/27/2014 1127   Low back pain: Patient notes intermittent issues with low back pain and pain radiating down from her buttock to her hamstring area on the right.  This has been going on for 2 months.  Typically happens when she gets up from a seated position in a certain way or if she bends over and stands up.  By the end of the day she has some more persistent low back pain on the right side.  No numbness, weakness, or incontinence.  Her pain does not radiate past her knee.  Prediabetes: Patient notes she is not as hungry as she used to be so she does not eat as much.  She has fruit and crackers for breakfast.  Not as many sweets as she used to.  She does have 2-3 sweet teas per day.  She walks her dog and does housework for exercise.  Social History   Tobacco Use  Smoking Status Former  Smokeless Tobacco Never    Current Outpatient Medications on File Prior to Visit  Medication Sig Dispense Refill   amLODipine (NORVASC) 5 MG tablet TAKE 1 TABLET EVERY DAY 90 tablet 0   hydrochlorothiazide (HYDRODIURIL) 25 MG tablet TAKE  1 TABLET EVERY DAY 90 tablet 0   lisinopril (ZESTRIL) 10 MG tablet TAKE 1 TABLET EVERY DAY 90 tablet 0   traZODone (DESYREL) 50 MG tablet TAKE 1/2 TO 1 TABLET(25 TO 50 MG) BY MOUTH AT BEDTIME AS NEEDED FOR SLEEP 30 tablet 3   No current facility-administered medications on file prior to visit.     ROS see history of present illness  Objective  Physical Exam Vitals:   06/08/22 0909  BP: 120/80  Pulse: 87  Temp: 98.7 F (37.1 C)  SpO2: 96%    BP Readings from Last 3 Encounters:  06/08/22 120/80  10/18/21 120/80  06/06/18 102/78   Wt Readings from Last 3 Encounters:  06/08/22 152 lb (68.9 kg)  03/09/22 178 lb (80.7 kg)  10/18/21 178 lb 0.8 oz (80.8 kg)    Physical Exam Constitutional:      General: She is not in acute distress.    Appearance: She is not diaphoretic.  Cardiovascular:     Rate and Rhythm: Normal rate and regular rhythm.     Heart sounds: Normal heart sounds.  Pulmonary:     Effort: Pulmonary effort is normal.     Breath sounds: Normal breath sounds.  Musculoskeletal:     Comments: No midline  spine tenderness, no midline spine step-off, there is muscular back tenderness just above the right side SI joint  Skin:    General: Skin is warm and dry.  Neurological:     Mental Status: She is alert.     Comments: 5/5 strength bilateral quads, hamstrings, plantarflexion, and dorsiflexion, sensation light touch intact bilateral lower extremities, negative slump test bilaterally      Assessment/Plan: Please see individual problem list.  Prediabetes Assessment & Plan: Chronic issue.  Check A1c.  Encouraged diet and exercise.  Discussed reducing sugar intake through sweet tea reduction.  Orders: -     Hemoglobin A1c  Hyperlipidemia, unspecified hyperlipidemia type Assessment & Plan: Chronic issue.  Patient has been taking Crestor 10 mg daily.  Refill today.  Orders: -     Rosuvastatin Calcium; Take 1 tablet (10 mg total) by mouth daily.  Dispense: 90  tablet; Refill: 1  Essential hypertension Assessment & Plan: Chronic issue.  Adequately controlled.  She will continue amlodipine 5 mg daily, HCTZ 25 mg daily, and lisinopril 10 mg daily.  Orders: -     Basic metabolic panel  Acute right-sided low back pain with right-sided sciatica Assessment & Plan: Patient with right-sided low back pain for the last 2 months.  She likely has nerve impingement.  We will treat with prednisone 40 mg daily for 5 days.  Discussed the risk of sleep issues, agitation, and increased appetite.  We will also refer for physical therapy.  Given that this has been going on for 2 months we will get an x-ray today.  Orders: -     Ambulatory referral to Physical Therapy -     DG Lumbar Spine Complete; Future -     predniSONE; Take 2 tablets (40 mg total) by mouth daily with breakfast. (Patient not taking: Reported on 06/08/2022)  Dispense: 10 tablet; Refill: 0     Return in about 6 months (around 12/07/2022) for Hypertension.   Tommi Rumps, MD [place]

## 2022-06-08 NOTE — Assessment & Plan Note (Signed)
Chronic issue.  Check A1c.  Encouraged diet and exercise.  Discussed reducing sugar intake through sweet tea reduction.

## 2022-06-08 NOTE — Assessment & Plan Note (Signed)
Patient with right-sided low back pain for the last 2 months.  She likely has nerve impingement.  We will treat with prednisone 40 mg daily for 5 days.  Discussed the risk of sleep issues, agitation, and increased appetite.  We will also refer for physical therapy.  Given that this has been going on for 2 months we will get an x-ray today.

## 2022-06-13 ENCOUNTER — Other Ambulatory Visit: Payer: Self-pay | Admitting: Family

## 2022-06-13 DIAGNOSIS — I10 Essential (primary) hypertension: Secondary | ICD-10-CM

## 2022-06-20 ENCOUNTER — Telehealth: Payer: Self-pay | Admitting: Family Medicine

## 2022-06-20 NOTE — Telephone Encounter (Signed)
Patient called and stated that [facility] stated that they can not get a response for the following refills: amLODipine (NORVASC) 5 MG tablet, hydrochlorothiazide (HYDRODIURIL) 25 MG tablet and  lisinopril (ZESTRIL) 10 MG tablet. Patient states she is not out of her medications yet.

## 2022-06-21 ENCOUNTER — Other Ambulatory Visit: Payer: Self-pay

## 2022-06-21 DIAGNOSIS — I10 Essential (primary) hypertension: Secondary | ICD-10-CM

## 2022-06-21 MED ORDER — HYDROCHLOROTHIAZIDE 25 MG PO TABS: 25.0000 mg | ORAL_TABLET | Freq: Every day | ORAL | 0 refills | Status: DC

## 2022-06-21 MED ORDER — AMLODIPINE BESYLATE 5 MG PO TABS: 5.0000 mg | ORAL_TABLET | Freq: Every day | ORAL | 0 refills | Status: DC

## 2022-06-21 MED ORDER — LISINOPRIL 10 MG PO TABS: 10.0000 mg | ORAL_TABLET | Freq: Every day | ORAL | 0 refills | Status: DC

## 2022-06-21 NOTE — Telephone Encounter (Signed)
Medications sent to [place].  Elton Catalano,cma

## 2022-06-27 DIAGNOSIS — M5451 Vertebrogenic low back pain: Secondary | ICD-10-CM | POA: Diagnosis not present

## 2022-07-04 DIAGNOSIS — M5451 Vertebrogenic low back pain: Secondary | ICD-10-CM | POA: Diagnosis not present

## 2022-07-07 ENCOUNTER — Encounter: Payer: Self-pay | Admitting: Family Medicine

## 2022-07-08 MED ORDER — PREDNISONE 20 MG PO TABS: 40.0000 mg | ORAL_TABLET | Freq: Every day | ORAL | 0 refills | Status: DC

## 2022-07-11 DIAGNOSIS — M5451 Vertebrogenic low back pain: Secondary | ICD-10-CM | POA: Diagnosis not present

## 2022-10-07 ENCOUNTER — Other Ambulatory Visit: Payer: Self-pay | Admitting: Family Medicine

## 2022-10-07 DIAGNOSIS — I10 Essential (primary) hypertension: Secondary | ICD-10-CM

## 2022-10-13 DIAGNOSIS — M25562 Pain in left knee: Secondary | ICD-10-CM | POA: Diagnosis not present

## 2022-10-13 DIAGNOSIS — M9903 Segmental and somatic dysfunction of lumbar region: Secondary | ICD-10-CM | POA: Diagnosis not present

## 2022-10-13 DIAGNOSIS — M5442 Lumbago with sciatica, left side: Secondary | ICD-10-CM | POA: Diagnosis not present

## 2022-10-13 DIAGNOSIS — M9904 Segmental and somatic dysfunction of sacral region: Secondary | ICD-10-CM | POA: Diagnosis not present

## 2022-10-13 DIAGNOSIS — M461 Sacroiliitis, not elsewhere classified: Secondary | ICD-10-CM | POA: Diagnosis not present

## 2022-10-13 DIAGNOSIS — M25561 Pain in right knee: Secondary | ICD-10-CM | POA: Diagnosis not present

## 2022-10-17 DIAGNOSIS — M25561 Pain in right knee: Secondary | ICD-10-CM | POA: Diagnosis not present

## 2022-10-17 DIAGNOSIS — M9903 Segmental and somatic dysfunction of lumbar region: Secondary | ICD-10-CM | POA: Diagnosis not present

## 2022-10-17 DIAGNOSIS — M5442 Lumbago with sciatica, left side: Secondary | ICD-10-CM | POA: Diagnosis not present

## 2022-10-17 DIAGNOSIS — M9904 Segmental and somatic dysfunction of sacral region: Secondary | ICD-10-CM | POA: Diagnosis not present

## 2022-10-17 DIAGNOSIS — M25562 Pain in left knee: Secondary | ICD-10-CM | POA: Diagnosis not present

## 2022-10-17 DIAGNOSIS — M461 Sacroiliitis, not elsewhere classified: Secondary | ICD-10-CM | POA: Diagnosis not present

## 2022-10-25 ENCOUNTER — Ambulatory Visit: Payer: Medicare HMO

## 2022-10-27 ENCOUNTER — Ambulatory Visit: Payer: Medicare HMO

## 2022-11-17 DIAGNOSIS — M25561 Pain in right knee: Secondary | ICD-10-CM | POA: Diagnosis not present

## 2022-11-17 DIAGNOSIS — M25562 Pain in left knee: Secondary | ICD-10-CM | POA: Diagnosis not present

## 2022-11-17 DIAGNOSIS — M461 Sacroiliitis, not elsewhere classified: Secondary | ICD-10-CM | POA: Diagnosis not present

## 2022-11-17 DIAGNOSIS — M9904 Segmental and somatic dysfunction of sacral region: Secondary | ICD-10-CM | POA: Diagnosis not present

## 2022-11-17 DIAGNOSIS — M9903 Segmental and somatic dysfunction of lumbar region: Secondary | ICD-10-CM | POA: Diagnosis not present

## 2022-11-17 DIAGNOSIS — M5442 Lumbago with sciatica, left side: Secondary | ICD-10-CM | POA: Diagnosis not present

## 2022-11-30 ENCOUNTER — Other Ambulatory Visit: Payer: Self-pay | Admitting: Family Medicine

## 2022-11-30 DIAGNOSIS — E785 Hyperlipidemia, unspecified: Secondary | ICD-10-CM

## 2022-12-09 ENCOUNTER — Ambulatory Visit: Payer: Medicare HMO | Admitting: Family Medicine

## 2022-12-15 DIAGNOSIS — M25562 Pain in left knee: Secondary | ICD-10-CM | POA: Diagnosis not present

## 2022-12-15 DIAGNOSIS — M25561 Pain in right knee: Secondary | ICD-10-CM | POA: Diagnosis not present

## 2022-12-15 DIAGNOSIS — M461 Sacroiliitis, not elsewhere classified: Secondary | ICD-10-CM | POA: Diagnosis not present

## 2022-12-15 DIAGNOSIS — M9903 Segmental and somatic dysfunction of lumbar region: Secondary | ICD-10-CM | POA: Diagnosis not present

## 2022-12-15 DIAGNOSIS — M9904 Segmental and somatic dysfunction of sacral region: Secondary | ICD-10-CM | POA: Diagnosis not present

## 2022-12-15 DIAGNOSIS — M5442 Lumbago with sciatica, left side: Secondary | ICD-10-CM | POA: Diagnosis not present

## 2022-12-28 ENCOUNTER — Encounter (INDEPENDENT_AMBULATORY_CARE_PROVIDER_SITE_OTHER): Payer: Self-pay

## 2022-12-29 DIAGNOSIS — M461 Sacroiliitis, not elsewhere classified: Secondary | ICD-10-CM | POA: Diagnosis not present

## 2022-12-29 DIAGNOSIS — M25561 Pain in right knee: Secondary | ICD-10-CM | POA: Diagnosis not present

## 2022-12-29 DIAGNOSIS — M25562 Pain in left knee: Secondary | ICD-10-CM | POA: Diagnosis not present

## 2022-12-29 DIAGNOSIS — M5442 Lumbago with sciatica, left side: Secondary | ICD-10-CM | POA: Diagnosis not present

## 2022-12-29 DIAGNOSIS — M9904 Segmental and somatic dysfunction of sacral region: Secondary | ICD-10-CM | POA: Diagnosis not present

## 2022-12-29 DIAGNOSIS — M9903 Segmental and somatic dysfunction of lumbar region: Secondary | ICD-10-CM | POA: Diagnosis not present

## 2023-01-16 ENCOUNTER — Telehealth: Payer: Self-pay | Admitting: Family Medicine

## 2023-01-16 NOTE — Telephone Encounter (Signed)
Patient called and wants to know if someone can call her. She said she is feeling bad. Patient said she is real tired, and not having a appetite. She took 3 different covid test and they were negative. She would like for someone to call her so she can find out what to do. She also said she has chills. Her number is [phone].

## 2023-01-18 ENCOUNTER — Ambulatory Visit
Admission: RE | Admit: 2023-01-18 | Discharge: 2023-01-18 | Disposition: A | Discharge status: Home / Self Care | Payer: Medicare HMO | Source: Ambulatory Visit

## 2023-01-18 VITALS — BP 130/86 | HR 98 | Temp 97.7°F | Resp 20

## 2023-01-18 DIAGNOSIS — J069 Acute upper respiratory infection, unspecified: Secondary | ICD-10-CM | POA: Diagnosis not present

## 2023-01-18 MED ORDER — AMOXICILLIN-POT CLAVULANATE 875-125 MG PO TABS: 1.0000 | ORAL_TABLET | Freq: Two times a day (BID) | ORAL | 0 refills | Status: DC

## 2023-01-18 MED ORDER — PREDNISONE 20 MG PO TABS: 40.0000 mg | ORAL_TABLET | Freq: Every day | ORAL | 0 refills | Status: DC

## 2023-01-18 MED ORDER — BENZONATATE 100 MG PO CAPS: 100.0000 mg | ORAL_CAPSULE | Freq: Three times a day (TID) | ORAL | 0 refills | Status: DC

## 2023-01-18 MED ORDER — PROMETHAZINE-DM 6.25-15 MG/5ML PO SYRP: 2.5000 mL | ORAL_SOLUTION | Freq: Every evening | ORAL | 0 refills | Status: DC | PRN

## 2023-01-18 NOTE — Discharge Instructions (Addendum)
take Augmentin every morning and every evening for 7 days to provide coverage for bacteria  Take prednisone every morning with food for 5 days open and relaxed airway, will also help with abdominal and chest muscles  You may use Tessalon pill every 8 hours as needed to help calm your coughing  May use cough syrup at bedtime as needed for additional comfort, be mindful this will make you drowsy    You can take Tylenol and/or Ibuprofen as needed for fever reduction and pain relief.   For cough: honey 1/2 to 1 teaspoon (you can dilute the honey in water or another fluid).  You can also use guaifenesin and dextromethorphan for cough. You can use a humidifier for chest congestion and cough.  If you don't have a humidifier, you can sit in the bathroom with the hot shower running.      For sore throat: try warm salt water gargles, cepacol lozenges, throat spray, warm tea or water with lemon/honey, popsicles or ice, or OTC cold relief medicine for throat discomfort.   For congestion: take a daily anti-histamine like Zyrtec, Claritin, and a oral decongestant, such as pseudoephedrine.  You can also use Flonase 1-2 sprays in each nostril daily.   It is important to stay hydrated: drink plenty of fluids (water, gatorade/powerade/pedialyte, juices, or teas) to keep your throat moisturized and help further relieve irritation/discomfort.

## 2023-01-18 NOTE — ED Provider Notes (Signed)
Renaldo Fiddler    CSN: [id number] Arrival date & time: 01/18/23  1048      History   Chief Complaint Chief Complaint  Patient presents with   Nasal Congestion    Cough.  Fatigue.  Just feel awful. - Entered by patient    HPI Amanda Liu is a 68 y.o. female.   Patient presents for evaluation of fatigue and malaise, nasal congestion, hoarseness, tenderness to the left ear, postnasal drip, productive cough present for 8 days.  Symptoms waxing and waning but worsening in severity overnight.  Has had a decreased appetite with an altered taste causing a poor intake of food but tolerating liquids.  Associated intermittent sinus headaches. Persistent cough worsening at nighttime, has caused soreness to the chest and abdominal muscles, denies presence of shortness of breath or wheezing.  Has had low-grade fevers, last occurring 3 to 4 days ago.  No known sick contacts prior.  Has taken 3 home COVID test, negative.  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   Personal history of chemotherapy 08/2011   left breast ca   Personal history of radiation therapy 03/2012-05/2012   left breast ca   UTI (lower urinary tract infection)     Patient Active Problem List   Diagnosis Date Noted   Hyperlipidemia 06/08/2022   Low back pain 06/08/2022   Bilateral knee pain 10/18/2021   Fall 10/18/2021   Prediabetes 06/06/2018   Insomnia 03/03/2018   Bilateral carpal tunnel syndrome 10/31/2017   Anxiety 10/31/2017   Tremor 02/17/2016   Hot flashes 02/17/2016   Allergic rhinitis 02/17/2016   Essential hypertension 11/17/2015   History of breast cancer in female 11/17/2015   Stress incontinence 11/17/2015   Osteopenia 10/28/2014   Malignant neoplasm  of left breast in female, estrogen receptor positive (HCC) 08/16/2011    Past Surgical History:  Procedure Laterality Date   ABDOMINAL HYSTERECTOMY     BREAST BIOPSY Left 07/2011   left breast invasive mammary carcinoma   BREAST LUMPECTOMY Left 03/16/2012   invasive mammary carcinoma: invasive ductal carcinoma. clear margins after reexcision. 5 LN positive for macrometastasis   TONSILLECTOMY      OB History   No obstetric history on file.      Home Medications    Prior to Admission medications   Medication Sig Start Date End Date Taking? Authorizing Provider  amoxicillin-clavulanate (AUGMENTIN) 875-125 MG tablet Take 1 tablet by mouth every 12 (twelve) hours. 01/18/23  Yes Rilei Kravitz R, NP  benzonatate (TESSALON) 100 MG capsule Take 1 capsule (100 mg total) by mouth every 8 (eight) hours. 01/18/23  Yes Ryian Lynde R, NP  Calcium 200 MG TABS Take by mouth.   Yes [provider]  Glucosamine 500 MG CAPS Take by mouth.   Yes [provider]  predniSONE (DELTASONE) 20 MG tablet Take 2 tablets (40 mg total) by mouth daily. 01/18/23  Yes Ofilia Rayon, Elita Boone, NP  promethazine-dextromethorphan (PROMETHAZINE-DM) 6.25-15 MG/5ML syrup Take 2.5 mLs by mouth at bedtime as needed for cough. 01/18/23  Yes Rockney Grenz R, NP  amLODipine (NORVASC) 5 MG tablet TAKE 1 TABLET EVERY DAY 10/10/22   Glori Luis, MD  hydrochlorothiazide (HYDRODIURIL) 25 MG tablet TAKE 1 TABLET EVERY  DAY 10/10/22   Glori Luis, MD  lisinopril (ZESTRIL) 10 MG tablet TAKE 1 TABLET EVERY DAY 10/10/22   Glori Luis, MD  rosuvastatin (CRESTOR) 10 MG tablet TAKE 1 TABLET EVERY DAY 11/30/22   Glori Luis, MD  traZODone (DESYREL) 50 MG tablet TAKE 1/2 TO 1 TABLET(25 TO 50 MG) BY MOUTH AT BEDTIME AS NEEDED FOR SLEEP 03/02/21   Glori Luis, MD    Family History Family History  Problem Relation Age of Onset   Breast cancer Sister 59   Breast cancer Maternal Grandmother 66    Alcoholism Other        Parent   Alcohol abuse Father    Post-traumatic stress disorder Daughter        mental/emotional abuse   Eating disorder Daughter     Social History Social History   Tobacco Use   Smoking status: Former   Smokeless tobacco: Never  Vaping Use   Vaping status: Never Used  Substance Use Topics   Alcohol use: No    Alcohol/week: 0.0 standard drinks of alcohol   Drug use: No     Allergies   Patient has no known allergies.   Review of Systems Review of Systems   Physical Exam Triage Vital Signs ED Triage Vitals [01/18/23 1106]  Encounter Vitals Group     BP 130/86     Systolic BP Percentile      Diastolic BP Percentile      Pulse Rate 98     Resp 20     Temp 97.7 F (36.5 C)     Temp src      SpO2 98 %     Weight      Height      Head Circumference      Peak Flow      Pain Score      Pain Loc      Pain Education      Exclude from Growth Chart    No data found.  Updated Vital Signs BP 130/86   Pulse 98   Temp 97.7 F (36.5 C)   Resp 20   SpO2 98%   Visual Acuity Right Eye Distance:   Left Eye Distance:   Bilateral Distance:    Right Eye Near:   Left Eye Near:    Bilateral Near:     Physical Exam Constitutional:      Appearance: Normal appearance.  HENT:     Head: Normocephalic.     Right Ear: Hearing, ear canal and external ear normal. A middle ear effusion is present.     Left Ear: Hearing, ear canal and external ear normal. A middle ear effusion is present.     Nose: Congestion present. No rhinorrhea.     Mouth/Throat:     Mouth: Mucous membranes are moist.     Pharynx: Oropharynx is clear.  Eyes:     Extraocular Movements: Extraocular movements intact.  Cardiovascular:     Rate and Rhythm: Normal rate and regular rhythm.     Pulses: Normal pulses.     Heart sounds: Normal heart sounds.  Pulmonary:     Effort: Pulmonary effort is normal.     Breath sounds: Normal breath sounds.  Musculoskeletal:      Cervical back: Normal range of motion and neck supple.  Skin:    General: Skin is warm and dry.  Neurological:     Mental Status: She is alert and oriented to person, place,  and time. Mental status is at baseline.      UC Treatments / Results  Labs (all labs ordered are listed, but only abnormal results are displayed) Labs Reviewed - No data to display  EKG   Radiology No results found.  Procedures Procedures (including critical care time)  Medications Ordered in UC Medications - No data to display  Initial Impression / Assessment and Plan / UC Course  I have reviewed the triage vital signs and the nursing notes.  Pertinent labs & imaging results that were available during my care of the patient were reviewed by me and considered in my medical decision making (see chart for details).  Acute URI  Patient is in no signs of distress nor toxic appearing.  Vital signs are stable.  Low suspicion for pneumonia, pneumothorax or bronchitis and therefore will defer imaging.  Viral testing deferred due to timeline of illness, has completed 3 home COVID test which were all negative.  Symptoms progressing.  Days without signs of resolution prophylactically placing on antibiotics.  Augmentin sent to pharmacy.  Prescribed prednisone for management of cough and muscle soreness.  Additionally prescribed Tessalon and Promethazine DM for persistent coughing.May use additional over-the-counter medications as needed for supportive care.  May follow-up with urgent care as needed if symptoms persist or worsen.   Final Clinical Impressions(s) / UC Diagnoses   Final diagnoses:  Acute URI     Discharge Instructions      take Augmentin every morning and every evening for 7 days to provide coverage for bacteria  Take prednisone every morning with food for 5 days open and relaxed airway, will also help with abdominal and chest muscles  You may use Tessalon pill every 8 hours as needed to help  calm your coughing  May use cough syrup at bedtime as needed for additional comfort, be mindful this will make you drowsy    You can take Tylenol and/or Ibuprofen as needed for fever reduction and pain relief.   For cough: honey 1/2 to 1 teaspoon (you can dilute the honey in water or another fluid).  You can also use guaifenesin and dextromethorphan for cough. You can use a humidifier for chest congestion and cough.  If you don't have a humidifier, you can sit in the bathroom with the hot shower running.      For sore throat: try warm salt water gargles, cepacol lozenges, throat spray, warm tea or water with lemon/honey, popsicles or ice, or OTC cold relief medicine for throat discomfort.   For congestion: take a daily anti-histamine like Zyrtec, Claritin, and a oral decongestant, such as pseudoephedrine.  You can also use Flonase 1-2 sprays in each nostril daily.   It is important to stay hydrated: drink plenty of fluids (water, gatorade/powerade/pedialyte, juices, or teas) to keep your throat moisturized and help further relieve irritation/discomfort.    ED Prescriptions     Medication Sig Dispense Auth. Provider   amoxicillin-clavulanate (AUGMENTIN) 875-125 MG tablet Take 1 tablet by mouth every 12 (twelve) hours. 14 tablet Kaile Bixler R, NP   predniSONE (DELTASONE) 20 MG tablet Take 2 tablets (40 mg total) by mouth daily. 10 tablet Kerrilynn Derenzo R, NP   benzonatate (TESSALON) 100 MG capsule Take 1 capsule (100 mg total) by mouth every 8 (eight) hours. 21 capsule Elier Zellars R, NP   promethazine-dextromethorphan (PROMETHAZINE-DM) 6.25-15 MG/5ML syrup Take 2.5 mLs by mouth at bedtime as needed for cough. 118 mL Valinda Hoar, NP  PDMP not reviewed this encounter.   Valinda Hoar, NP 01/18/23 1154

## 2023-01-18 NOTE — ED Triage Notes (Signed)
Patient to Urgent Care with complaints of nasal congestion/ fatigue. Generally feeling unwell. Reports possible fevers- chills on Saturday night. Poor appetite. Cough at night.   Reports symptoms started approx 8 days ago. Three negative home Covid tests. Reports that she started feeling better on Saturday but worsened yesterday.

## 2023-01-19 ENCOUNTER — Telehealth: Payer: Self-pay

## 2023-01-19 NOTE — Telephone Encounter (Signed)
We receive a MyChart message from patient stating she is cancelling her upcoming appointment.  Patient states reason as "Unhappy/Changed Provider".

## 2023-01-20 NOTE — Telephone Encounter (Signed)
Called was able to reach patient I tried to apologize and rectify, I did advise patient PCP was not aware of the situation and that PCP would have responded if aware of situation. I advised patient to callback by Monday or Tuesday if patient would like to remain PCP's patient..Patient received antibiotics from UC for URI and is feeling better but not happy call was not returned in timely manner.Patient did give 48 hours to return call before seeking medical attention elsewhere. Again I apologized and gave reassurance that PCP had not received message. Patient stated " I will think about it and let you know".

## 2023-01-20 NOTE — Telephone Encounter (Signed)
See phone encounter from 01/16/2023.

## 2023-01-20 NOTE — Telephone Encounter (Signed)
Left message to call office

## 2023-01-20 NOTE — Telephone Encounter (Signed)
Noted.  Discussed with Olegario Messier.  I was not aware of this message.  I am glad she is feeling better.

## 2023-01-25 NOTE — Telephone Encounter (Signed)
Called patient back today after giving time to reconsider patient still states "I wish to change providers and to another office. Patient did state I appreciate" the provider and thanked Dr. Birdie Sons for the great care. " " I just cannot trust an office that does not return calls with my health."

## 2023-01-26 ENCOUNTER — Ambulatory Visit: Payer: Medicare HMO | Admitting: Family Medicine

## 2023-02-27 ENCOUNTER — Encounter: Payer: Self-pay | Admitting: Physician Assistant

## 2023-02-27 ENCOUNTER — Ambulatory Visit (INDEPENDENT_AMBULATORY_CARE_PROVIDER_SITE_OTHER): Payer: Medicare HMO | Admitting: Physician Assistant

## 2023-02-27 VITALS — BP 118/78 | HR 75 | Temp 98.1°F | Ht 65.0 in | Wt 173.0 lb

## 2023-02-27 DIAGNOSIS — G4700 Insomnia, unspecified: Secondary | ICD-10-CM | POA: Diagnosis not present

## 2023-02-27 DIAGNOSIS — J309 Allergic rhinitis, unspecified: Secondary | ICD-10-CM | POA: Diagnosis not present

## 2023-02-27 DIAGNOSIS — R053 Chronic cough: Secondary | ICD-10-CM

## 2023-02-27 DIAGNOSIS — E785 Hyperlipidemia, unspecified: Secondary | ICD-10-CM | POA: Diagnosis not present

## 2023-02-27 DIAGNOSIS — Z1231 Encounter for screening mammogram for malignant neoplasm of breast: Secondary | ICD-10-CM

## 2023-02-27 DIAGNOSIS — Z1211 Encounter for screening for malignant neoplasm of colon: Secondary | ICD-10-CM

## 2023-02-27 MED ORDER — ROSUVASTATIN CALCIUM 10 MG PO TABS
10.0000 mg | ORAL_TABLET | Freq: Every day | ORAL | 3 refills | Status: DC
Start: 2023-02-27 — End: 2024-01-11

## 2023-02-27 MED ORDER — TRAZODONE HCL 50 MG PO TABS
25.0000 mg | ORAL_TABLET | Freq: Every evening | ORAL | 3 refills | Status: DC | PRN
Start: 2023-02-27 — End: 2023-08-15

## 2023-02-27 MED ORDER — PROMETHAZINE-DM 6.25-15 MG/5ML PO SYRP
2.5000 mL | ORAL_SOLUTION | Freq: Every evening | ORAL | 0 refills | Status: DC | PRN
Start: 2023-02-27 — End: 2023-03-24

## 2023-02-27 NOTE — Progress Notes (Signed)
Date:  02/27/2023   Name:  Amanda Liu   DOB:  March 06, 1956   MRN:  [id number]   Chief Complaint: Establish Care, Sinus Problem (Pt was sick 6 weeks ago and still having symtpoms, coughing up clear mucous, took 3 covid test- negative ), and Insomnia  Sinus Problem This is a recurrent problem. Episode onset: 6 weeks. The problem is unchanged. There has been no fever. Her pain is at a severity of 0/10. She is experiencing no pain. Associated symptoms include congestion, coughing and sinus pressure. Pertinent negatives include no ear pain or shortness of breath. Treatments tried: delsym and cough drops. The treatment provided mild relief.  Insomnia Primary symptoms: fragmented sleep, difficulty falling asleep, frequent awakening, malaise/fatigue.   The current episode started more than one year. The onset quality is gradual. The problem occurs every several days. The problem has been gradually worsening since onset. The symptoms are aggravated by caffeine and family issues. How many beverages per day that contain caffeine: 2-3.  Types of beverages you drink: tea and coffee. Nothing relieves the symptoms. Past treatments include medication (trazodone in the past). The treatment provided mild relief. Typical bedtime:  8-10 P.M..  Nighttime awakenings: varies sometimes 5 mins sometimes 1 hour.   PMH includes: family stress or anxiety.    Amanda Liu is a very pleasant 67 year old female with history of HTN, HLD, insomnia, and distant left breast cancer new to the practice today to establish care and also discuss recent URI 6 weeks ago with residual symptoms but slowly improving.  Still endorses "dry upper cough", sinus congestion, postnasal drip.  A friend was sick around the same time and had COVID, but the patient had 3 negative COVID tests.  History of allergic rhinitis, currently using "generic 24-hour allergy" but unsure of the name, and only takes it some days.  Historically struggles with sleep,  trazodone 25 mg has worked well for her in the past, uses it as needed.  Requesting refill on her rosuvastatin.  Would also like mammogram and Cologuard ordered.  Last mammo 02/23/2021 - normal. No prior CRC screen of any kind per patient.    Medication list has been reviewed and updated.  Current Meds  Medication Sig   amLODipine (NORVASC) 5 MG tablet TAKE 1 TABLET EVERY DAY   Glucosamine 500 MG CAPS Take by mouth.   hydrochlorothiazide (HYDRODIURIL) 25 MG tablet TAKE 1 TABLET EVERY DAY   lisinopril (ZESTRIL) 10 MG tablet TAKE 1 TABLET EVERY DAY   [DISCONTINUED] rosuvastatin (CRESTOR) 10 MG tablet TAKE 1 TABLET EVERY DAY     Review of Systems  Constitutional:  Positive for malaise/fatigue. Negative for fever.  HENT:  Positive for congestion, postnasal drip and sinus pressure. Negative for ear pain, rhinorrhea and sinus pain.   Eyes:  Positive for redness.  Respiratory:  Positive for cough. Negative for chest tightness, shortness of breath and wheezing.   Cardiovascular:  Negative for chest pain and palpitations.  Gastrointestinal:  Negative for abdominal pain.  Psychiatric/Behavioral:  The patient has insomnia.     Patient Active Problem List   Diagnosis Date Noted   Hyperlipidemia 06/08/2022   Low back pain 06/08/2022   Bilateral knee pain 10/18/2021   Prediabetes 06/06/2018   Insomnia 03/03/2018   Bilateral carpal tunnel syndrome 10/31/2017   Tremor 02/17/2016   Allergic rhinitis 02/17/2016   Essential hypertension 11/17/2015   History of left breast cancer 11/17/2015   Stress incontinence 11/17/2015   Osteopenia 10/28/2014  No Known Allergies  Immunization History  Administered Date(s) Administered   Influenza,inj,Quad PF,6+ Mos 02/25/2015, 03/18/2016, 05/01/2017, 02/20/2018    Past Surgical History:  Procedure Laterality Date   ABDOMINAL HYSTERECTOMY     BREAST BIOPSY Left 07/2011   left breast invasive mammary carcinoma   BREAST LUMPECTOMY Left  03/16/2012   invasive mammary carcinoma: invasive ductal carcinoma. clear margins after reexcision. 5 LN positive for macrometastasis   TONSILLECTOMY      Social History   Tobacco Use   Smoking status: Former    Types: Cigarettes   Smokeless tobacco: Never   Tobacco comments:    Smoked for 10 years- smoked 0.25  Vaping Use   Vaping status: Never Used  Substance Use Topics   Alcohol use: No    Alcohol/week: 0.0 standard drinks of alcohol   Drug use: No    Family History  Problem Relation Age of Onset   Alcohol abuse Father    Breast cancer Sister 33   Post-traumatic stress disorder Daughter        mental/emotional abuse   Eating disorder Daughter    Stroke Maternal Grandmother    Breast cancer Maternal Grandmother 6   Alcoholism Other        Parent        02/27/2023    8:54 AM 06/08/2022    8:54 AM  GAD 7 : Generalized Anxiety Score  Nervous, Anxious, on Edge 0 0  Control/stop worrying 0 0  Worry too much - different things 0 0  Trouble relaxing 0 0  Restless 0 0  Easily annoyed or irritable 0 0  Afraid - awful might happen 0 0  Total GAD 7 Score 0 0  Anxiety Difficulty Not difficult at all Not difficult at all       02/27/2023    8:54 AM 06/08/2022    8:54 AM 03/09/2022    9:14 AM  Depression screen PHQ 2/9  Decreased Interest 0 0 0  Down, Depressed, Hopeless 0 0 0  PHQ - 2 Score 0 0 0  Altered sleeping 2    Tired, decreased energy 2    Change in appetite 2    Feeling bad or failure about yourself  0    Trouble concentrating 0    Moving slowly or fidgety/restless 0    Suicidal thoughts 0    PHQ-9 Score 6    Difficult doing work/chores Not difficult at all      BP Readings from Last 3 Encounters:  02/27/23 118/78  01/18/23 130/86  06/08/22 120/80    Wt Readings from Last 3 Encounters:  02/27/23 173 lb (78.5 kg)  06/08/22 152 lb (68.9 kg)  03/09/22 178 lb (80.7 kg)    BP 118/78 (BP Location: Right Arm, Patient Position: Sitting, Cuff Size:  Large)   Pulse 75   Temp 98.1 F (36.7 C) (Oral)   Ht 5\' 5"  (1.651 m)   Wt 173 lb (78.5 kg)   SpO2 99%   BMI 28.79 kg/m   Physical Exam Vitals and nursing note reviewed.  Constitutional:      General: She is not in acute distress.    Appearance: Normal appearance.  HENT:     Right Ear: Tympanic membrane normal.     Left Ear: Tympanic membrane normal.     Ears:     Comments: EAC clear bilaterally with good view of TM which is without effusion or erythema.     Nose: Nose normal.  Comments: Sinuses nontender    Mouth/Throat:     Mouth: Mucous membranes are moist.     Pharynx: No oropharyngeal exudate or posterior oropharyngeal erythema.  Eyes:     Conjunctiva/sclera: Conjunctivae normal.     Pupils: Pupils are equal, round, and reactive to light.  Cardiovascular:     Rate and Rhythm: Normal rate and regular rhythm.     Heart sounds: No murmur heard.    No friction rub. No gallop.  Pulmonary:     Effort: Pulmonary effort is normal.     Breath sounds: Normal breath sounds. No wheezing, rhonchi or rales.  Lymphadenopathy:     Cervical: No cervical adenopathy.     Recent Labs     Component Value Date/Time   NA 138 06/08/2022 0909   NA 134 (L) 08/27/2014 1127   K 3.7 06/08/2022 0909   K 3.3 (L) 08/27/2014 1127   CL 100 06/08/2022 0909   CL 101 08/27/2014 1127   CO2 26 06/08/2022 0909   CO2 25 08/27/2014 1127   GLUCOSE 105 (H) 06/08/2022 0909   GLUCOSE 140 (H) 08/27/2014 1127   BUN 11 06/08/2022 0909   BUN 13 08/27/2014 1127   CREATININE 0.64 06/08/2022 0909   CREATININE 0.58 08/27/2014 1127   CALCIUM 10.0 06/08/2022 0909   CALCIUM 9.4 08/27/2014 1127   PROT 6.9 01/21/2022 0807   PROT 7.5 08/27/2014 1127   ALBUMIN 4.7 01/21/2022 0807   ALBUMIN 4.7 08/27/2014 1127   AST 17 01/21/2022 0807   AST 24 08/27/2014 1127   ALT 14 01/21/2022 0807   ALT 19 08/27/2014 1127   ALKPHOS 41 01/21/2022 0807   ALKPHOS 53 08/27/2014 1127   BILITOT 0.5 01/21/2022 0807    BILITOT 0.6 08/27/2014 1127   GFRNONAA >60 06/01/2017 0848   GFRNONAA >60 08/27/2014 1127   GFRAA >60 06/01/2017 0848   GFRAA >60 08/27/2014 1127    Lab Results  Component Value Date   WBC 6.6 06/01/2017   HGB 14.3 06/01/2017   HCT 41.5 06/01/2017   MCV 95.1 06/01/2017   PLT 202 06/01/2017   Lab Results  Component Value Date   HGBA1C 6.3 06/08/2022   Lab Results  Component Value Date   CHOL 202 (H) 10/18/2021   HDL 63.40 10/18/2021   LDLCALC 116 (H) 10/18/2021   LDLDIRECT 46.0 01/21/2022   TRIG 111.0 10/18/2021   CHOLHDL 3 10/18/2021   Lab Results  Component Value Date   TSH 2.34 10/30/2017     Assessment and Plan:  1. Persistent cough for 3 weeks or longer Possible recovering bronchitis versus allergic rhinitis.  Refill Promethazine DM for use as needed at night.  Seems to be gradually improving.  No indication for further antibiotics.  Lung exam very reassuring - promethazine-dextromethorphan (PROMETHAZINE-DM) 6.25-15 MG/5ML syrup; Take 2.5 mLs by mouth at bedtime as needed for cough.  Dispense: 118 mL; Refill: 0  2. Allergic rhinitis, unspecified seasonality, unspecified trigger Patient advised to take her current antihistamine daily for the next week.  If no improvement, I have given her samples of both Xyzal and Allegra to try.  She says she does not like any of the nasal sprays.  3. Insomnia, unspecified type Refill trazodone as below - traZODone (DESYREL) 50 MG tablet; Take 0.5 tablets (25 mg total) by mouth at bedtime as needed for sleep.  Dispense: 30 tablet; Refill: 3  4. Hyperlipidemia, unspecified hyperlipidemia type Refill rosuvastatin as below - rosuvastatin (CRESTOR) 10 MG tablet; Take 1 tablet (  10 mg total) by mouth daily.  Dispense: 90 tablet; Refill: 3  5. Screening for colon cancer Ordering Cologuard for initial CRC screen - Cologuard  6. Encounter for screening mammogram for breast cancer Last mammogram 2 years ago, due for follow-up.   Patient advised to schedule with [facility] - MM 3D SCREENING MAMMOGRAM BILATERAL BREAST   Return in about 4 weeks (around 03/27/2023) for OV f/u cough, fatigue.    Alvester Morin, PA-C, DMSc, Nutritionist [facility] [phone]

## 2023-02-27 NOTE — Patient Instructions (Signed)
-

## 2023-03-24 ENCOUNTER — Ambulatory Visit: Payer: Self-pay | Admitting: *Deleted

## 2023-03-24 ENCOUNTER — Encounter: Payer: Self-pay | Admitting: Physician Assistant

## 2023-03-24 ENCOUNTER — Telehealth (INDEPENDENT_AMBULATORY_CARE_PROVIDER_SITE_OTHER): Payer: Medicare HMO | Admitting: Physician Assistant

## 2023-03-24 DIAGNOSIS — R509 Fever, unspecified: Secondary | ICD-10-CM

## 2023-03-24 DIAGNOSIS — R059 Cough, unspecified: Secondary | ICD-10-CM

## 2023-03-24 MED ORDER — DOXYCYCLINE HYCLATE 100 MG PO TABS
100.0000 mg | ORAL_TABLET | Freq: Two times a day (BID) | ORAL | 0 refills | Status: AC
Start: 2023-03-24 — End: 2023-03-29

## 2023-03-24 NOTE — Telephone Encounter (Signed)
  Chief Complaint: sinus drainage, persistent cough- mostly at night- sore chest, low grade temperature Symptoms: see above Frequency: ongoing Pertinent Negatives: Patient denies SOB, wheezing Disposition: [] ED /[] Urgent Care (no appt availability in office) / [x] Appointment(In office/virtual)/ []  [facility]/ [] Home Care/ [] Refused Recommended Disposition /[] [facility]/ []  Follow-up with PCP Additional Notes: Follow up appointment with provider scheduled- VV for evaluation.   Reason for Disposition  [1] Nasal allergies AND [2] only certain times of year AND [3] hay fever diagnosis has never been confirmed by a doctor (or NP/PA)  Answer Assessment - Initial Assessment Questions 1. SYMPTOM: "What's the main symptom you're concerned about?" (e.g., runny nose, stuffiness, sneezing, itching)     Runny nose, low grade temperature,cough, sinus drainage 2. SEVERITY: "How bad is it?" "What does it keep you from doing?" (e.g., sleeping, working)      Cough makes patient sore 3. EYES: "Are the eyes also red, watery, and itchy?"      Running water 4. TRIGGER: "What pollen or other allergic substance do you think is causing the symptoms?"      Not sure- in and out a lot yesterday 5. TREATMENT: "What medicine are you using?" "What medicine worked best in the past?"     Cough medication-promethazine DM,  [facility] allergy relief, ibuprofen 6. OTHER SYMPTOMS: "Do you have any other symptoms?" (e.g., coughing, difficulty breathing, wheezing)     Soreness from cough  Protocols used: Nasal Allergies (Hay Fever)-A-AH

## 2023-03-24 NOTE — Patient Instructions (Signed)
-  It was a pleasure to see you today! Please review your visit summary for helpful information -I would encourage you to follow your care via MyChart where you can access lab results, notes, messages, and more -If you feel that we did a nice job today, please complete your after-visit survey and leave [place] a Google review! Your CMA today was Chassidy and your provider was Alvester Morin, PA-C, DMSc -Please return for follow-up in about 4wk

## 2023-03-24 NOTE — Telephone Encounter (Signed)
Message from Lynden C sent at 03/24/2023  8:23 AM EDT  Summary: sinus concern   The patient was experiencing no difficulty breathing or shortness of breath at the time of call  ----- Message from [facility] C sent at 03/24/2023  8:22 AM EDT ----- The patient would like to speak with a member of staff when possible about the congestion and sinus discomfort and elevated temperature that they've been experiencing for roughly two days  The patient shares that they have a previous history of respiratory infections  Please contact further when possible          Call History  Contact Date/Time Type Contact Phone/Fax User  03/24/2023 08:18 AM EDT Phone (Incoming) Amanda Liu, Amanda Liu (Self) [phone] Rexene Edison) Coley, Everette A

## 2023-03-24 NOTE — Progress Notes (Signed)
Date:  03/24/2023   Name:  Amanda Liu   DOB:  [birth year]   MRN:  [id number]   I connected with Elvin So on 03/24/23 via MyChart Video and verified that I am speaking with the correct person using appropriate identifiers. The limitations, risks, security and privacy concerns of performing an evaluation and management service by MyChart Video, including the higher likelihood of inaccurate diagnoses and treatments, and the availability of in person appointments were reviewed. The possible need of an additional face-to-face encounter for complete and high quality delivery of care was discussed. The patient was also made aware that there may be a patient responsible charge related to this service. The patient expressed understanding and wishes to proceed.   Provider location is in medical facility [facility]). Patient location is at their home People involved in care of the patient during this telehealth encounter were myself, my CMA, and my front office/scheduling team member.    Chief Complaint: Cough (Started over a month ago. Patient has cough with congestion. Was told at last visit that its coming from sinus drainage. Cough is not frequent during the day, but its worse in the afternoon and when laying down at night. Last two nights patient had low grade fever around 99.8. She had sweats.)  HPI Amanda Liu presents today via telehealth for symptoms as above.  She clarifies with me that the persistent cough addressed last visit 1 month ago was actually improving in recent weeks, until 2 to 3 days ago when she started to feel poorly and developed this cough, worse at night and associated with low-grade fever.  No sick contacts to her knowledge.  Still has some cough syrup leftover from last time if needed.  She is taking some type of 24-hour antihistamine as well without significant relief.  Symptoms have been stable for the last 24  hours.   Medication list has been reviewed and updated.  Current Meds  Medication Sig   amLODipine (NORVASC) 5 MG tablet TAKE 1 TABLET EVERY DAY   Calcium 200 MG TABS Take by mouth.   doxycycline (VIBRA-TABS) 100 MG tablet Take 1 tablet (100 mg total) by mouth 2 (two) times daily for 5 days. Do not take with dairy. This medication INCREASES SUN SENSITIVITY so avoid direct sunlight.   Glucosamine 500 MG CAPS Take by mouth.   hydrochlorothiazide (HYDRODIURIL) 25 MG tablet TAKE 1 TABLET EVERY DAY   lisinopril (ZESTRIL) 10 MG tablet TAKE 1 TABLET EVERY DAY   rosuvastatin (CRESTOR) 10 MG tablet Take 1 tablet (10 mg total) by mouth daily.   traZODone (DESYREL) 50 MG tablet Take 0.5 tablets (25 mg total) by mouth at bedtime as needed for sleep.   [DISCONTINUED] promethazine-dextromethorphan (PROMETHAZINE-DM) 6.25-15 MG/5ML syrup Take 2.5 mLs by mouth at bedtime as needed for cough.     Review of Systems  Constitutional:  Positive for fatigue and fever.  HENT:  Positive for congestion, ear pain and postnasal drip.   Eyes:  Positive for discharge.  Respiratory:  Positive for cough. Negative for chest tightness and shortness of breath.   Cardiovascular:  Negative for chest pain and palpitations.  Gastrointestinal:  Negative for abdominal pain.    Patient Active Problem List   Diagnosis Date Noted   Hyperlipidemia 06/08/2022   Low back pain 06/08/2022   Bilateral knee pain 10/18/2021   Prediabetes 06/06/2018   Insomnia 03/03/2018   Bilateral carpal tunnel syndrome 10/31/2017  Tremor 02/17/2016   Allergic rhinitis 02/17/2016   Essential hypertension 11/17/2015   History of left breast cancer 11/17/2015   Stress incontinence 11/17/2015   Osteopenia 10/28/2014    No Known Allergies  Immunization History  Administered Date(s) Administered   Influenza,inj,Quad PF,6+ Mos 02/25/2015, 03/18/2016, 05/01/2017, 02/20/2018    Past Surgical History:  Procedure Laterality Date    ABDOMINAL HYSTERECTOMY     BREAST BIOPSY Left 07/2011   left breast invasive mammary carcinoma   BREAST LUMPECTOMY Left 03/16/2012   invasive mammary carcinoma: invasive ductal carcinoma. clear margins after reexcision. 5 LN positive for macrometastasis   TONSILLECTOMY      Social History   Tobacco Use   Smoking status: Former    Types: Cigarettes   Smokeless tobacco: Never   Tobacco comments:    Smoked for 10 years- smoked 0.25  Vaping Use   Vaping status: Never Used  Substance Use Topics   Alcohol use: No    Alcohol/week: 0.0 standard drinks of alcohol   Drug use: No    Family History  Problem Relation Age of Onset   Alcohol abuse Father    Breast cancer Sister 23   Post-traumatic stress disorder Daughter        mental/emotional abuse   Eating disorder Daughter    Stroke Maternal Grandmother    Breast cancer Maternal Grandmother 55   Alcoholism Other        Parent        02/27/2023    8:54 AM 06/08/2022    8:54 AM  GAD 7 : Generalized Anxiety Score  Nervous, Anxious, on Edge 0 0  Control/stop worrying 0 0  Worry too much - different things 0 0  Trouble relaxing 0 0  Restless 0 0  Easily annoyed or irritable 0 0  Afraid - awful might happen 0 0  Total GAD 7 Score 0 0  Anxiety Difficulty Not difficult at all Not difficult at all       02/27/2023    8:54 AM 06/08/2022    8:54 AM 03/09/2022    9:14 AM  Depression screen PHQ 2/9  Decreased Interest 0 0 0  Down, Depressed, Hopeless 0 0 0  PHQ - 2 Score 0 0 0  Altered sleeping 2    Tired, decreased energy 2    Change in appetite 2    Feeling bad or failure about yourself  0    Trouble concentrating 0    Moving slowly or fidgety/restless 0    Suicidal thoughts 0    PHQ-9 Score 6    Difficult doing work/chores Not difficult at all      BP Readings from Last 3 Encounters:  02/27/23 118/78  01/18/23 130/86  06/08/22 120/80    Wt Readings from Last 3 Encounters:  02/27/23 173 lb (78.5 kg)  06/08/22  152 lb (68.9 kg)  03/09/22 178 lb (80.7 kg)    There were no vitals taken for this visit.  Physical Exam General: Speaking full sentences, no audible heavy breathing.  Cough not appreciated during our visit.  Sounds alert and appropriately interactive. Well-appearing. Face symmetric. Extraocular movements intact. Pupils equal and round. No nasal flaring or accessory muscle use visualized.  Recent Labs     Component Value Date/Time   NA 138 06/08/2022 0909   NA 134 (L) 08/27/2014 1127   K 3.7 06/08/2022 0909   K 3.3 (L) 08/27/2014 1127   CL 100 06/08/2022 0909   CL 101 08/27/2014 1127  CO2 26 06/08/2022 0909   CO2 25 08/27/2014 1127   GLUCOSE 105 (H) 06/08/2022 0909   GLUCOSE 140 (H) 08/27/2014 1127   BUN 11 06/08/2022 0909   BUN 13 08/27/2014 1127   CREATININE 0.64 06/08/2022 0909   CREATININE 0.58 08/27/2014 1127   CALCIUM 10.0 06/08/2022 0909   CALCIUM 9.4 08/27/2014 1127   PROT 6.9 01/21/2022 0807   PROT 7.5 08/27/2014 1127   ALBUMIN 4.7 01/21/2022 0807   ALBUMIN 4.7 08/27/2014 1127   AST 17 01/21/2022 0807   AST 24 08/27/2014 1127   ALT 14 01/21/2022 0807   ALT 19 08/27/2014 1127   ALKPHOS 41 01/21/2022 0807   ALKPHOS 53 08/27/2014 1127   BILITOT 0.5 01/21/2022 0807   BILITOT 0.6 08/27/2014 1127   GFRNONAA >60 06/01/2017 0848   GFRNONAA >60 08/27/2014 1127   GFRAA >60 06/01/2017 0848   GFRAA >60 08/27/2014 1127    Lab Results  Component Value Date   WBC 6.6 06/01/2017   HGB 14.3 06/01/2017   HCT 41.5 06/01/2017   MCV 95.1 06/01/2017   PLT 202 06/01/2017   Lab Results  Component Value Date   HGBA1C 6.3 06/08/2022   Lab Results  Component Value Date   CHOL 202 (H) 10/18/2021   HDL 63.40 10/18/2021   LDLCALC 116 (H) 10/18/2021   LDLDIRECT 46.0 01/21/2022   TRIG 111.0 10/18/2021   CHOLHDL 3 10/18/2021   Lab Results  Component Value Date   TSH 2.34 10/30/2017     Assessment and Plan:  1. Cough with fever Discussed with patient probable viral  etiology.  Discussed self-limited nature of viral illnesses and advised conservative measures including rest, fluids, honey, and OTC cough/cold medications.   That said, given her recent extended bout with bronchitis, I am prescribing doxycycline for her today in the event that symptoms show no improvement or worsen over the next 24 hours.  I have instructed her to wait until tomorrow before picking up the antibiotic.  If things seem to be improving, she should forego using the doxycycline.  Contact precautions advised to limit spread. Encouraged mask wearing and good hand hygiene especially before meals. Call if acutely worsening symptoms.  - doxycycline (VIBRA-TABS) 100 MG tablet; Take 1 tablet (100 mg total) by mouth 2 (two) times daily for 5 days. Do not take with dairy. This medication INCREASES SUN SENSITIVITY so avoid direct sunlight.  Dispense: 10 tablet; Refill: 0    Return in about 4 weeks (around 04/21/2023) for OV cough, fatigue.   I discussed the above assessment and treatment plan with the patient. The patient was provided an opportunity to ask questions and all were answered. The patient agreed with the plan and demonstrated an understanding of the instructions. The patient was advised to call back or seek an in-person evaluation if the symptoms worsen or if the condition fails to improve as anticipated. I provided a total time of 16 minutes inclusive of time utilized for medical chart review, information gathering, care coordination with staff, and documentation completion.  Alvester Morin, PA-C, DMSc, Nutritionist [facility] [phone]

## 2023-03-24 NOTE — Telephone Encounter (Signed)
Attempted to return her call.   Left a voicemail to call back to discuss symptoms with a nurse.

## 2023-04-04 ENCOUNTER — Ambulatory Visit: Payer: Medicare HMO | Admitting: Physician Assistant

## 2023-04-20 ENCOUNTER — Ambulatory Visit: Payer: Medicare HMO | Admitting: Physician Assistant

## 2023-05-04 ENCOUNTER — Ambulatory Visit: Payer: Medicare HMO | Admitting: Physician Assistant

## 2023-06-05 ENCOUNTER — Ambulatory Visit: Payer: Medicare HMO | Admitting: Physician Assistant

## 2023-06-07 ENCOUNTER — Ambulatory Visit
Admission: RE | Admit: 2023-06-07 | Discharge: 2023-06-07 | Disposition: A | Discharge status: Home / Self Care | Payer: Medicare HMO | Source: Ambulatory Visit | Attending: Physician Assistant | Admitting: Physician Assistant

## 2023-06-07 ENCOUNTER — Other Ambulatory Visit: Payer: Self-pay | Admitting: Physician Assistant

## 2023-06-07 DIAGNOSIS — Z1231 Encounter for screening mammogram for malignant neoplasm of breast: Secondary | ICD-10-CM | POA: Insufficient documentation

## 2023-06-07 DIAGNOSIS — R921 Mammographic calcification found on diagnostic imaging of breast: Secondary | ICD-10-CM

## 2023-06-07 DIAGNOSIS — R928 Other abnormal and inconclusive findings on diagnostic imaging of breast: Secondary | ICD-10-CM

## 2023-06-12 ENCOUNTER — Ambulatory Visit
Admission: RE | Admit: 2023-06-12 | Discharge: 2023-06-12 | Disposition: A | Discharge status: Home / Self Care | Payer: Medicare HMO | Source: Ambulatory Visit | Attending: Physician Assistant | Admitting: Physician Assistant

## 2023-06-12 DIAGNOSIS — R928 Other abnormal and inconclusive findings on diagnostic imaging of breast: Secondary | ICD-10-CM | POA: Diagnosis present

## 2023-06-12 DIAGNOSIS — R921 Mammographic calcification found on diagnostic imaging of breast: Secondary | ICD-10-CM | POA: Insufficient documentation

## 2023-06-13 ENCOUNTER — Encounter: Payer: Self-pay | Admitting: Physician Assistant

## 2023-06-13 ENCOUNTER — Ambulatory Visit (INDEPENDENT_AMBULATORY_CARE_PROVIDER_SITE_OTHER): Payer: Medicare HMO | Admitting: Physician Assistant

## 2023-06-13 VITALS — BP 122/76 | HR 77 | Temp 97.7°F | Ht 65.0 in | Wt 165.0 lb

## 2023-06-13 DIAGNOSIS — L989 Disorder of the skin and subcutaneous tissue, unspecified: Secondary | ICD-10-CM | POA: Diagnosis not present

## 2023-06-13 DIAGNOSIS — E785 Hyperlipidemia, unspecified: Secondary | ICD-10-CM | POA: Diagnosis not present

## 2023-06-13 DIAGNOSIS — R634 Abnormal weight loss: Secondary | ICD-10-CM

## 2023-06-13 DIAGNOSIS — R7303 Prediabetes: Secondary | ICD-10-CM

## 2023-06-13 DIAGNOSIS — M8580 Other specified disorders of bone density and structure, unspecified site: Secondary | ICD-10-CM

## 2023-06-13 NOTE — Progress Notes (Signed)
 Date:  06/13/2023   Name:  Amanda Liu   DOB:  06/29/1955   MRN:  [id number]   Chief Complaint: Cough, Weight Loss (Losing weight without trying - down 8 pounds, appetite is not the same /), and Anxiety (Overly anxious lately )  HPI Chisa presents today for evaluation of recent weight loss, low appetite, and anxiety.  She endorses recent stressors over the last several months with persistent cough (finally resolved), abnormal mammogram finding (resolved on diagnostic mammo), and some personal stressors as well.  She has known osteopenia, overdue for DEXA.  She also mentions a persistent itchy lesion of the chest the last several months which she had brought to my attention at her previous visit.  Does not seem to be resolving with Neosporin and bandaging.  Medication list has been reviewed and updated.  Current Meds  Medication Sig   amLODipine  (NORVASC ) 5 MG tablet TAKE 1 TABLET EVERY DAY   Calcium  200 MG TABS Take by mouth.   Glucosamine 500 MG CAPS Take by mouth.   hydrochlorothiazide  (HYDRODIURIL ) 25 MG tablet TAKE 1 TABLET EVERY DAY   lisinopril  (ZESTRIL ) 10 MG tablet TAKE 1 TABLET EVERY DAY   rosuvastatin  (CRESTOR ) 10 MG tablet Take 1 tablet (10 mg total) by mouth daily.   traZODone  (DESYREL ) 50 MG tablet Take 0.5 tablets (25 mg total) by mouth at bedtime as needed for sleep.     Review of Systems  Patient Active Problem List   Diagnosis Date Noted   Hyperlipidemia 06/08/2022   Low back pain 06/08/2022   Bilateral knee pain 10/18/2021   Prediabetes 06/06/2018   Insomnia 03/03/2018   Bilateral carpal tunnel syndrome 10/31/2017   Tremor 02/17/2016   Allergic rhinitis 02/17/2016   Essential hypertension 11/17/2015   History of left breast cancer 11/17/2015   Stress incontinence 11/17/2015   Osteopenia 10/28/2014    No Known Allergies  Immunization History  Administered Date(s) Administered   Influenza,inj,Quad PF,6+ Mos 02/25/2015, 03/18/2016, 05/01/2017,  02/20/2018    Past Surgical History:  Procedure Laterality Date   ABDOMINAL HYSTERECTOMY     BREAST BIOPSY Left 07/2011   left breast invasive mammary carcinoma   BREAST LUMPECTOMY Left 03/16/2012   invasive mammary carcinoma: invasive ductal carcinoma. clear margins after reexcision. 5 LN positive for macrometastasis   TONSILLECTOMY      Social History   Tobacco Use   Smoking status: Former    Types: Cigarettes   Smokeless tobacco: Never   Tobacco comments:    Smoked for 10 years- smoked 0.25  Vaping Use   Vaping status: Never Used  Substance Use Topics   Alcohol use: No    Alcohol/week: 0.0 standard drinks of alcohol   Drug use: No    Family History  Problem Relation Age of Onset   Alcohol abuse Father    Breast cancer Sister 35   Post-traumatic stress disorder Daughter        mental/emotional abuse   Eating disorder Daughter    Stroke Maternal Grandmother    Breast cancer Maternal Grandmother 26   Alcoholism Other        Parent        06/13/2023    2:42 PM 02/27/2023    8:54 AM 06/08/2022    8:54 AM  GAD 7 : Generalized Anxiety Score  Nervous, Anxious, on Edge 3 0 0  Control/stop worrying 3 0 0  Worry too much - different things 0 0 0  Trouble relaxing 3 0  0  Restless 3 0 0  Easily annoyed or irritable 0 0 0  Afraid - awful might happen 0 0 0  Total GAD 7 Score 12 0 0  Anxiety Difficulty Somewhat difficult Not difficult at all Not difficult at all       06/13/2023    2:42 PM 02/27/2023    8:54 AM 06/08/2022    8:54 AM  Depression screen PHQ 2/9  Decreased Interest 0 0 0  Down, Depressed, Hopeless 0 0 0  PHQ - 2 Score 0 0 0  Altered sleeping  2   Tired, decreased energy  2   Change in appetite  2   Feeling bad or failure about yourself   0   Trouble concentrating  0   Moving slowly or fidgety/restless  0   Suicidal thoughts  0   PHQ-9 Score  6   Difficult doing work/chores  Not difficult at all     BP Readings from Last 3 Encounters:   06/13/23 122/76  02/27/23 118/78  01/18/23 130/86    Wt Readings from Last 3 Encounters:  06/13/23 165 lb (74.8 kg)  02/27/23 173 lb (78.5 kg)  06/08/22 152 lb (68.9 kg)    BP 122/76   Pulse 77   Temp 97.7 F (36.5 C)   Ht 5' 5 (1.651 m)   Wt 165 lb (74.8 kg)   SpO2 96%   BMI 27.46 kg/m   Physical Exam Vitals and nursing note reviewed.  Constitutional:      Appearance: Normal appearance.  Cardiovascular:     Rate and Rhythm: Normal rate and regular rhythm.     Heart sounds: No murmur heard.    No friction rub. No gallop.  Pulmonary:     Effort: Pulmonary effort is normal.     Breath sounds: Normal breath sounds.  Abdominal:     General: There is no distension.  Musculoskeletal:        General: Normal range of motion.  Skin:    General: Skin is warm and dry.       Neurological:     Mental Status: She is alert and oriented to person, place, and time.     Gait: Gait is intact.  Psychiatric:        Mood and Affect: Mood and affect normal.     Recent Labs     Component Value Date/Time   NA 138 06/08/2022 0909   NA 134 (L) 08/27/2014 1127   K 3.7 06/08/2022 0909   K 3.3 (L) 08/27/2014 1127   CL 100 06/08/2022 0909   CL 101 08/27/2014 1127   CO2 26 06/08/2022 0909   CO2 25 08/27/2014 1127   GLUCOSE 105 (H) 06/08/2022 0909   GLUCOSE 140 (H) 08/27/2014 1127   BUN 11 06/08/2022 0909   BUN 13 08/27/2014 1127   CREATININE 0.64 06/08/2022 0909   CREATININE 0.58 08/27/2014 1127   CALCIUM  10.0 06/08/2022 0909   CALCIUM  9.4 08/27/2014 1127   PROT 6.9 01/21/2022 0807   PROT 7.5 08/27/2014 1127   ALBUMIN 4.7 01/21/2022 0807   ALBUMIN 4.7 08/27/2014 1127   AST 17 01/21/2022 0807   AST 24 08/27/2014 1127   ALT 14 01/21/2022 0807   ALT 19 08/27/2014 1127   ALKPHOS 41 01/21/2022 0807   ALKPHOS 53 08/27/2014 1127   BILITOT 0.5 01/21/2022 0807   BILITOT 0.6 08/27/2014 1127   GFRNONAA >60 06/01/2017 0848   GFRNONAA >60 08/27/2014 1127   GFRAA >60  06/01/2017  0848   GFRAA >60 08/27/2014 1127    Lab Results  Component Value Date   WBC 6.6 06/01/2017   HGB 14.3 06/01/2017   HCT 41.5 06/01/2017   MCV 95.1 06/01/2017   PLT 202 06/01/2017   Lab Results  Component Value Date   HGBA1C 6.3 06/08/2022   Lab Results  Component Value Date   CHOL 202 (H) 10/18/2021   HDL 63.40 10/18/2021   LDLCALC 116 (H) 10/18/2021   LDLDIRECT 46.0 01/21/2022   TRIG 111.0 10/18/2021   CHOLHDL 3 10/18/2021   Lab Results  Component Value Date   TSH 2.34 10/30/2017     Assessment and Plan:  1. Unintentional weight loss (Primary) Will check routine labs as below.  Nutrition shake samples given today.  Likely anxiety related, discussed options for pharmacotherapy.  I think duloxetine would be a good option for her given her neuropathic pain, but she defers any medications at this time.  She will let me know if she changes her mind. - CBC with Differential/Platelet - Comprehensive metabolic panel - TSH  2. Skin lesion of chest wall Refer to dermatology for expert opinion.  Consider applying topical cortisone cream in the meantime. - Ambulatory referral to Dermatology  3. Osteopenia, unspecified location Ordering repeat DEXA and vitamin D  today - VITAMIN D  25 Hydroxy (Vit-D Deficiency, Fractures) - DG Bone Density  4. Hyperlipidemia, unspecified hyperlipidemia type Repeat nonfasting lipids - Lipid panel  5. Prediabetes Check A1c - Hemoglobin A1c   Return in about 2 months (around 08/11/2023) for OV f/u chronic conditions.    Rolan Hoyle, PA-C, DMSc, Nutritionist [facility] [phone]

## 2023-06-13 NOTE — Patient Instructions (Signed)
-  It was a pleasure to see you today! Please review your visit summary for helpful information -Lab results are usually available within 1-2 days and we will call once reviewed -I would encourage you to follow your care via MyChart where you can access lab results, notes, messages, and more -If you feel that we did a nice job today, please complete your after-visit survey and leave us a Google review! Your CMA today was Kieandra and your provider was Dan Waddell, PA-C, DMSc -Please return for follow-up in about 2 months  

## 2023-06-14 LAB — CBC WITH DIFFERENTIAL/PLATELET
Basophils Absolute: 0 10*3/uL (ref 0.0–0.2)
Basos: 1 %
EOS (ABSOLUTE): 0.2 10*3/uL (ref 0.0–0.4)
Eos: 3 %
Hematocrit: 38.6 % (ref 34.0–46.6)
Hemoglobin: 12.3 g/dL (ref 11.1–15.9)
Immature Grans (Abs): 0 10*3/uL (ref 0.0–0.1)
Immature Granulocytes: 0 %
Lymphocytes Absolute: 1.4 10*3/uL (ref 0.7–3.1)
Lymphs: 25 %
MCH: 29.4 pg (ref 26.6–33.0)
MCHC: 31.9 g/dL (ref 31.5–35.7)
MCV: 92 fL (ref 79–97)
Monocytes Absolute: 0.5 10*3/uL (ref 0.1–0.9)
Monocytes: 8 %
Neutrophils Absolute: 3.6 10*3/uL (ref 1.4–7.0)
Neutrophils: 63 %
Platelets: 243 10*3/uL (ref 150–450)
RBC: 4.19 x10E6/uL (ref 3.77–5.28)
RDW: 14 % (ref 11.7–15.4)
WBC: 5.7 10*3/uL (ref 3.4–10.8)

## 2023-06-14 LAB — COMPREHENSIVE METABOLIC PANEL
ALT: 16 [IU]/L (ref 0–32)
AST: 21 [IU]/L (ref 0–40)
Albumin: 4.8 g/dL (ref 3.9–4.9)
Alkaline Phosphatase: 63 [IU]/L (ref 44–121)
BUN/Creatinine Ratio: 16 (ref 12–28)
BUN: 9 mg/dL (ref 8–27)
Bilirubin Total: 0.3 mg/dL (ref 0.0–1.2)
CO2: 28 mmol/L (ref 20–29)
Calcium: 9.9 mg/dL (ref 8.7–10.3)
Chloride: 95 mmol/L — ABNORMAL LOW (ref 96–106)
Creatinine, Ser: 0.58 mg/dL (ref 0.57–1.00)
Globulin, Total: 2.3 g/dL (ref 1.5–4.5)
Glucose: 95 mg/dL (ref 70–99)
Potassium: 3.9 mmol/L (ref 3.5–5.2)
Sodium: 136 mmol/L (ref 134–144)
Total Protein: 7.1 g/dL (ref 6.0–8.5)
eGFR: 99 mL/min/{1.73_m2} (ref >59)

## 2023-06-14 LAB — TSH: TSH: 3.14 u[IU]/mL (ref 0.450–4.500)

## 2023-06-14 LAB — LIPID PANEL
Chol/HDL Ratio: 1.9 {ratio} (ref 0.0–4.4)
Cholesterol, Total: 141 mg/dL (ref 100–199)
HDL: 74 mg/dL (ref >39)
LDL Chol Calc (NIH): 48 mg/dL (ref 0–99)
Triglycerides: 104 mg/dL (ref 0–149)
VLDL Cholesterol Cal: 19 mg/dL (ref 5–40)

## 2023-06-14 LAB — HEMOGLOBIN A1C
Est. average glucose Bld gHb Est-mCnc: 128 mg/dL
Hgb A1c MFr Bld: 6.1 % — ABNORMAL HIGH (ref 4.8–5.6)

## 2023-06-14 LAB — VITAMIN D 25 HYDROXY (VIT D DEFICIENCY, FRACTURES): Vit D, 25-Hydroxy: 35.6 ng/mL (ref 30.0–100.0)

## 2023-06-22 ENCOUNTER — Telehealth: Payer: Self-pay | Admitting: Physician Assistant

## 2023-06-22 NOTE — Telephone Encounter (Signed)
Copied from CRM [id number]. Topic: Medicare AWV >> Jun 22, 2023  3:18 PM Payton Doughty wrote: Reason for CRM: Called LVM 06/22/2023 to schedule AWV. Please schedule Virtual or Telehealth visits ONLY.   Verlee Rossetti; Care Guide Ambulatory Clinical Support [place] [facility] Direct Dial: [phone]

## 2023-07-11 ENCOUNTER — Ambulatory Visit
Admission: RE | Admit: 2023-07-11 | Discharge: 2023-07-11 | Disposition: A | Discharge status: Home / Self Care | Payer: Medicare HMO | Source: Ambulatory Visit | Attending: Emergency Medicine | Admitting: Emergency Medicine

## 2023-07-11 VITALS — BP 121/86 | HR 89 | Temp 97.7°F | Resp 17

## 2023-07-11 DIAGNOSIS — R21 Rash and other nonspecific skin eruption: Secondary | ICD-10-CM

## 2023-07-11 MED ORDER — HYDROXYZINE HCL 25 MG PO TABS: 25.0000 mg | ORAL_TABLET | Freq: Four times a day (QID) | ORAL | 0 refills | Status: DC

## 2023-07-11 MED ORDER — PREDNISONE 10 MG (21) PO TBPK: ORAL_TABLET | Freq: Every day | ORAL | 0 refills | Status: DC

## 2023-07-11 MED ORDER — METHYLPREDNISOLONE ACETATE 80 MG/ML IJ SUSP
60.0000 mg | Freq: Once | INTRAMUSCULAR | Status: AC
  Administered 2023-07-11: 60 mg via INTRAMUSCULAR

## 2023-07-11 NOTE — ED Provider Notes (Signed)
Renaldo Fiddler    CSN: [id number] Arrival date & time: 07/11/23  1054      History   Chief Complaint Chief Complaint  Patient presents with   Rash    Entered by patient    HPI Amanda Liu is a 68 y.o. female.   Patient presents for evaluation of a erythematous pruritic rash present to the trunk of the body of the upper and lower for 18 days.  Possibly stress-induced, lately she has had several stressful events and shortly gets new lesions after event occurs.  Has attempted use of cortisone and Benadryl which has been ineffective.  Other members of household not affected.  Denies drainage or fever.  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   Personal history of chemotherapy 08/2011   left breast ca   Personal history of radiation therapy 03/2012-05/2012   left breast ca   UTI (lower urinary tract infection)     Patient Active Problem List   Diagnosis Date Noted   Hyperlipidemia 06/08/2022   Low back pain 06/08/2022   Bilateral knee pain 10/18/2021   Insomnia 03/03/2018   Bilateral carpal tunnel syndrome 10/31/2017   Tremor 02/17/2016   Allergic rhinitis 02/17/2016   Essential hypertension 11/17/2015   History of left breast cancer 11/17/2015   Stress incontinence 11/17/2015   Osteopenia 10/28/2014    Past Surgical History:  Procedure Laterality Date   ABDOMINAL HYSTERECTOMY     BREAST BIOPSY Left 07/2011   left breast invasive mammary carcinoma   BREAST LUMPECTOMY Left 03/16/2012   invasive mammary carcinoma: invasive ductal carcinoma. clear margins after reexcision. 5 LN positive for macrometastasis   TONSILLECTOMY      OB History   No obstetric history on  file.      Home Medications    Prior to Admission medications   Medication Sig Start Date End Date Taking? Authorizing Provider  hydrOXYzine (ATARAX) 25 MG tablet Take 1 tablet (25 mg total) by mouth every 6 (six) hours. 07/11/23  Yes Menucha Dicesare R, NP  predniSONE (STERAPRED UNI-PAK 21 TAB) 10 MG (21) TBPK tablet Take by mouth daily. Take 6 tabs by mouth daily  for 1 days, then 5 tabs for 1 days, then 4 tabs for 1 days, then 3 tabs for 1 days, 2 tabs for 1 days, then 1 tab by mouth daily for 1 days 07/11/23  Yes Rosamaria Donn R, NP  amLODipine (NORVASC) 5 MG tablet TAKE 1 TABLET EVERY DAY 10/10/22   Glori Luis, MD  Calcium 200 MG TABS Take by mouth.    [provider]  Glucosamine 500 MG CAPS Take by mouth.    [provider]  hydrochlorothiazide (HYDRODIURIL) 25 MG tablet TAKE 1 TABLET EVERY DAY 10/10/22   Glori Luis, MD  lisinopril (ZESTRIL) 10 MG tablet TAKE 1 TABLET EVERY DAY 10/10/22   Glori Luis, MD  rosuvastatin (CRESTOR) 10 MG tablet Take 1 tablet (10 mg total) by mouth daily. 02/27/23   Remo Lipps, PA  traZODone (DESYREL) 50 MG tablet Take 0.5 tablets (25 mg total) by mouth at bedtime as needed for sleep. 02/27/23   Tillie Fantasia  P, PA    Family History Family History  Problem Relation Age of Onset   Alcohol abuse Father    Breast cancer Sister 61   Post-traumatic stress disorder Daughter        mental/emotional abuse   Eating disorder Daughter    Stroke Maternal Grandmother    Breast cancer Maternal Grandmother 72   Alcoholism Other        Parent    Social History Social History   Tobacco Use   Smoking status: Former    Types: Cigarettes   Smokeless tobacco: Never   Tobacco comments:    Smoked for 10 years- smoked 0.25  Vaping Use   Vaping status: Never Used  Substance Use Topics   Alcohol use: No    Alcohol/week: 0.0 standard drinks of alcohol   Drug use: No     Allergies   Patient has no known  allergies.   Review of Systems Review of Systems  Skin:  Positive for rash.     Physical Exam Triage Vital Signs ED Triage Vitals  Encounter Vitals Group     BP 07/11/23 1126 121/86     Systolic BP Percentile --      Diastolic BP Percentile --      Pulse Rate 07/11/23 1126 89     Resp 07/11/23 1126 17     Temp 07/11/23 1126 97.7 F (36.5 C)     Temp src --      SpO2 07/11/23 1126 98 %     Weight --      Height --      Head Circumference --      Peak Flow --      Pain Score 07/11/23 1114 2     Pain Loc --      Pain Education --      Exclude from Growth Chart --    No data found.  Updated Vital Signs BP 121/86   Pulse 89   Temp 97.7 F (36.5 C)   Resp 17   SpO2 98%   Visual Acuity Right Eye Distance:   Left Eye Distance:   Bilateral Distance:    Right Eye Near:   Left Eye Near:    Bilateral Near:     Physical Exam Constitutional:      Appearance: Normal appearance.  Eyes:     Extraocular Movements: Extraocular movements intact.  Pulmonary:     Effort: Pulmonary effort is normal.  Skin:    Comments: Erythematous macular lesions present to the trunk and upper and lower extremities  Neurological:     Mental Status: She is alert and oriented to person, place, and time. Mental status is at baseline.      UC Treatments / Results  Labs (all labs ordered are listed, but only abnormal results are displayed) Labs Reviewed - No data to display  EKG   Radiology No results found.  Procedures Procedures (including critical care time)  Medications Ordered in UC Medications  methylPREDNISolone acetate (DEPO-MEDROL) injection 60 mg (60 mg Intramuscular Given by Other 07/11/23 1155)    Initial Impression / Assessment and Plan / UC Course  I have reviewed the triage vital signs and the nursing notes.  Pertinent labs & imaging results that were available during my care of the patient were reviewed by me and considered in my medical decision making (see  chart for details).  Rash  Appears inflammatory, no signs of infection, discussed, methylprednisolone injection given and prescribed prednisone taper  as well as hydroxyzine for outpatient use.  Advised against long exposure to heat to prevent further irritation advised follow-up with urgent care or PCP for further evaluation Final Clinical Impressions(s) / UC Diagnoses   Final diagnoses:  Rash     Discharge Instructions      Today you are being treated for the rash, appears inflammatory, no signs of infection  You have been given an injection of steroids today in the office today to help reduce the inflammatory process that occurs with this rash which will help minimize your itching as well as begin to clear  Starting tomorrow take prednisone every morning with food as directed, to continue the above process  May use Hydroxyzine every as needed to help with itching  You may continue use of topical calamine or Benadryl cream to help manage itching, you may also continue oral Benadryl  Please avoid long exposures to heat such as a hot steamy shower or being outside as this may cause further irritation to your rash  You may follow-up with his urgent care as needed if symptoms persist or worsen    ED Prescriptions     Medication Sig Dispense Auth. Provider   predniSONE (STERAPRED UNI-PAK 21 TAB) 10 MG (21) TBPK tablet Take by mouth daily. Take 6 tabs by mouth daily  for 1 days, then 5 tabs for 1 days, then 4 tabs for 1 days, then 3 tabs for 1 days, 2 tabs for 1 days, then 1 tab by mouth daily for 1 days 21 tablet Kabrina Christiano R, NP   hydrOXYzine (ATARAX) 25 MG tablet Take 1 tablet (25 mg total) by mouth every 6 (six) hours. 12 tablet Rendy Lazard, Elita Boone, NP      PDMP not reviewed this encounter.   Valinda Hoar, NP 07/11/23 1258

## 2023-07-11 NOTE — ED Triage Notes (Addendum)
Patient to Urgent Care with complaints of multiple itchy lesions present to right side of face/ neck/ ear/ left shoulder/ right lower leg/ left hip.  Symptoms started mid-January. Believes they could be stress related d/t multiple stressful situations in her home life since November.  Meds: Benadryl/ 1% cortisone cream/ showers.   Denies any new product/ food usage.

## 2023-07-11 NOTE — Discharge Instructions (Signed)
Today you are being treated for the rash, appears inflammatory, no signs of infection  You have been given an injection of steroids today in the office today to help reduce the inflammatory process that occurs with this rash which will help minimize your itching as well as begin to clear  Starting tomorrow take prednisone every morning with food as directed, to continue the above process  May use Hydroxyzine every as needed to help with itching  You may continue use of topical calamine or Benadryl cream to help manage itching, you may also continue oral Benadryl  Please avoid long exposures to heat such as a hot steamy shower or being outside as this may cause further irritation to your rash  You may follow-up with his urgent care as needed if symptoms persist or worsen

## 2023-07-17 ENCOUNTER — Telehealth: Payer: Medicare HMO

## 2023-07-17 DIAGNOSIS — R21 Rash and other nonspecific skin eruption: Secondary | ICD-10-CM

## 2023-07-18 ENCOUNTER — Telehealth: Payer: Medicare HMO

## 2023-07-18 DIAGNOSIS — L509 Urticaria, unspecified: Secondary | ICD-10-CM

## 2023-07-18 MED ORDER — PREDNISONE 20 MG PO TABS: 40.0000 mg | ORAL_TABLET | Freq: Every day | ORAL | 0 refills | Status: DC

## 2023-07-18 NOTE — Progress Notes (Signed)
   Thank you for the details you included in the comment boxes. Those details are very helpful in determining the best course of treatment for you and help [place] to provide the best care.Because of recurrence of symptoms after an in-person evaluation and course of steroids, we recommend that you schedule a Virtual Urgent Care video visit in order for the provider to better assess what is going on.  The provider will be able to give you a more accurate diagnosis and treatment plan if we can more freely discuss your symptoms and with the addition of a virtual examination.   If you change your visit to a video visit, we will bill your insurance (similar to an office visit) and you will not be charged for this e-Visit. You will be able to stay at home and speak with the first available [facility] advanced practice provider. The link to do a video visit is in the drop down Menu tab of your Welcome screen in MyChart.

## 2023-07-18 NOTE — Progress Notes (Signed)
 Virtual Visit Consent   Amanda Liu, you are scheduled for a virtual visit with a [facility] provider today. Just as with appointments in the office, your consent must be obtained to participate. Your consent will be active for this visit and any virtual visit you may have with one of our providers in the next 365 days. If you have a MyChart account, a copy of this consent can be sent to you electronically.  As this is a virtual visit, video technology does not allow for your provider to perform a traditional examination. This may limit your provider's ability to fully assess your condition. If your provider identifies any concerns that need to be evaluated in person or the need to arrange testing (such as labs, EKG, etc.), we will make arrangements to do so. Although advances in technology are sophisticated, we cannot ensure that it will always work on either your end or our end. If the connection with a video visit is poor, the visit may have to be switched to a telephone visit. With either a video or telephone visit, we are not always able to ensure that we have a secure connection.  By engaging in this virtual visit, you consent to the provision of healthcare and authorize for your insurance to be billed (if applicable) for the services provided during this visit. Depending on your insurance coverage, you may receive a charge related to this service.  I need to obtain your verbal consent now. Are you willing to proceed with your visit today? Amanda Liu has provided verbal consent on 07/18/2023 for a virtual visit (video or telephone). Margaretann Loveless, PA-C  Date: 07/18/2023 9:29 AM   Virtual Visit via Video Note   I, Margaretann Loveless, connected with  Amanda Liu  ([id number], Dec 05, 1955) on 07/18/23 at  68:45 AM EST by a video-enabled telemedicine application and verified that I am speaking with the correct person using two identifiers.  Location: Patient: Virtual Visit  Location Patient: Home Provider: Virtual Visit Location Provider: Home Office   I discussed the limitations of evaluation and management by telemedicine and the availability of in person appointments. The patient expressed understanding and agreed to proceed.    History of Present Illness: Amanda Liu is a 68 y.o. who identifies as a female who was assigned female at birth, and is being seen today for rash.  HPI: Rash This is a new problem. The current episode started 1 to 4 weeks ago (Has been ongoing for 4 weeks; reports increased stressors since Nov 2024; Yesterday had last Prednisone and rash started back). The problem has been gradually worsening (had been present for 18 days and went to UC on 07/11/23 and given IM Medrol, then 6 day taper of prednisone, and hydroxyzine) since onset. The affected locations include the chest and torso (little finger, elbow, neck). The rash is characterized by redness, itchiness and burning. Pertinent negatives include no congestion, fatigue, fever or shortness of breath. Treatments tried: Prednisone, hydroxyzine, benadryl, topical hydrocortisone cream. The treatment provided no relief.  Presents in welps    Problems:  Patient Active Problem List   Diagnosis Date Noted   Hyperlipidemia 06/08/2022   Low back pain 06/08/2022   Bilateral knee pain 10/18/2021   Insomnia 03/03/2018   Bilateral carpal tunnel syndrome 10/31/2017   Tremor 02/17/2016   Allergic rhinitis 02/17/2016   Essential hypertension 11/17/2015   History of left breast cancer 11/17/2015   Stress incontinence 11/17/2015   Osteopenia 10/28/2014  Allergies: No Known Allergies Medications:  Current Outpatient Medications:    amLODipine (NORVASC) 5 MG tablet, TAKE 1 TABLET EVERY DAY, Disp: 90 tablet, Rfl: 3   Calcium 200 MG TABS, Take by mouth., Disp: , Rfl:    Glucosamine 500 MG CAPS, Take by mouth., Disp: , Rfl:    hydrochlorothiazide (HYDRODIURIL) 25 MG tablet, TAKE 1 TABLET  EVERY DAY, Disp: 90 tablet, Rfl: 3   hydrOXYzine (ATARAX) 25 MG tablet, Take 1 tablet (25 mg total) by mouth every 6 (six) hours., Disp: 12 tablet, Rfl: 0   lisinopril (ZESTRIL) 10 MG tablet, TAKE 1 TABLET EVERY DAY, Disp: 90 tablet, Rfl: 3   predniSONE (DELTASONE) 20 MG tablet, Take 2 tablets (40 mg total) by mouth daily with breakfast., Disp: 14 tablet, Rfl: 0   rosuvastatin (CRESTOR) 10 MG tablet, Take 1 tablet (10 mg total) by mouth daily., Disp: 90 tablet, Rfl: 3   traZODone (DESYREL) 50 MG tablet, Take 0.5 tablets (25 mg total) by mouth at bedtime as needed for sleep., Disp: 30 tablet, Rfl: 3  Observations/Objective: Patient is well-developed, well-nourished in no acute distress.  Resting comfortably at home.  Head is normocephalic, atraumatic.  No labored breathing.  Speech is clear and coherent with logical content.  Patient is alert and oriented at baseline.    Assessment and Plan: 1. Urticaria (Primary) - predniSONE (DELTASONE) 20 MG tablet; Take 2 tablets (40 mg total) by mouth daily with breakfast.  Dispense: 14 tablet; Refill: 0  - Symptoms seem consistent with stress-induced urticaria - Have provided a few more days of prednisone to be able to get through the weekend without a rash (it is her daughter's wedding) - Discussed longer term may require 24 hr non-drowsy antihistamine, famotidine, and benadryl possibly at night - Discussed course of stress-induced urticaria and that sometimes can last up to 6 months - Continue cool to luke-warm showers to avoid itch reflex - Limit scratching as this can make itching worse as well, and increase risk for secondary skin infections - Moisturize skin well - Seek further evaluation in person if worsening  Follow Up Instructions: I discussed the assessment and treatment plan with the patient. The patient was provided an opportunity to ask questions and all were answered. The patient agreed with the plan and demonstrated an understanding  of the instructions.  A copy of instructions were sent to the patient via MyChart unless otherwise noted below.    The patient was advised to call back or seek an in-person evaluation if the symptoms worsen or if the condition fails to improve as anticipated.    Margaretann Loveless, PA-C

## 2023-07-18 NOTE — Patient Instructions (Signed)
 Adrian Prows, thank you for joining Margaretann Loveless, PA-C for today's virtual visit.  While this provider is not your primary care provider (PCP), if your PCP is located in our provider database this encounter information will be shared with them immediately following your visit.   A [facility] MyChart account gives you access to today's visit and all your visits, tests, and labs performed at [facility] " click here if you don't have a [facility] MyChart account or go to [email or web address]  Consent: (Patient) Amanda Liu provided verbal consent for this virtual visit at the beginning of the encounter.  Current Medications:  Current Outpatient Medications:    amLODipine (NORVASC) 5 MG tablet, TAKE 1 TABLET EVERY DAY, Disp: 90 tablet, Rfl: 3   Calcium 200 MG TABS, Take by mouth., Disp: , Rfl:    Glucosamine 500 MG CAPS, Take by mouth., Disp: , Rfl:    hydrochlorothiazide (HYDRODIURIL) 25 MG tablet, TAKE 1 TABLET EVERY DAY, Disp: 90 tablet, Rfl: 3   hydrOXYzine (ATARAX) 25 MG tablet, Take 1 tablet (25 mg total) by mouth every 6 (six) hours., Disp: 12 tablet, Rfl: 0   lisinopril (ZESTRIL) 10 MG tablet, TAKE 1 TABLET EVERY DAY, Disp: 90 tablet, Rfl: 3   predniSONE (DELTASONE) 20 MG tablet, Take 2 tablets (40 mg total) by mouth daily with breakfast., Disp: 14 tablet, Rfl: 0   rosuvastatin (CRESTOR) 10 MG tablet, Take 1 tablet (10 mg total) by mouth daily., Disp: 90 tablet, Rfl: 3   traZODone (DESYREL) 50 MG tablet, Take 0.5 tablets (25 mg total) by mouth at bedtime as needed for sleep., Disp: 30 tablet, Rfl: 3   Medications ordered in this encounter:  Meds ordered this encounter  Medications   DISCONTD: predniSONE (DELTASONE) 20 MG tablet    Sig: Take 2 tablets (40 mg total) by mouth daily with breakfast.    Dispense:  10 tablet    Refill:  0    Supervising Provider:   Merrilee Jansky [[id number]]   predniSONE (DELTASONE) 20 MG tablet    Sig: Take 2 tablets  (40 mg total) by mouth daily with breakfast.    Dispense:  14 tablet    Refill:  0    Please note dose change. Please fill the 7 day    Supervising Provider:   Merrilee Jansky [[id number]]     *If you need refills on other medications prior to your next appointment, please contact your pharmacy*  Follow-Up: Call back or seek an in-person evaluation if the symptoms worsen or if the condition fails to improve as anticipated.  [facility] [phone]  Other Instructions  Can use 24 hr non-drowsy antihistamine (I.E. Claritin/Loratadine, Zyrtec/Cetirizine, Allegra/Fexofenadine  Add Famotidine 20mg  Take 1 tablet once or twice daily if needed  Benadryl can be used as needed at bedtime for itching  Hives Hives (urticaria) are itchy, red, swollen areas of skin. They can show up on any part of the body. They often fade within 24 hours (acute hives). If you get new hives after the old ones fade and the cycle goes on for many days or weeks, it is called chronic hives. Hives do not spread from person to person (are not contagious). Hives can happen when your body reacts to something you are allergic to (allergen) or to something that irritates your skin. When you are exposed to something that triggers hives, your body releases a chemical called histamine. This causes redness, itching, and swelling.  Hives can show up right after you are exposed to a trigger or hours later. What are the causes? Hives may be caused by: Food allergies. Insect bites or stings. Allergies to pollen or pets. Spending time in sunlight, heat, or cold (exposure). Exercise. Stress. You can also get hives from other conditions and treatments. These include: Viruses, such as the common cold. Bacterial infections, such as urinary tract infections and strep throat. Certain medicines. Contact with latex or chemicals. Allergy shots. Blood transfusions. In some cases, the cause of hives is not known  (idiopathic hives). What increases the risk? You are more likely to get hives if: You are female. You have food allergies. Hives are more common if you are allergic to citrus fruits, milk, eggs, peanuts, tree nuts, or shellfish. You are allergic to: Medicines. Latex. Insects. Animals. Pollen. What are the signs or symptoms? Common symptoms of hives include raised, itchy, red or white bumps or patches on your skin. These areas may: Become large and swollen (welts). Quickly change shape and location. This may happen more than once. Be separate hives or connect over a large area of skin. Sting or become painful. Turn white when pressed in the center (blanch). In severe cases, your hands, feet, and face may also become swollen. This may happen if hives form deeper in your skin. How is this diagnosed? Hives may be diagnosed based on your symptoms, medical history, and a physical exam. You may have skin, pee (urine), or blood tests done. These can help find out what is causing your hives and rule out other health issues. You may also have a biopsy done. This is when a small piece of skin is removed for testing. How is this treated? Treatment for hives depends on the cause and on how severe your symptoms are. You may be told to use cool, wet cloths (cool compresses) or to take cool showers to relieve itching. Treatment may also include: Medicines to help: Relieve itching (antihistamines). Reduce swelling (corticosteroids). Treat infection (antibiotics). An injectable medicine called omalizumab. You may need this if you have chronic idiopathic hives and still have symptoms even after you are treated with antihistamines. In severe cases, you may need to use a device filled with medicine that gives an emergency shot of epinephrine (auto-injector pen) to prevent a very bad allergic reaction (anaphylactic reaction). Follow these instructions at home: Medicines Take and apply over-the-counter and  prescription medicines only as told by your health care provider. If you were prescribed antibiotics, take them as told by your provider. Do not stop using the antibiotic even if you start to feel better. Skin care Apply cool compresses to the affected areas. Do not scratch or rub your skin. General instructions Do not take hot showers or baths. This can make itching worse. Do not wear tight-fitting clothing. Use sunscreen. Wear protective clothing when you are outside. Avoid anything that causes your hives. Keep a journal to help track what causes your hives. Write down: What medicines you take. What you eat and drink. What products you use on your skin. Keep all follow-up visits. Your provider will track how well treatment is working. Contact a health care provider if: Your symptoms do not get better with medicine. Your joints are painful or swollen. You have a fever. You have pain in your abdomen. Get help right away if: Your tongue, lips, or eyelids swell. Your chest or throat feels tight. You have trouble breathing or swallowing. These symptoms may be an emergency. Use  the auto-injector pen right away. Then call 911. Do not wait to see if the symptoms will go away. Do not drive yourself to the hospital. This information is not intended to replace advice given to you by your health care provider. Make sure you discuss any questions you have with your health care provider. Document Revised: 02/10/2022 Document Reviewed: 02/01/2022 Elsevier Patient Education  2024 Elsevier Inc.   If you have been instructed to have an in-person evaluation today at a local Urgent Care facility, please use the link below. It will take you to a list of all of our available [facility], including address, phone number and hours of operation. Please do not delay care.  [facility]  If you or a family member do not have a primary care provider, use the link below to schedule a  visit and establish care. When you choose a [facility] primary care physician or advanced practice provider, you gain a long-term partner in health. Find a Primary Care Provider  Learn more about [facility] in-office and virtual care options: [facility] - Get Care Now

## 2023-08-11 ENCOUNTER — Ambulatory Visit: Payer: Medicare HMO | Admitting: Physician Assistant

## 2023-08-15 ENCOUNTER — Ambulatory Visit (INDEPENDENT_AMBULATORY_CARE_PROVIDER_SITE_OTHER): Admitting: Physician Assistant

## 2023-08-15 ENCOUNTER — Encounter: Payer: Self-pay | Admitting: Physician Assistant

## 2023-08-15 ENCOUNTER — Ambulatory Visit: Admitting: Physician Assistant

## 2023-08-15 VITALS — BP 122/88 | HR 100 | Temp 98.2°F | Ht 65.0 in | Wt 163.0 lb

## 2023-08-15 DIAGNOSIS — R21 Rash and other nonspecific skin eruption: Secondary | ICD-10-CM | POA: Insufficient documentation

## 2023-08-15 DIAGNOSIS — I10 Essential (primary) hypertension: Secondary | ICD-10-CM

## 2023-08-15 DIAGNOSIS — G4700 Insomnia, unspecified: Secondary | ICD-10-CM

## 2023-08-15 MED ORDER — HYDROXYZINE HCL 25 MG PO TABS
25.0000 mg | ORAL_TABLET | Freq: Every evening | ORAL | 0 refills | Status: AC
Start: 2023-08-15 — End: ?

## 2023-08-15 MED ORDER — AMLODIPINE BESYLATE 5 MG PO TABS
5.0000 mg | ORAL_TABLET | Freq: Every day | ORAL | 3 refills | Status: AC
Start: 2023-08-15 — End: ?

## 2023-08-15 MED ORDER — TRAZODONE HCL 50 MG PO TABS: 25.0000 mg | ORAL_TABLET | Freq: Every evening | ORAL | 1 refills | Status: DC | PRN

## 2023-08-15 MED ORDER — HYDROCHLOROTHIAZIDE 25 MG PO TABS
25.0000 mg | ORAL_TABLET | Freq: Every day | ORAL | 3 refills | Status: AC
Start: 2023-08-15 — End: ?

## 2023-08-15 MED ORDER — LISINOPRIL 10 MG PO TABS: 10.0000 mg | ORAL_TABLET | Freq: Every day | ORAL | 3 refills | Status: AC

## 2023-08-15 NOTE — Progress Notes (Signed)
 Date:  08/15/2023   Name:  Amanda Liu   DOB:  June 13, 1955   MRN:  [id number]   Chief Complaint: Urticaria (X2 months, Went to UC, had a shot and prednisone, got better when medication was completed it came back, was on prednisone for 5 more days went away and came back, comes and goes, itching, redness)  HPI Amanda Liu presents today for 83-month follow-up on chronic conditions which are stable, needs medication refills but would like to focus on intermittent pruritic rash of the whole body for the last 2 months.  Of note, there has been a lot of stress in her life recently with the loss of multiple family members and her daughter's wedding.  She has been evaluated by urgent care twice with suspicion for stress-induced urticaria, temporarily improved with 2 courses of prednisone, but always returns.  Currently using loratadine and pepcid at home.  She feels that her stress levels are coming down.  Apart from this, she still has erythematous lesion on the anterior chest wall, has dermatology visit scheduled for 08/30/23 to evaluate this.  Medication list has been reviewed and updated.  Current Meds  Medication Sig   Calcium 200 MG TABS Take by mouth.   Glucosamine 500 MG CAPS Take by mouth.   rosuvastatin (CRESTOR) 10 MG tablet Take 1 tablet (10 mg total) by mouth daily.   [DISCONTINUED] amLODipine (NORVASC) 5 MG tablet TAKE 1 TABLET EVERY DAY   [DISCONTINUED] hydrochlorothiazide (HYDRODIURIL) 25 MG tablet TAKE 1 TABLET EVERY DAY   [DISCONTINUED] lisinopril (ZESTRIL) 10 MG tablet TAKE 1 TABLET EVERY DAY   [DISCONTINUED] predniSONE (DELTASONE) 20 MG tablet Take 2 tablets (40 mg total) by mouth daily with breakfast.   [DISCONTINUED] traZODone (DESYREL) 50 MG tablet Take 0.5 tablets (25 mg total) by mouth at bedtime as needed for sleep.     Review of Systems  Patient Active Problem List   Diagnosis Date Noted   Rash 08/15/2023   Hyperlipidemia 06/08/2022   Low back pain 06/08/2022    Bilateral knee pain 10/18/2021   Insomnia 03/03/2018   Bilateral carpal tunnel syndrome 10/31/2017   Tremor 02/17/2016   Allergic rhinitis 02/17/2016   Essential hypertension 11/17/2015   History of left breast cancer 11/17/2015   Stress incontinence 11/17/2015   Osteopenia 10/28/2014    No Known Allergies  Immunization History  Administered Date(s) Administered   Influenza,inj,Quad PF,6+ Mos 02/25/2015, 03/18/2016, 05/01/2017, 02/20/2018    Past Surgical History:  Procedure Laterality Date   ABDOMINAL HYSTERECTOMY     BREAST BIOPSY Left 07/2011   left breast invasive mammary carcinoma   BREAST LUMPECTOMY Left 03/16/2012   invasive mammary carcinoma: invasive ductal carcinoma. clear margins after reexcision. 5 LN positive for macrometastasis   TONSILLECTOMY      Social History   Tobacco Use   Smoking status: Former    Types: Cigarettes   Smokeless tobacco: Never   Tobacco comments:    Smoked for 10 years- smoked 0.25  Vaping Use   Vaping status: Never Used  Substance Use Topics   Alcohol use: No    Alcohol/week: 0.0 standard drinks of alcohol   Drug use: No    Family History  Problem Relation Age of Onset   Alcohol abuse Father    Breast cancer Sister 53   Post-traumatic stress disorder Daughter        mental/emotional abuse   Eating disorder Daughter    Stroke Maternal Grandmother    Breast cancer Maternal Grandmother  80   Alcoholism Other        Parent        08/15/2023   10:27 AM 06/13/2023    2:42 PM 02/27/2023    8:54 AM 06/08/2022    8:54 AM  GAD 7 : Generalized Anxiety Score  Nervous, Anxious, on Edge 1 3 0 0  Control/stop worrying 0 3 0 0  Worry too much - different things 0 0 0 0  Trouble relaxing 0 3 0 0  Restless 0 3 0 0  Easily annoyed or irritable 0 0 0 0  Afraid - awful might happen 0 0 0 0  Total GAD 7 Score 1 12 0 0  Anxiety Difficulty Not difficult at all Somewhat difficult Not difficult at all Not difficult at all        08/15/2023   10:27 AM 06/13/2023    2:42 PM 02/27/2023    8:54 AM  Depression screen PHQ 2/9  Decreased Interest 0 0 0  Down, Depressed, Hopeless 0 0 0  PHQ - 2 Score 0 0 0  Altered sleeping   2  Tired, decreased energy   2  Change in appetite   2  Feeling bad or failure about yourself    0  Trouble concentrating   0  Moving slowly or fidgety/restless   0  Suicidal thoughts   0  PHQ-9 Score   6  Difficult doing work/chores   Not difficult at all    BP Readings from Last 3 Encounters:  08/15/23 122/88  07/11/23 121/86  06/13/23 122/76    Wt Readings from Last 3 Encounters:  08/15/23 163 lb (73.9 kg)  06/13/23 165 lb (74.8 kg)  02/27/23 173 lb (78.5 kg)    BP 122/88   Pulse 100   Temp 98.2 F (36.8 C)   Ht 5\' 5"  (1.651 m)   Wt 163 lb (73.9 kg)   SpO2 97%   BMI 27.12 kg/m   Physical Exam Vitals and nursing note reviewed.  Constitutional:      Appearance: Normal appearance.  Cardiovascular:     Rate and Rhythm: Normal rate and regular rhythm.     Heart sounds: No murmur heard.    No friction rub. No gallop.  Pulmonary:     Effort: Pulmonary effort is normal.     Breath sounds: Normal breath sounds.  Abdominal:     General: There is no distension.  Musculoskeletal:        General: Normal range of motion.  Skin:    General: Skin is warm and dry.          Comments: Subtle red macules in various places on the arms, trunk, feet.  Bilateral erythema of knuckles.  Neurological:     Mental Status: She is alert and oriented to person, place, and time.     Gait: Gait is intact.  Psychiatric:        Mood and Affect: Mood and affect normal.     Recent Labs     Component Value Date/Time   NA 136 06/13/2023 1549   NA 134 (L) 08/27/2014 1127   K 3.9 06/13/2023 1549   K 3.3 (L) 08/27/2014 1127   CL 95 (L) 06/13/2023 1549   CL 101 08/27/2014 1127   CO2 28 06/13/2023 1549   CO2 25 08/27/2014 1127   GLUCOSE 95 06/13/2023 1549   GLUCOSE 105 (H) 06/08/2022 0909    GLUCOSE 140 (H) 08/27/2014 1127   BUN 9 06/13/2023  1549   BUN 13 08/27/2014 1127   CREATININE 0.58 06/13/2023 1549   CREATININE 0.58 08/27/2014 1127   CALCIUM 9.9 06/13/2023 1549   CALCIUM 9.4 08/27/2014 1127   PROT 7.1 06/13/2023 1549   PROT 7.5 08/27/2014 1127   ALBUMIN 4.8 06/13/2023 1549   ALBUMIN 4.7 08/27/2014 1127   AST 21 06/13/2023 1549   AST 24 08/27/2014 1127   ALT 16 06/13/2023 1549   ALT 19 08/27/2014 1127   ALKPHOS 63 06/13/2023 1549   ALKPHOS 53 08/27/2014 1127   BILITOT 0.3 06/13/2023 1549   BILITOT 0.6 08/27/2014 1127   GFRNONAA >60 06/01/2017 0848   GFRNONAA >60 08/27/2014 1127   GFRAA >60 06/01/2017 0848   GFRAA >60 08/27/2014 1127    Lab Results  Component Value Date   WBC 5.7 06/13/2023   HGB 12.3 06/13/2023   HCT 38.6 06/13/2023   MCV 92 06/13/2023   PLT 243 06/13/2023   Lab Results  Component Value Date   HGBA1C 6.1 (H) 06/13/2023   Lab Results  Component Value Date   CHOL 141 06/13/2023   HDL 74 06/13/2023   LDLCALC 48 06/13/2023   LDLDIRECT 46.0 01/21/2022   TRIG 104 06/13/2023   CHOLHDL 1.9 06/13/2023   Lab Results  Component Value Date   TSH 3.140 06/13/2023     Assessment and Plan:  Rash Assessment & Plan: Continue loratadine every morning, refill hydroxyzine nightly.  Continue Pepcid.  Follow-up with dermatology.  Orders: -     hydrOXYzine HCl; Take 1 tablet (25 mg total) by mouth at bedtime. For rash  Dispense: 30 tablet; Refill: 0  Essential hypertension Assessment & Plan: Well-controlled with current regimen.  Refill antihypertensives  Orders: -     amLODIPine Besylate; Take 1 tablet (5 mg total) by mouth daily.  Dispense: 90 tablet; Refill: 3 -     hydroCHLOROthiazide; Take 1 tablet (25 mg total) by mouth daily.  Dispense: 90 tablet; Refill: 3 -     Lisinopril; Take 1 tablet (10 mg total) by mouth daily.  Dispense: 90 tablet; Refill: 3  Insomnia, unspecified type Assessment & Plan: Refill trazodone as  below  Orders: -     traZODone HCl; Take 0.5 tablets (25 mg total) by mouth at bedtime as needed for sleep.  Dispense: 90 tablet; Refill: 1     Return in about 4 months (around 12/15/2023) for OV f/u chronic conditions.    Alvester Morin, PA-C, DMSc, Nutritionist [facility] [phone]

## 2023-08-15 NOTE — Assessment & Plan Note (Signed)
 Refill trazodone as below

## 2023-08-15 NOTE — Assessment & Plan Note (Signed)
 Well-controlled with current regimen.  Refill antihypertensives

## 2023-08-15 NOTE — Assessment & Plan Note (Signed)
 Continue loratadine every morning, refill hydroxyzine nightly.  Continue Pepcid.  Follow-up with dermatology.

## 2023-08-16 ENCOUNTER — Ambulatory Visit: Admitting: Physician Assistant

## 2023-08-30 DIAGNOSIS — D2262 Melanocytic nevi of left upper limb, including shoulder: Secondary | ICD-10-CM | POA: Diagnosis not present

## 2023-08-30 DIAGNOSIS — D2261 Melanocytic nevi of right upper limb, including shoulder: Secondary | ICD-10-CM | POA: Diagnosis not present

## 2023-08-30 DIAGNOSIS — L821 Other seborrheic keratosis: Secondary | ICD-10-CM | POA: Diagnosis not present

## 2023-08-30 DIAGNOSIS — C44519 Basal cell carcinoma of skin of other part of trunk: Secondary | ICD-10-CM | POA: Diagnosis not present

## 2023-08-30 DIAGNOSIS — D485 Neoplasm of uncertain behavior of skin: Secondary | ICD-10-CM | POA: Diagnosis not present

## 2023-08-30 DIAGNOSIS — L309 Dermatitis, unspecified: Secondary | ICD-10-CM | POA: Diagnosis not present

## 2023-08-30 DIAGNOSIS — D225 Melanocytic nevi of trunk: Secondary | ICD-10-CM | POA: Diagnosis not present

## 2023-09-27 DIAGNOSIS — C44511 Basal cell carcinoma of skin of breast: Secondary | ICD-10-CM | POA: Diagnosis not present

## 2023-09-27 DIAGNOSIS — C44519 Basal cell carcinoma of skin of other part of trunk: Secondary | ICD-10-CM | POA: Diagnosis not present

## 2023-10-25 DIAGNOSIS — H25813 Combined forms of age-related cataract, bilateral: Secondary | ICD-10-CM | POA: Diagnosis not present

## 2023-12-05 ENCOUNTER — Encounter: Payer: Self-pay | Admitting: Physician Assistant

## 2023-12-05 ENCOUNTER — Ambulatory Visit: Admitting: Physician Assistant

## 2024-01-08 ENCOUNTER — Other Ambulatory Visit: Payer: Self-pay | Admitting: Physician Assistant

## 2024-01-08 DIAGNOSIS — E785 Hyperlipidemia, unspecified: Secondary | ICD-10-CM

## 2024-01-11 NOTE — Telephone Encounter (Signed)
 Requested Prescriptions  Pending Prescriptions Disp Refills   rosuvastatin  (CRESTOR ) 10 MG tablet [Pharmacy Med Name: ROSUVASTATIN  CALCIUM  10 MG Oral Tablet] 90 tablet 1    Sig: TAKE 1 TABLET EVERY DAY     Cardiovascular:  Antilipid - Statins 2 Failed - 01/11/2024 12:30 PM      Failed - Lipid Panel in normal range within the last 12 months    Cholesterol, Total  Date Value Ref Range Status  06/13/2023 141 100 - 199 mg/dL Final   LDL Chol Calc (NIH)  Date Value Ref Range Status  06/13/2023 48 0 - 99 mg/dL Final   Direct LDL  Date Value Ref Range Status  01/21/2022 46.0 mg/dL Final    Comment:    Optimal:  <100 mg/dLNear or Above Optimal:  100-129 mg/dLBorderline High:  130-159 mg/dLHigh:  160-189 mg/dLVery High:  >190 mg/dL   HDL  Date Value Ref Range Status  06/13/2023 74 >39 mg/dL Final   Triglycerides  Date Value Ref Range Status  06/13/2023 104 0 - 149 mg/dL Final         Passed - Cr in normal range and within 360 days    Creatinine  Date Value Ref Range Status  08/27/2014 0.58 mg/dL Final    Comment:    9.55-8.99 NOTE: New Reference Range  08/05/14    Creatinine, Ser  Date Value Ref Range Status  06/13/2023 0.58 0.57 - 1.00 mg/dL Final         Passed - Patient is not pregnant      Passed - Valid encounter within last 12 months    Recent Outpatient Visits           4 months ago Rash   [facility] at [facility], [place], [place]

## 2024-01-26 NOTE — Progress Notes (Signed)
 [facility] Quality Team Note  Name: Amanda Liu Date of Birth: 16-Sep-1955 MRN: [id number] Date: 01/26/2024  [facility] Quality Team has reviewed this patient's chart, please see recommendations below:  [facility] Quality Other; (CHART REVIEWED FOR COLON SCREENING. PATIENT CANCELLED COLONOSCOPY IN 2023, COLOGUARD ORDERED IN 2024, NOT YET COMPLETED. COULD THIS BE ADDRESSED DURING UPCOMING 9/3 VISIT?)

## 2024-01-31 ENCOUNTER — Encounter: Payer: Self-pay | Admitting: Physician Assistant

## 2024-01-31 ENCOUNTER — Ambulatory Visit (INDEPENDENT_AMBULATORY_CARE_PROVIDER_SITE_OTHER): Admitting: Physician Assistant

## 2024-01-31 VITALS — BP 110/84 | HR 81 | Temp 98.7°F | Ht 65.0 in | Wt 161.0 lb

## 2024-01-31 DIAGNOSIS — J309 Allergic rhinitis, unspecified: Secondary | ICD-10-CM | POA: Diagnosis not present

## 2024-01-31 MED ORDER — MONTELUKAST SODIUM 10 MG PO TABS
10.0000 mg | ORAL_TABLET | Freq: Every day | ORAL | 0 refills | Status: DC
Start: 2024-01-31 — End: 2024-02-21

## 2024-01-31 NOTE — Progress Notes (Signed)
 Date:  01/31/2024   Name:  Amanda Liu   DOB:  01/18/1956   MRN:  [id number]   Chief Complaint: Cough  Cough This is a recurrent problem. Episode onset: X3 weeks. The problem has been gradually improving. Episode frequency: 4-5 PM daily. The cough is Productive of sputum (clear). Associated symptoms include nasal congestion, postnasal drip and rhinorrhea. Treatments tried: claritin, zyrtec, nasacort. The treatment provided mild relief.   Amanda Liu returns to clinic today to discuss a mild but persistent cough, possibly allergic, usually most bothersome in the late afternoon. Also notes a feeling that the ears are clogged. OTC antihistamine and Nasacort ineffective. States the cough does not seem to be a chest problem but rather a response to postnasal drip.   Unrelated, she has an upcoming DEXA 02/27/24. She also has a Cologuard expiring soon (this month); she is uncertain if she still has the box but will check.    Medication list has been reviewed and updated.  Current Meds  Medication Sig   amLODipine  (NORVASC ) 5 MG tablet Take 1 tablet (5 mg total) by mouth daily.   Calcium  200 MG TABS Take by mouth.   Cetirizine HCl (ZYRTEC PO) Take by mouth as needed.   hydrochlorothiazide  (HYDRODIURIL ) 25 MG tablet Take 1 tablet (25 mg total) by mouth daily.   hydrOXYzine  (ATARAX ) 25 MG tablet Take 1 tablet (25 mg total) by mouth at bedtime. For rash   lisinopril  (ZESTRIL ) 10 MG tablet Take 1 tablet (10 mg total) by mouth daily.   Loratadine (CLARITIN PO) Take by mouth as needed.   montelukast  (SINGULAIR ) 10 MG tablet Take 1 tablet (10 mg total) by mouth at bedtime.   rosuvastatin  (CRESTOR ) 10 MG tablet TAKE 1 TABLET EVERY DAY   traZODone  (DESYREL ) 50 MG tablet Take 0.5 tablets (25 mg total) by mouth at bedtime as needed for sleep.   [DISCONTINUED] Glucosamine 500 MG CAPS Take by mouth.     Review of Systems  HENT:  Positive for postnasal drip and rhinorrhea.   Respiratory:  Positive for  cough.     Patient Active Problem List   Diagnosis Date Noted   Rash 08/15/2023   Hyperlipidemia 06/08/2022   Low back pain 06/08/2022   Bilateral knee pain 10/18/2021   Insomnia 03/03/2018   Bilateral carpal tunnel syndrome 10/31/2017   Tremor 02/17/2016   Allergic rhinitis 02/17/2016   Essential hypertension 11/17/2015   History of left breast cancer 11/17/2015   Stress incontinence 11/17/2015   Osteopenia 10/28/2014    No Known Allergies  Immunization History  Administered Date(s) Administered   Influenza,inj,Quad PF,6+ Mos 02/25/2015, 03/18/2016, 05/01/2017, 02/20/2018    Past Surgical History:  Procedure Laterality Date   ABDOMINAL HYSTERECTOMY     BREAST BIOPSY Left 07/2011   left breast invasive mammary carcinoma   BREAST LUMPECTOMY Left 03/16/2012   invasive mammary carcinoma: invasive ductal carcinoma. clear margins after reexcision. 5 LN positive for macrometastasis   TONSILLECTOMY     TUBAL LIGATION      Social History   Tobacco Use   Smoking status: Former    Types: Cigarettes   Smokeless tobacco: Never   Tobacco comments:    Smoked for 10 years- smoked 0.25  Vaping Use   Vaping status: Never Used  Substance Use Topics   Alcohol use: No    Alcohol/week: 0.0 standard drinks of alcohol   Drug use: No    Family History  Problem Relation Age of Onset   Alcohol  abuse Father    Breast cancer Sister 17   Post-traumatic stress disorder Daughter        mental/emotional abuse   Eating disorder Daughter    Stroke Maternal Grandmother    Breast cancer Maternal Grandmother 30   Alcoholism Other        Parent        01/31/2024    9:32 AM 08/15/2023   10:27 AM 06/13/2023    2:42 PM 02/27/2023    8:54 AM  GAD 7 : Generalized Anxiety Score  Nervous, Anxious, on Edge 1 1 3  0  Control/stop worrying 1 0 3 0  Worry too much - different things 1 0 0 0  Trouble relaxing 0 0 3 0  Restless 0 0 3 0  Easily annoyed or irritable 0 0 0 0  Afraid - awful might  happen 0 0 0 0  Total GAD 7 Score 3 1 12  0  Anxiety Difficulty Not difficult at all Not difficult at all Somewhat difficult Not difficult at all       01/31/2024    9:32 AM 08/15/2023   10:27 AM 06/13/2023    2:42 PM  Depression screen PHQ 2/9  Decreased Interest 0 0 0  Down, Depressed, Hopeless 0 0 0  PHQ - 2 Score 0 0 0    BP Readings from Last 3 Encounters:  01/31/24 110/84  08/15/23 122/88  07/11/23 121/86    Wt Readings from Last 3 Encounters:  01/31/24 161 lb (73 kg)  08/15/23 163 lb (73.9 kg)  06/13/23 165 lb (74.8 kg)    BP 110/84   Pulse 81   Temp 98.7 F (37.1 C)   Ht 5' 5 (1.651 m)   Wt 161 lb (73 kg)   SpO2 98%   BMI 26.79 kg/m   Physical Exam Vitals and nursing note reviewed.  Constitutional:      Appearance: Normal appearance.  HENT:     Right Ear: No middle ear effusion. Tympanic membrane is not erythematous.     Left Ear:  No middle ear effusion. Tympanic membrane is not erythematous.  Cardiovascular:     Rate and Rhythm: Normal rate and regular rhythm.     Heart sounds: No murmur heard.    No friction rub. No gallop.  Pulmonary:     Effort: Pulmonary effort is normal.     Breath sounds: Normal breath sounds.  Abdominal:     General: There is no distension.  Musculoskeletal:        General: Normal range of motion.  Skin:    General: Skin is warm and dry.  Neurological:     Mental Status: She is alert and oriented to person, place, and time.     Gait: Gait is intact.  Psychiatric:        Mood and Affect: Mood and affect normal.     Recent Labs     Component Value Date/Time   NA 136 06/13/2023 1549   NA 134 (L) 08/27/2014 1127   K 3.9 06/13/2023 1549   K 3.3 (L) 08/27/2014 1127   CL 95 (L) 06/13/2023 1549   CL 101 08/27/2014 1127   CO2 28 06/13/2023 1549   CO2 25 08/27/2014 1127   GLUCOSE 95 06/13/2023 1549   GLUCOSE 105 (H) 06/08/2022 0909   GLUCOSE 140 (H) 08/27/2014 1127   BUN 9 06/13/2023 1549   BUN 13 08/27/2014 1127    CREATININE 0.58 06/13/2023 1549   CREATININE 0.58 08/27/2014  1127   CALCIUM  9.9 06/13/2023 1549   CALCIUM  9.4 08/27/2014 1127   PROT 7.1 06/13/2023 1549   PROT 7.5 08/27/2014 1127   ALBUMIN 4.8 06/13/2023 1549   ALBUMIN 4.7 08/27/2014 1127   AST 21 06/13/2023 1549   AST 24 08/27/2014 1127   ALT 16 06/13/2023 1549   ALT 19 08/27/2014 1127   ALKPHOS 63 06/13/2023 1549   ALKPHOS 53 08/27/2014 1127   BILITOT 0.3 06/13/2023 1549   BILITOT 0.6 08/27/2014 1127   GFRNONAA >60 06/01/2017 0848   GFRNONAA >60 08/27/2014 1127   GFRAA >60 06/01/2017 0848   GFRAA >60 08/27/2014 1127    Lab Results  Component Value Date   WBC 5.7 06/13/2023   HGB 12.3 06/13/2023   HCT 38.6 06/13/2023   MCV 92 06/13/2023   PLT 243 06/13/2023   Lab Results  Component Value Date   HGBA1C 6.1 (H) 06/13/2023   HGBA1C 6.3 06/08/2022   HGBA1C 6.0 10/18/2021   Lab Results  Component Value Date   CHOL 141 06/13/2023   HDL 74 06/13/2023   LDLCALC 48 06/13/2023   LDLDIRECT 46.0 01/21/2022   TRIG 104 06/13/2023   CHOLHDL 1.9 06/13/2023   Lab Results  Component Value Date   TSH 3.140 06/13/2023      Assessment and Plan:  1. Allergic rhinitis, unspecified seasonality, unspecified trigger (Primary) Try montelukast , short interval follow up within the month. Continue OTC antihistamine and Nasacort for now.   - montelukast  (SINGULAIR ) 10 MG tablet; Take 1 tablet (10 mg total) by mouth at bedtime.  Dispense: 30 tablet; Refill: 0     Return in about 3 weeks (around 02/21/2024) for OV f/u allergies.    Rolan Hoyle, PA-C, DMSc, Nutritionist [facility] Primary Care and Sports Medicine [facility] [phone]

## 2024-02-01 ENCOUNTER — Other Ambulatory Visit

## 2024-02-21 ENCOUNTER — Ambulatory Visit (INDEPENDENT_AMBULATORY_CARE_PROVIDER_SITE_OTHER): Admitting: Physician Assistant

## 2024-02-21 ENCOUNTER — Encounter: Payer: Self-pay | Admitting: Physician Assistant

## 2024-02-21 VITALS — BP 104/82 | HR 93 | Temp 98.3°F | Ht 65.0 in | Wt 160.0 lb

## 2024-02-21 DIAGNOSIS — R0982 Postnasal drip: Secondary | ICD-10-CM

## 2024-02-21 DIAGNOSIS — J309 Allergic rhinitis, unspecified: Secondary | ICD-10-CM | POA: Diagnosis not present

## 2024-02-21 NOTE — Progress Notes (Signed)
 Date:  02/21/2024   Name:  Amanda Liu   DOB:  September 06, 1955   MRN:  [id number]   Chief Complaint: Allergies (Added medication for allergies did not help- Singulair  )  HPI Amanda Liu returns for 3-week short interval follow-up on allergic rhinitis and postnasal drip after starting montelukast  last visit for a mild but persistent cough for months, worse in the late afternoon.  She was advised to continue her OTC antihistamine and Nasacort.  Unfortunately the montelukast  has not helped very much.  Unrelated, her Cologuard expires in 1 week   Medication list has been reviewed and updated.  Current Meds  Medication Sig   amLODipine  (NORVASC ) 5 MG tablet Take 1 tablet (5 mg total) by mouth daily.   Calcium  200 MG TABS Take by mouth.   Cetirizine HCl (ZYRTEC PO) Take by mouth as needed.   hydrochlorothiazide  (HYDRODIURIL ) 25 MG tablet Take 1 tablet (25 mg total) by mouth daily.   hydrOXYzine  (ATARAX ) 25 MG tablet Take 1 tablet (25 mg total) by mouth at bedtime. For rash   lisinopril  (ZESTRIL ) 10 MG tablet Take 1 tablet (10 mg total) by mouth daily.   Loratadine (CLARITIN PO) Take by mouth as needed.   rosuvastatin  (CRESTOR ) 10 MG tablet TAKE 1 TABLET EVERY DAY   traZODone  (DESYREL ) 50 MG tablet Take 0.5 tablets (25 mg total) by mouth at bedtime as needed for sleep.   [DISCONTINUED] montelukast  (SINGULAIR ) 10 MG tablet Take 1 tablet (10 mg total) by mouth at bedtime.     Review of Systems  Patient Active Problem List   Diagnosis Date Noted   Rash 08/15/2023   Hyperlipidemia 06/08/2022   Low back pain 06/08/2022   Bilateral knee pain 10/18/2021   Insomnia 03/03/2018   Bilateral carpal tunnel syndrome 10/31/2017   Tremor 02/17/2016   Allergic rhinitis with postnasal drip 02/17/2016   Essential hypertension 11/17/2015   History of left breast cancer 11/17/2015   Stress incontinence 11/17/2015   Osteopenia 10/28/2014    No Known Allergies  Immunization History  Administered  Date(s) Administered   Influenza,inj,Quad PF,6+ Mos 02/25/2015, 03/18/2016, 05/01/2017, 02/20/2018    Past Surgical History:  Procedure Laterality Date   ABDOMINAL HYSTERECTOMY     BREAST BIOPSY Left 07/2011   left breast invasive mammary carcinoma   BREAST LUMPECTOMY Left 03/16/2012   invasive mammary carcinoma: invasive ductal carcinoma. clear margins after reexcision. 5 LN positive for macrometastasis   TONSILLECTOMY     TUBAL LIGATION      Social History   Tobacco Use   Smoking status: Former    Types: Cigarettes   Smokeless tobacco: Never   Tobacco comments:    Smoked for 10 years- smoked 0.25  Vaping Use   Vaping status: Never Used  Substance Use Topics   Alcohol use: No    Alcohol/week: 0.0 standard drinks of alcohol   Drug use: No    Family History  Problem Relation Age of Onset   Alcohol abuse Father    Breast cancer Sister 52   Post-traumatic stress disorder Daughter        mental/emotional abuse   Eating disorder Daughter    Stroke Maternal Grandmother    Breast cancer Maternal Grandmother 23   Alcoholism Other        Parent        01/31/2024    9:32 AM 08/15/2023   10:27 AM 06/13/2023    2:42 PM 02/27/2023    8:54 AM  GAD 7 :  Generalized Anxiety Score  Nervous, Anxious, on Edge 1 1 3  0  Control/stop worrying 1 0 3 0  Worry too much - different things 1 0 0 0  Trouble relaxing 0 0 3 0  Restless 0 0 3 0  Easily annoyed or irritable 0 0 0 0  Afraid - awful might happen 0 0 0 0  Total GAD 7 Score 3 1 12  0  Anxiety Difficulty Not difficult at all Not difficult at all Somewhat difficult Not difficult at all       01/31/2024    9:32 AM 08/15/2023   10:27 AM 06/13/2023    2:42 PM  Depression screen PHQ 2/9  Decreased Interest 0 0 0  Down, Depressed, Hopeless 0 0 0  PHQ - 2 Score 0 0 0    BP Readings from Last 3 Encounters:  02/21/24 104/82  01/31/24 110/84  08/15/23 122/88    Wt Readings from Last 3 Encounters:  02/21/24 160 lb (72.6 kg)   01/31/24 161 lb (73 kg)  08/15/23 163 lb (73.9 kg)    BP 104/82   Pulse 93   Temp 98.3 F (36.8 C)   Ht 5' 5 (1.651 m)   Wt 160 lb (72.6 kg)   SpO2 98%   BMI 26.63 kg/m   Physical Exam Vitals and nursing note reviewed.  Constitutional:      Appearance: Normal appearance.  Cardiovascular:     Rate and Rhythm: Normal rate.  Pulmonary:     Effort: Pulmonary effort is normal.  Abdominal:     General: There is no distension.  Musculoskeletal:        General: Normal range of motion.  Skin:    General: Skin is warm and dry.  Neurological:     Mental Status: She is alert and oriented to person, place, and time.     Gait: Gait is intact.  Psychiatric:        Mood and Affect: Mood and affect normal.     Recent Labs     Component Value Date/Time   NA 136 06/13/2023 1549   NA 134 (L) 08/27/2014 1127   K 3.9 06/13/2023 1549   K 3.3 (L) 08/27/2014 1127   CL 95 (L) 06/13/2023 1549   CL 101 08/27/2014 1127   CO2 28 06/13/2023 1549   CO2 25 08/27/2014 1127   GLUCOSE 95 06/13/2023 1549   GLUCOSE 105 (H) 06/08/2022 0909   GLUCOSE 140 (H) 08/27/2014 1127   BUN 9 06/13/2023 1549   BUN 13 08/27/2014 1127   CREATININE 0.58 06/13/2023 1549   CREATININE 0.58 08/27/2014 1127   CALCIUM  9.9 06/13/2023 1549   CALCIUM  9.4 08/27/2014 1127   PROT 7.1 06/13/2023 1549   PROT 7.5 08/27/2014 1127   ALBUMIN 4.8 06/13/2023 1549   ALBUMIN 4.7 08/27/2014 1127   AST 21 06/13/2023 1549   AST 24 08/27/2014 1127   ALT 16 06/13/2023 1549   ALT 19 08/27/2014 1127   ALKPHOS 63 06/13/2023 1549   ALKPHOS 53 08/27/2014 1127   BILITOT 0.3 06/13/2023 1549   BILITOT 0.6 08/27/2014 1127   GFRNONAA >60 06/01/2017 0848   GFRNONAA >60 08/27/2014 1127   GFRAA >60 06/01/2017 0848   GFRAA >60 08/27/2014 1127    Lab Results  Component Value Date   WBC 5.7 06/13/2023   HGB 12.3 06/13/2023   HCT 38.6 06/13/2023   MCV 92 06/13/2023   PLT 243 06/13/2023   Lab Results  Component Value Date  HGBA1C 6.1 (H) 06/13/2023   HGBA1C 6.3 06/08/2022   HGBA1C 6.0 10/18/2021   Lab Results  Component Value Date   CHOL 141 06/13/2023   HDL 74 06/13/2023   LDLCALC 48 06/13/2023   LDLDIRECT 46.0 01/21/2022   TRIG 104 06/13/2023   CHOLHDL 1.9 06/13/2023   Lab Results  Component Value Date   TSH 3.140 06/13/2023      Assessment and Plan:  Allergic rhinitis with postnasal drip Assessment & Plan: Stop montelukast  due to limited benefit.  Begin guaifenesin 600 mg once daily at lunch.  Encouraged to drink plenty of water while using this medication.  Patient will obtain OTC.  Will refer to ENT for further assessment and management.  Orders: -     Ambulatory referral to ENT     Return if symptoms worsen or fail to improve.    Rolan Hoyle, PA-C, DMSc, Nutritionist [facility] Primary Care and Sports Medicine [facility] [phone]

## 2024-02-21 NOTE — Assessment & Plan Note (Addendum)
 Stop montelukast  due to limited benefit.  Begin guaifenesin 600 mg once daily at lunch.  Encouraged to drink plenty of water while using this medication.  Patient will obtain OTC.  Will refer to ENT for further assessment and management.

## 2024-02-27 ENCOUNTER — Other Ambulatory Visit

## 2024-02-28 ENCOUNTER — Other Ambulatory Visit: Payer: Self-pay | Admitting: Physician Assistant

## 2024-02-28 DIAGNOSIS — J309 Allergic rhinitis, unspecified: Secondary | ICD-10-CM

## 2024-02-29 NOTE — Telephone Encounter (Signed)
 Discontinued 02/21/24, course completed.  Requested Prescriptions  Pending Prescriptions Disp Refills   montelukast  (SINGULAIR ) 10 MG tablet [Pharmacy Med Name: MONTELUKAST  10MG  TABLETS] 30 tablet 0    Sig: TAKE 1 TABLET(10 MG) BY MOUTH AT BEDTIME     Pulmonology:  Leukotriene Inhibitors Passed - 02/29/2024  2:47 PM      Passed - Valid encounter within last 12 months    Recent Outpatient Visits           1 week ago Allergic rhinitis with postnasal drip   [place] [facility] at [facility], [place]   4 weeks ago Allergic rhinitis, unspecified seasonality, unspecified trigger   [place] [facility] at [facility], [place]   6 months ago Rash   [facility] Medicine at [facility], [place], [place]

## 2024-03-20 ENCOUNTER — Other Ambulatory Visit

## 2024-03-28 ENCOUNTER — Ambulatory Visit

## 2024-04-11 ENCOUNTER — Ambulatory Visit (INDEPENDENT_AMBULATORY_CARE_PROVIDER_SITE_OTHER): Admitting: Emergency Medicine

## 2024-04-11 VITALS — Ht 66.0 in | Wt 158.0 lb

## 2024-04-11 DIAGNOSIS — Z0000 Encounter for general adult medical examination without abnormal findings: Secondary | ICD-10-CM

## 2024-04-11 DIAGNOSIS — Z1231 Encounter for screening mammogram for malignant neoplasm of breast: Secondary | ICD-10-CM

## 2024-04-11 DIAGNOSIS — Z1211 Encounter for screening for malignant neoplasm of colon: Secondary | ICD-10-CM

## 2024-04-11 DIAGNOSIS — Z780 Asymptomatic menopausal state: Secondary | ICD-10-CM

## 2024-04-11 NOTE — Patient Instructions (Signed)
 Ms. Almario,  Thank you for taking the time for your Medicare Wellness Visit. I appreciate your continued commitment to your health goals. Please review the care plan we discussed, and feel free to reach out if I can assist you further.  Please note that Annual Wellness Visits do not include a physical exam. Some assessments may be limited, especially if the visit was conducted virtually. If needed, we may recommend an in-person follow-up with your provider.  Ongoing Care Seeing your primary care provider every 3 to 6 months helps us  monitor your health and provide consistent, personalized care.   Referrals If a referral was made during today's visit and you haven't received any updates within two weeks, please contact the referred provider directly to check on the status.  Recommended Screenings:  You may get the tetanus and shingles vaccines at your local pharmacy at your convenience.  An order has been placed for a Cologuard for you. They will mail you the kit with instructions on how to obtain the sample and send it back in to be tested. If you do not received your kit, please call our office and let us  know.    Please call to schedule your mammogram (due after 06/06/24) and bone density scan:  [facility] at [facility] Address: [address], [place], [place] Phone: [phone]  [facility] [address], [address] [place], [place] [address] Phone: [phone]     Health Maintenance  Topic Date Due   DTaP/Tdap/Td vaccine (1 - Tdap) Never done   Cologuard (Stool DNA test)  Never done   Pneumococcal Vaccine for age over 46 (1 of 1 - PCV) Never done   Zoster (Shingles) Vaccine (1 of 2) Never done   DEXA scan (bone density measurement)  11/08/2021   Medicare Annual Wellness Visit  03/10/2023   Breast Cancer Screening  06/06/2024   Flu Shot  08/27/2024*   Hepatitis C Screening  Completed   Meningitis B Vaccine  Aged  Out   COVID-19 Vaccine  Discontinued  *Topic was postponed. The date shown is not the original due date.       04/11/2024    8:56 AM  Advanced Directives  Does Patient Have a Medical Advance Directive? No  Would patient like information on creating a medical advance directive? No - Patient declined    Vision: Annual vision screenings are recommended for early detection of glaucoma, cataracts, and diabetic retinopathy. These exams can also reveal signs of chronic conditions such as diabetes and high blood pressure.  Dental: Annual dental screenings help detect early signs of oral cancer, gum disease, and other conditions linked to overall health, including heart disease and diabetes.  Please see the attached documents for additional preventive care recommendations.   Fall Prevention in the Home, Adult Falls can cause injuries and affect people of all ages. There are many simple things that you can do to make your home safe and to help prevent falls. If you need it, ask for help making these changes. What actions can I take to prevent falls? General information Use good lighting in all rooms. Make sure to: Replace any light bulbs that burn out. Turn on lights if it is dark and use night-lights. Keep items that you use often in easy-to-reach places. Lower the shelves around your home if needed. Move furniture so that there are clear paths around it. Do not keep throw rugs or other things on the floor that  can make you trip. If any of your floors are uneven, fix them. Add color or contrast paint or tape to clearly mark and help you see: Grab bars or handrails. First and last steps of staircases. Where the edge of each step is. If you use a ladder or stepladder: Make sure that it is fully opened. Do not climb a closed ladder. Make sure the sides of the ladder are locked in place. Have someone hold the ladder while you use it. Know where your pets are as you move through your  home. What can I do in the bathroom?     Keep the floor dry. Clean up any water that is on the floor right away. Remove soap buildup in the bathtub or shower. Buildup makes bathtubs and showers slippery. Use non-skid mats or decals on the floor of the bathtub or shower. Attach bath mats securely with double-sided, non-slip rug tape. If you need to sit down while you are in the shower, use a non-slip stool. Install grab bars by the toilet and in the bathtub and shower. Do not use towel bars as grab bars. What can I do in the bedroom? Make sure that you have a light by your bed that is easy to reach. Do not use any sheets or blankets on your bed that hang to the floor. Have a firm bench or chair with side arms that you can use for support when you get dressed. What can I do in the kitchen? Clean up any spills right away. If you need to reach something above you, use a sturdy step stool that has a grab bar. Keep electrical cables out of the way. Do not use floor polish or wax that makes floors slippery. What can I do with my stairs? Do not leave anything on the stairs. Make sure that you have a light switch at the top and the bottom of the stairs. Have them installed if you do not have them. Make sure that there are handrails on both sides of the stairs. Fix handrails that are broken or loose. Make sure that handrails are as long as the staircases. Install non-slip stair treads on all stairs in your home if they do not have carpet. Avoid having throw rugs at the top or bottom of stairs, or secure the rugs with carpet tape to prevent them from moving. Choose a carpet design that does not hide the edge of steps on the stairs. Make sure that carpet is firmly attached to the stairs. Fix any carpet that is loose or worn. What can I do on the outside of my home? Use bright outdoor lighting. Repair the edges of walkways and driveways and fix any cracks. Clear paths of anything that can make you  trip, such as tools or rocks. Add color or contrast paint or tape to clearly mark and help you see high doorway thresholds. Trim any bushes or trees on the main path into your home. Check that handrails are securely fastened and in good repair. Both sides of all steps should have handrails. Install guardrails along the edges of any raised decks or porches. Have leaves, snow, and ice cleared regularly. Use sand, salt, or ice melt on walkways during winter months if you live where there is ice and snow. In the garage, clean up any spills right away, including grease or oil spills. What other actions can I take? Review your medicines with your health care provider. Some medicines can make you confused or  feel dizzy. This can increase your chance of falling. Wear closed-toe shoes that fit well and support your feet. Wear shoes that have rubber soles and low heels. Use a cane, walker, scooter, or crutches that help you move around if needed. Talk with your provider about other ways that you can decrease your risk of falls. This may include seeing a physical therapist to learn to do exercises to improve movement and strength. Where to find more information Centers for Disease Control and Prevention, [employer]: tonerpromos.no [employer] on Aging: baseringtones.pl National Institute on Aging: baseringtones.pl Contact a health care provider if: You are afraid of falling at home. You feel weak, drowsy, or dizzy at home. You fall at home. Get help right away if you: Lose consciousness or have trouble moving after a fall. Have a fall that causes a head injury. These symptoms may be an emergency. Get help right away. Call 911. Do not wait to see if the symptoms will go away. Do not drive yourself to the hospital. This information is not intended to replace advice given to you by your health care provider. Make sure you discuss any questions you have with your health care provider. Document Revised: 01/17/2022  Document Reviewed: 01/17/2022 Elsevier Patient Education  2024 [employer].

## 2024-04-11 NOTE — Progress Notes (Signed)
 Chief Complaint  Patient presents with   Medicare Wellness     Subjective:   Amanda Liu is a 68 y.o. female who presents for a Medicare Annual Wellness Visit.  Allergies (verified) Patient has no known allergies.   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 clear margins after reexcision. 5 LN positive for macrometastasis   TONSILLECTOMY     TUBAL LIGATION     Family History  Problem Relation Age of Onset   Alcohol abuse Father    Breast cancer Sister 60   Post-traumatic stress disorder Daughter        mental/emotional abuse   Eating disorder Daughter    Stroke Maternal Grandmother    Breast cancer Maternal Grandmother 89   Alcoholism Other        Parent   Social History   Occupational History   Occupation: retired  Tobacco Use   Smoking status: Former    Current packs/day: 0.00    Average packs/day: 0.3 packs/day for 10.0 years (2.5 ttl pk-yrs)    Types: Cigarettes    Start date: 50    Quit date: 2005    Years since quitting: 20.8   Smokeless tobacco: Never   Tobacco comments:    Smoked for 10 years- smoked 0.25  Vaping Use   Vaping status: Never Used  Substance and Sexual Activity   Alcohol use: No    Alcohol/week: 0.0 standard  drinks of alcohol   Drug use: No   Sexual activity: Yes   Tobacco Counseling Counseling given: Not Answered Tobacco comments: Smoked for 10 years- smoked 0.25  SDOH Screenings   Food Insecurity: No Food Insecurity (04/11/2024)  Housing: Low Risk  (04/11/2024)  Transportation Needs: No Transportation Needs (04/11/2024)  Utilities: Not At Risk (04/11/2024)  Depression (PHQ2-9): Low Risk  (04/11/2024)  Financial Resource Strain: Low Risk  (03/09/2022)  Physical Activity: Insufficiently Active (04/11/2024)  Social Connections: Moderately Integrated (04/11/2024)  Stress: No Stress Concern Present (04/11/2024)  Tobacco Use: Medium Risk (04/11/2024)  Health Literacy: Adequate Health Literacy (04/11/2024)   See flowsheets for full screening details  Depression Screen PHQ 2 & 9 Depression Scale- Over the past 2 weeks, how often have you been bothered by any of the following problems? Little interest or pleasure in doing things: 0 Feeling down, depressed, or hopeless (PHQ Adolescent also includes...irritable): 0 PHQ-2 Total Score: 0 Trouble falling or staying asleep, or sleeping too much: 0 Feeling tired or having little energy: 0 Poor appetite or overeating (PHQ Adolescent also includes...weight loss): 0 Feeling bad about yourself - or that you are a failure or have let yourself or your family down: 0 Trouble concentrating on things, such as reading the newspaper or  watching television (PHQ Adolescent also includes...like school work): 0 Moving or speaking so slowly that other people could have noticed. Or the opposite - being so fidgety or restless that you have been moving around a lot more than usual: 0 Thoughts that you would be better off dead, or of hurting yourself in some way: 0 PHQ-9 Total Score: 0  Depression Treatment Depression Interventions/Treatment : PHQ2-9 Score <4 Follow-up Not Indicated     Goals Addressed               This Visit's Progress     Increase  physical activity (pt-stated)         Visit info / Clinical Intake: Medicare Wellness Visit Type:: Subsequent Annual Wellness Visit Persons participating in visit:: patient Medicare Wellness Visit Mode:: Telephone If telephone:: video declined Because this visit was a virtual/telehealth visit:: pt reported vitals If Telephone or Video please confirm:: I connected with the patient using audio enabled telemedicine application and verified that I am speaking with the correct person using two identifiers; I discussed the limitations of evaluation and management by telemedicine; The patient expressed understanding and agreed to proceed Patient Location:: home Provider Location:: home Information given by:: patient Interpreter Needed?: No Pre-visit prep was completed: yes AWV questionnaire completed by patient prior to visit?: no Living arrangements:: lives with spouse/significant other Patient's Overall Health Status Rating: very good Typical amount of pain: some Does pain affect daily life?: no Are you currently prescribed opioids?: no  Dietary Habits and Nutritional Risks How many meals a day?: 3 Eats fruit and vegetables daily?: yes Most meals are obtained by: preparing own meals In the last 2 weeks, have you had any of the following?: none Diabetic:: no  Functional Status Activities of Daily Living (to include ambulation/medication): Independent Ambulation: Independent with device- listed below Home Assistive Devices/Equipment: Eyeglasses Medication Administration: Independent Home Management: Independent Manage your own finances?: yes Primary transportation is: driving Concerns about vision?: (!) yes (having cataract removal 06/13/24) Concerns about hearing?: no  Fall Screening Falls in the past year?: 0 Number of falls in past year: 0 Was there an injury with Fall?: 0 Fall Risk Category Calculator: 0 Patient Fall Risk Level: Low Fall Risk  Fall Risk Patient at Risk  for Falls Due to: No Fall Risks Fall risk Follow up: Falls evaluation completed  Home and Transportation Safety: All rugs have non-skid backing?: (!) no (1 rug) All stairs or steps have railings?: yes Grab bars in the bathtub or shower?: (!) no Have non-skid surface in bathtub or shower?: yes Good home lighting?: yes Regular seat belt use?: yes Hospital stays in the last year:: no  Cognitive Assessment Difficulty concentrating, remembering, or making decisions? : no Will 6CIT or Mini Cog be Completed: yes What year is it?: 0 points What month is it?: 0 points Give patient an address phrase to remember (5 components): [address] [place] About what time is it?: 0 points Count backwards from 20 to 1: 0 points Say the months of the year in reverse: 0 points Repeat the address phrase from earlier: 0 points 6 CIT Score: 0 points  Advance Directives (For Healthcare) Does Patient Have a Medical Advance Directive?: No Would patient like information on creating a medical advance directive?: No - Patient declined  Reviewed/Updated  Reviewed/Updated: Reviewed All (Medical, Surgical, Family, Medications, Allergies, Care Teams, Patient Goals)        Objective:    Today's Vitals   04/11/24 0850  Weight: 158 lb (  71.7 kg)  Height: 5' 6 (1.676 m)   Body mass index is 25.5 kg/m.  Current Medications (verified) Outpatient Encounter Medications as of 04/11/2024  Medication Sig   amLODipine  (NORVASC ) 5 MG tablet Take 1 tablet (5 mg total) by mouth daily.   Calcium  200 MG TABS Take by mouth.   Cetirizine HCl (ZYRTEC PO) Take by mouth as needed.   hydrochlorothiazide  (HYDRODIURIL ) 25 MG tablet Take 1 tablet (25 mg total) by mouth daily.   hydrOXYzine  (ATARAX ) 25 MG tablet Take 1 tablet (25 mg total) by mouth at bedtime. For rash   lisinopril  (ZESTRIL ) 10 MG tablet Take 1 tablet (10 mg total) by mouth daily.   Loratadine (CLARITIN PO) Take by mouth as needed.   rosuvastatin   (CRESTOR ) 10 MG tablet TAKE 1 TABLET EVERY DAY   traZODone  (DESYREL ) 50 MG tablet Take 0.5 tablets (25 mg total) by mouth at bedtime as needed for sleep.   No facility-administered encounter medications on file as of 04/11/2024.   Hearing/Vision screen Hearing Screening - Comments:: Denies hearing loss  Vision Screening - Comments:: UTD with Dr. Manus Edison   [place] Immunizations and Health Maintenance Health Maintenance  Topic Date Due   DTaP/Tdap/Td (1 - Tdap) Never done   Fecal DNA (Cologuard)  Never done   Pneumococcal Vaccine: 50+ Years (1 of 1 - PCV) Never done   Zoster Vaccines- Shingrix (1 of 2) Never done   DEXA SCAN  11/08/2021   Mammogram  06/06/2024   Influenza Vaccine  08/27/2024 (Originally 12/29/2023)   Medicare Annual Wellness (AWV)  04/11/2025   Hepatitis C Screening  Completed   Meningococcal B Vaccine  Aged Out   COVID-19 Vaccine  Discontinued        Assessment/Plan:  This is a routine wellness examination for Kandee.  Patient Care Team: Manya Toribio SQUIBB, PA as PCP - General (Physician Assistant) Edison Manus DASEN., MD (Ophthalmology) Borg, Asberry SQUIBB, PA-C (Dermatology)  I have personally reviewed and noted the following in the patient's chart:   Medical and social history Use of alcohol, tobacco or illicit drugs  Current medications and supplements including opioid prescriptions. Functional ability and status Nutritional status Physical activity Advanced directives List of other physicians Hospitalizations, surgeries, and ER visits in previous 12 months Vitals Screenings to include cognitive, depression, and falls Referrals and appointments  Orders Placed This Encounter  Procedures   MM 3D SCREENING MAMMOGRAM BILATERAL BREAST    Standing Status:   Future    Expected Date:   06/06/2024    Expiration Date:   04/11/2025    Scheduling Instructions:     Would like to have same visit as DEXA    Reason for Exam (SYMPTOM  OR DIAGNOSIS REQUIRED):    screening for breast cancer    Preferred imaging location?:   [facility]   DG Bone Density    Standing Status:   Future    Expected Date:   06/06/2024    Expiration Date:   04/11/2025    Scheduling Instructions:     Would like to schedule same visit as MMG    Reason for Exam (SYMPTOM  OR DIAGNOSIS REQUIRED):   postmenopausal estrogen deficiency    Preferred imaging location?:   [facility]   Cologuard   In addition, I have reviewed and discussed with patient certain preventive protocols, quality metrics, and best practice recommendations. A written personalized care plan for preventive services as well as general preventive health recommendations were provided to patient.  Vina Ned, CMA   04/11/2024   Return in 53 weeks (on 04/17/2025).  After Visit Summary: (MyChart) Due to this being a telephonic visit, the after visit summary with patients personalized plan was offered to patient via MyChart   Nurse Notes:  Placed orders for MMG (due ~06/06/24) and DEXA scan. Patient will have done at the same time. Placed order for a cologuard kit. Patient's last kit expired. Needs Tdap and Shingles vaccines (pharmacy) Declined flu and pneumonia vaccines

## 2024-05-31 ENCOUNTER — Ambulatory Visit
Admission: RE | Admit: 2024-05-31 | Discharge: 2024-05-31 | Disposition: A | Discharge status: Home / Self Care | Source: Ambulatory Visit

## 2024-05-31 VITALS — BP 106/74 | HR 109 | Temp 98.0°F | Resp 21

## 2024-05-31 DIAGNOSIS — B349 Viral infection, unspecified: Secondary | ICD-10-CM

## 2024-05-31 MED ORDER — BENZONATATE 100 MG PO CAPS: 100.0000 mg | ORAL_CAPSULE | Freq: Three times a day (TID) | ORAL | 0 refills | Status: DC

## 2024-05-31 MED ORDER — PROMETHAZINE-DM 6.25-15 MG/5ML PO SYRP: 5.0000 mL | ORAL_SOLUTION | Freq: Four times a day (QID) | ORAL | 0 refills | Status: DC | PRN

## 2024-05-31 MED ORDER — OSELTAMIVIR PHOSPHATE 75 MG PO CAPS: 75.0000 mg | ORAL_CAPSULE | Freq: Two times a day (BID) | ORAL | 0 refills | Status: DC

## 2024-05-31 NOTE — ED Triage Notes (Signed)
 Patient complains of fever, cough and bodyaches x 2 days. Patient has taken Alka-Seltzer cold and Flu with mild relief. Rates pain 6/10.

## 2024-05-31 NOTE — Discharge Instructions (Addendum)
 Your symptoms today are most likely being caused by a virus and should steadily improve in time it can take up to 7 to 10 days before you truly start to see a turnaround however things will get better  You have declined COVID testing and flu testing is currently unavailable  Begin use of Tamiflu twice daily for 5 days, if flu virus is present and will help to minimize symptoms  May use Tessalon  pill every 8 hours as needed for cough and may use cough syrup every 6 hours but please be mindful can make you drowsy, cough syrup will also help with any nausea that is present    You can take Tylenol and/or Ibuprofen as needed for fever reduction and pain relief.   For cough: honey 1/2 to 1 teaspoon (you can dilute the honey in water or another fluid).  You can also use guaifenesin and dextromethorphan for cough. You can use a humidifier for chest congestion and cough.  If you don't have a humidifier, you can sit in the bathroom with the hot shower running.      For sore throat: try warm salt water gargles, cepacol lozenges, throat spray, warm tea or water with lemon/honey, popsicles or ice, or OTC cold relief medicine for throat discomfort.   For congestion: take a daily anti-histamine like Zyrtec, Claritin, and a oral decongestant, such as pseudoephedrine.  You can also use Flonase 1-2 sprays in each nostril daily.   It is important to stay hydrated: drink plenty of fluids (water, gatorade/powerade/pedialyte, juices, or teas) to keep your throat moisturized and help further relieve irritation/discomfort.

## 2024-05-31 NOTE — ED Provider Notes (Addendum)
 " [facility]    CSN: [id number] Arrival date & time: 05/31/24  1023      History   Chief Complaint Chief Complaint  Patient presents with   Fever    Entered by patient   Cough   Generalized Body Aches    HPI Amanda Liu is a 69 y.o. female.   Patient presents for evaluation of a fever peaking at 101.2, body aches nasal congestion, f throat, productive cough with clear sputum, nausea without vomiting present for 2 days.  Has experienced intermittent pressure to the bilateral cheeks.  Has attempted use of Alka-Seltzer cold and flu which worsened nausea.  Decreased appetite.  No known sick contacts prior.  Denies respiratory history, former smoker.    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   Personal history of chemotherapy 08/2011   left breast ca   Personal history of radiation therapy 03/2012-05/2012   left breast ca   UTI (lower urinary tract infection)     Patient Active Problem List   Diagnosis Date Noted   Rash 08/15/2023   Hyperlipidemia 06/08/2022   Low back pain 06/08/2022   Bilateral knee pain 10/18/2021   Insomnia 03/03/2018   Bilateral carpal tunnel syndrome 10/31/2017   Tremor 02/17/2016   Allergic rhinitis with postnasal drip 02/17/2016   Essential hypertension 11/17/2015   History of left breast cancer 11/17/2015   Stress incontinence 11/17/2015   Osteopenia 10/28/2014    Past Surgical History:  Procedure Laterality Date   ABDOMINAL HYSTERECTOMY     BREAST BIOPSY Left 07/2011   left breast invasive mammary carcinoma   BREAST LUMPECTOMY Left 03/16/2012   invasive mammary carcinoma: invasive ductal carcinoma. clear margins after  reexcision. 5 LN positive for macrometastasis   TONSILLECTOMY     TUBAL LIGATION      OB History   No obstetric history on file.      Home Medications    Prior to Admission medications  Medication Sig Start Date End Date Taking? Authorizing Provider  amLODipine  (NORVASC ) 5 MG tablet Take 1 tablet (5 mg total) by mouth daily. 08/15/23   Manya Toribio SQUIBB, PA  Calcium  200 MG TABS Take by mouth.    [provider]  Cetirizine HCl (ZYRTEC PO) Take by mouth as needed.    [provider]  hydrochlorothiazide  (HYDRODIURIL ) 25 MG tablet Take 1 tablet (25 mg total) by mouth daily. 08/15/23   Manya Toribio SQUIBB, PA  hydrOXYzine  (ATARAX ) 25 MG tablet Take 1 tablet (25 mg total) by mouth at bedtime. For rash 08/15/23   Manya Toribio SQUIBB, PA  lisinopril  (ZESTRIL ) 10 MG tablet Take 1 tablet (10 mg total) by mouth daily. 08/15/23   Manya Toribio SQUIBB, PA  Loratadine (CLARITIN PO) Take by mouth as needed.    [provider]  rosuvastatin  (CRESTOR ) 10 MG tablet TAKE 1 TABLET EVERY DAY 01/11/24   Manya Toribio SQUIBB, PA  traZODone  (DESYREL ) 50 MG tablet Take 0.5 tablets (25 mg total) by mouth at bedtime as needed for sleep. 08/15/23   Manya Toribio SQUIBB, PA    Family History Family History  Problem Relation Age of Onset  Alcohol abuse Father    Breast cancer Sister 58   Post-traumatic stress disorder Daughter        mental/emotional abuse   Eating disorder Daughter    Stroke Maternal Grandmother    Breast cancer Maternal Grandmother 73   Alcoholism Other        Parent    Social History Social History[1]   Allergies   Patient has no known allergies.   Review of Systems Review of Systems  Constitutional:  Positive for fever. Negative for activity change, appetite change, chills, diaphoresis, fatigue and unexpected weight change.  HENT:  Positive for congestion and sore throat. Negative for dental problem, drooling, ear discharge, ear pain, facial swelling, hearing  loss, mouth sores, nosebleeds, postnasal drip, rhinorrhea, sinus pressure, sinus pain, sneezing, tinnitus, trouble swallowing and voice change.   Respiratory:  Positive for cough. Negative for apnea, choking, chest tightness, shortness of breath, wheezing and stridor.   Gastrointestinal:  Positive for nausea. Negative for abdominal distention, abdominal pain, anal bleeding, blood in stool, constipation, diarrhea, rectal pain and vomiting.  Musculoskeletal:  Positive for myalgias. Negative for arthralgias, back pain, gait problem, joint swelling, neck pain and neck stiffness.     Physical Exam Triage Vital Signs ED Triage Vitals  Encounter Vitals Group     BP 05/31/24 1043 106/74     Girls Systolic BP Percentile --      Girls Diastolic BP Percentile --      Boys Systolic BP Percentile --      Boys Diastolic BP Percentile --      Pulse Rate 05/31/24 1044 (!) 109     Resp 05/31/24 1043 (!) 21     Temp 05/31/24 1043 98 F (36.7 C)     Temp Source 05/31/24 1043 Oral     SpO2 05/31/24 1044 94 %     Weight --      Height --      Head Circumference --      Peak Flow --      Pain Score 05/31/24 1046 6     Pain Loc --      Pain Education --      Exclude from Growth Chart --    No data found.  Updated Vital Signs BP 106/74 (BP Location: Right Arm)   Pulse (!) 109   Temp 98 F (36.7 C) (Oral)   Resp (!) 21   SpO2 94%   Visual Acuity Right Eye Distance:   Left Eye Distance:   Bilateral Distance:    Right Eye Near:   Left Eye Near:    Bilateral Near:     Physical Exam Constitutional:      Appearance: Normal appearance.  HENT:     Head: Normocephalic.     Right Ear: Tympanic membrane, ear canal and external ear normal.     Left Ear: Tympanic membrane, ear canal and external ear normal.     Nose: Congestion present.     Mouth/Throat:     Pharynx: No oropharyngeal exudate or posterior oropharyngeal erythema.  Cardiovascular:     Rate and Rhythm: Normal rate and regular  rhythm.     Pulses: Normal pulses.     Heart sounds: Normal heart sounds.  Pulmonary:     Effort: Pulmonary effort is normal.     Breath sounds: Normal breath sounds.  Musculoskeletal:     Cervical back: Normal range of motion.  Lymphadenopathy:     Cervical: No cervical adenopathy.  Neurological:  Mental Status: She is alert and oriented to person, place, and time. Mental status is at baseline.      UC Treatments / Results  Labs (all labs ordered are listed, but only abnormal results are displayed) Labs Reviewed - No data to display  EKG   Radiology No results found.  Procedures Procedures (including critical care time)  Medications Ordered in UC Medications - No data to display  Initial Impression / Assessment and Plan / UC Course  I have reviewed the triage vital signs and the nursing notes.  Pertinent labs & imaging results that were available during my care of the patient were reviewed by me and considered in my medical decision making (see chart for details).  Viral URI with cough  Patient is in no signs of distress nor toxic appearing.  Vital signs are stable.  Low suspicion for pneumonia, pneumothorax or bronchitis and therefore will defer imaging.  Declined COVID testing, concerned with influenza, testing unavailable, empirically placed on Tamiflu and additionally prescribed Tessalon  and Promethazine  DM for management of cough. May use additional over-the-counter medications as needed for supportive care.  May follow-up with urgent care as needed if symptoms persist or worsen.  Final Clinical Impressions(s) / UC Diagnoses   Final diagnoses:  None   Discharge Instructions   None    ED Prescriptions   None    PDMP not reviewed this encounter.    Teresa Shelba SAUNDERS, NP 05/31/24 1106     [1]  Social History Tobacco Use   Smoking status: Former    Current packs/day: 0.00    Average packs/day: 0.3 packs/day for 10.0 years (2.5 ttl pk-yrs)     Types: Cigarettes    Start date: 31    Quit date: 2005    Years since quitting: 21.0   Smokeless tobacco: Never   Tobacco comments:    Smoked for 10 years- smoked 0.25  Vaping Use   Vaping status: Never Used  Substance Use Topics   Alcohol use: No    Alcohol/week: 0.0 standard drinks of alcohol   Drug use: No     Teresa Shelba SAUNDERS, NP 05/31/24 1107  "

## 2024-06-03 ENCOUNTER — Ambulatory Visit: Admitting: Physician Assistant

## 2024-06-03 ENCOUNTER — Encounter: Payer: Self-pay | Admitting: Physician Assistant

## 2024-06-03 VITALS — BP 100/68 | HR 103 | Temp 98.4°F | Ht 66.0 in | Wt 153.0 lb

## 2024-06-03 DIAGNOSIS — R6889 Other general symptoms and signs: Secondary | ICD-10-CM | POA: Diagnosis not present

## 2024-06-03 LAB — POC COVID19/FLU A&B COMBO
Covid Antigen, POC: NEGATIVE
Influenza A Antigen, POC: NEGATIVE
Influenza B Antigen, POC: NEGATIVE

## 2024-06-03 LAB — POCT RAPID STREP A: Strep A Ag: NEGATIVE

## 2024-06-03 MED ORDER — AZITHROMYCIN 250 MG PO TABS: ORAL_TABLET | ORAL | 0 refills | Status: AC

## 2024-06-03 MED ORDER — GUAIFENESIN-CODEINE 100-10 MG/5ML PO SOLN: 5.0000 mL | Freq: Three times a day (TID) | ORAL | 0 refills | Status: AC | PRN

## 2024-06-03 NOTE — Progress Notes (Signed)
 "   Date:  06/03/2024   Name:  Amanda Liu   DOB:  Sep 19, 1955   MRN:  [id number]   Chief Complaint: Fever (100.0 - 101 fever yesturday ) and Cough (Went to UC on 05/31/24 given TAMIFLU , has 2 days left of medication, patient reports medication given is not helping, still fatigue and coughing, reports having a sore throat )  Fever  Associated symptoms include coughing.  Cough Associated symptoms include a fever.    Amanda Liu presents with 5 days URI symptoms as above, may have seen slight improvement in the last 24h, presently on Tamiflu  with two doses left. Has not had any viral testing yet. Feels very fatigued.   Medication list has been reviewed and updated.  Active Medications[1]   Review of Systems  Constitutional:  Positive for fever.  Respiratory:  Positive for cough.     Patient Active Problem List   Diagnosis Date Noted   Rash 08/15/2023   Hyperlipidemia 06/08/2022   Low back pain 06/08/2022   Bilateral knee pain 10/18/2021   Insomnia 03/03/2018   Bilateral carpal tunnel syndrome 10/31/2017   Tremor 02/17/2016   Allergic rhinitis with postnasal drip 02/17/2016   Essential hypertension 11/17/2015   History of left breast cancer 11/17/2015   Stress incontinence 11/17/2015   Osteopenia 10/28/2014    Allergies[2]  Immunization History  Administered Date(s) Administered   Influenza,inj,Quad PF,6+ Mos 02/25/2015, 03/18/2016, 05/01/2017, 02/20/2018    Past Surgical History:  Procedure Laterality Date   ABDOMINAL HYSTERECTOMY     BREAST BIOPSY Left 07/2011   left breast invasive mammary carcinoma   BREAST LUMPECTOMY Left 03/16/2012   invasive mammary carcinoma: invasive ductal carcinoma. clear margins after reexcision. 5 LN positive for macrometastasis   TONSILLECTOMY     TUBAL LIGATION      Social History[3]  Family History  Problem Relation Age of Onset   Alcohol abuse Father    Breast cancer Sister 8   Post-traumatic stress disorder Daughter         mental/emotional abuse   Eating disorder Daughter    Stroke Maternal Grandmother    Breast cancer Maternal Grandmother 40   Alcoholism Other        Parent        06/03/2024   11:10 AM 01/31/2024    9:32 AM 08/15/2023   10:27 AM 06/13/2023    2:42 PM  GAD 7 : Generalized Anxiety Score  Nervous, Anxious, on Edge 0 1 1 3   Control/stop worrying 0 1 0 3  Worry too much - different things  1 0 0  Trouble relaxing  0 0 3  Restless  0 0 3  Easily annoyed or irritable  0 0 0  Afraid - awful might happen  0 0 0  Total GAD 7 Score  3 1 12   Anxiety Difficulty  Not difficult at all Not difficult at all Somewhat difficult       06/03/2024   11:10 AM 04/11/2024    9:04 AM 01/31/2024    9:32 AM  Depression screen PHQ 2/9  Decreased Interest 0 0 0  Down, Depressed, Hopeless 0 0 0  PHQ - 2 Score 0 0 0  Altered sleeping  0   Tired, decreased energy  0   Change in appetite  0   Feeling bad or failure about yourself   0   Trouble concentrating  0   Moving slowly or fidgety/restless  0   Suicidal thoughts  0  PHQ-9 Score  0     BP Readings from Last 3 Encounters:  06/03/24 100/68  05/31/24 106/74  02/21/24 104/82    Wt Readings from Last 3 Encounters:  06/03/24 153 lb (69.4 kg)  04/11/24 158 lb (71.7 kg)  02/21/24 160 lb (72.6 kg)    BP 100/68   Pulse (!) 103   Temp 98.4 F (36.9 C) (Oral)   Ht 5' 6 (1.676 m)   Wt 153 lb (69.4 kg)   SpO2 94%   BMI 24.69 kg/m   Physical Exam Vitals and nursing note reviewed.  Constitutional:      General: She is not in acute distress.    Appearance: Normal appearance.  HENT:     Right Ear: Tympanic membrane normal.     Left Ear: Tympanic membrane normal.     Ears:     Comments: EAC clear bilaterally with good view of TM which is without effusion or erythema.     Nose:     Right Sinus: Maxillary sinus tenderness present.     Left Sinus: Maxillary sinus tenderness present.     Mouth/Throat:     Mouth: Mucous membranes are moist.      Pharynx: No oropharyngeal exudate or posterior oropharyngeal erythema.  Eyes:     Conjunctiva/sclera: Conjunctivae normal.     Pupils: Pupils are equal, round, and reactive to light.  Cardiovascular:     Rate and Rhythm: Regular rhythm. Tachycardia present.     Heart sounds: No murmur heard.    No friction rub. No gallop.  Pulmonary:     Effort: Pulmonary effort is normal.     Breath sounds: Examination of the right-upper field reveals wheezing. Examination of the left-upper field reveals wheezing. Wheezing present. No rhonchi or rales.  Lymphadenopathy:     Cervical: No cervical adenopathy.     Recent Labs     Component Value Date/Time   NA 136 06/13/2023 1549   NA 134 (L) 08/27/2014 1127   K 3.9 06/13/2023 1549   K 3.3 (L) 08/27/2014 1127   CL 95 (L) 06/13/2023 1549   CL 101 08/27/2014 1127   CO2 28 06/13/2023 1549   CO2 25 08/27/2014 1127   GLUCOSE 95 06/13/2023 1549   GLUCOSE 105 (H) 06/08/2022 0909   GLUCOSE 140 (H) 08/27/2014 1127   BUN 9 06/13/2023 1549   BUN 13 08/27/2014 1127   CREATININE 0.58 06/13/2023 1549   CREATININE 0.58 08/27/2014 1127   CALCIUM  9.9 06/13/2023 1549   CALCIUM  9.4 08/27/2014 1127   PROT 7.1 06/13/2023 1549   PROT 7.5 08/27/2014 1127   ALBUMIN 4.8 06/13/2023 1549   ALBUMIN 4.7 08/27/2014 1127   AST 21 06/13/2023 1549   AST 24 08/27/2014 1127   ALT 16 06/13/2023 1549   ALT 19 08/27/2014 1127   ALKPHOS 63 06/13/2023 1549   ALKPHOS 53 08/27/2014 1127   BILITOT 0.3 06/13/2023 1549   BILITOT 0.6 08/27/2014 1127   GFRNONAA >60 06/01/2017 0848   GFRNONAA >60 08/27/2014 1127   GFRAA >60 06/01/2017 0848   GFRAA >60 08/27/2014 1127    Lab Results  Component Value Date   WBC 5.7 06/13/2023   HGB 12.3 06/13/2023   HCT 38.6 06/13/2023   MCV 92 06/13/2023   PLT 243 06/13/2023   Lab Results  Component Value Date   HGBA1C 6.1 (H) 06/13/2023   HGBA1C 6.3 06/08/2022   HGBA1C 6.0 10/18/2021   Lab Results  Component Value Date   CHOL  141  06/13/2023   HDL 74 06/13/2023   LDLCALC 48 06/13/2023   LDLDIRECT 46.0 01/21/2022   TRIG 104 06/13/2023   CHOLHDL 1.9 06/13/2023   Lab Results  Component Value Date   TSH 3.140 06/13/2023      Assessment and Plan:  1. Flu-like symptoms (Primary) Suspect viral etiology.  Negative COVID, strep, and flu testing today.  Certainly possible that this is a false negative flu test especially with the use of Tamiflu .  Given her duration of symptoms, tachycardia on exam, and wheezing, will cover for pneumonia as well.  Also switching her cough syrup to guaifenesin  with codeine .  Discussed self-limited nature of viral illnesses and advised conservative measures including rest, fluids, honey, and OTC cough/cold medications. Contact precautions advised to limit spread. Encouraged mask wearing and good hand hygiene especially before meals.   - POC Covid19/Flu A&B Antigen - POCT Rapid Strep A - azithromycin  (ZITHROMAX ) 250 MG tablet; Take 2 tablets on day 1, then 1 tablet daily on days 2 through 5  Dispense: 6 tablet; Refill: 0 - guaiFENesin -codeine  100-10 MG/5ML syrup; Take 5 mLs by mouth 3 (three) times daily as needed.  Dispense: 120 mL; Refill: 0     No follow-ups on file.    Rolan Hoyle, PA-C, DMSc, DipACLM, Nutritionist [facility] [phone]      [1]  Current Meds  Medication Sig   amLODipine  (NORVASC ) 5 MG tablet Take 1 tablet (5 mg total) by mouth daily.   azithromycin  (ZITHROMAX ) 250 MG tablet Take 2 tablets on day 1, then 1 tablet daily on days 2 through 5   Calcium  200 MG TABS Take by mouth.   Cetirizine HCl (ZYRTEC PO) Take by mouth as needed.   guaiFENesin -codeine  100-10 MG/5ML syrup Take 5 mLs by mouth 3 (three) times daily as needed.   hydrochlorothiazide  (HYDRODIURIL ) 25 MG tablet Take 1 tablet (25 mg total) by mouth daily.   hydrOXYzine  (ATARAX ) 25 MG tablet Take 1 tablet (25 mg  total) by mouth at bedtime. For rash   lisinopril  (ZESTRIL ) 10 MG tablet Take 1 tablet (10 mg total) by mouth daily.   Loratadine (CLARITIN PO) Take by mouth as needed.   oseltamivir  (TAMIFLU ) 75 MG capsule Take 1 capsule (75 mg total) by mouth every 12 (twelve) hours.   rosuvastatin  (CRESTOR ) 10 MG tablet TAKE 1 TABLET EVERY DAY   traZODone  (DESYREL ) 50 MG tablet Take 0.5 tablets (25 mg total) by mouth at bedtime as needed for sleep.   [DISCONTINUED] benzonatate  (TESSALON ) 100 MG capsule Take 1 capsule (100 mg total) by mouth every 8 (eight) hours.  [2] No Known Allergies [3]  Social History Tobacco Use   Smoking status: Former    Current packs/day: 0.00    Average packs/day: 0.3 packs/day for 10.0 years (2.5 ttl pk-yrs)    Types: Cigarettes    Start date: 18    Quit date: 2005    Years since quitting: 21.0   Smokeless tobacco: Never   Tobacco comments:    Smoked for 10 years- smoked 0.25  Vaping Use   Vaping status: Never Used  Substance Use Topics   Alcohol use: No    Alcohol/week: 0.0 standard drinks of alcohol   Drug use: No   "

## 2024-06-06 ENCOUNTER — Ambulatory Visit: Payer: Self-pay

## 2024-06-06 NOTE — Telephone Encounter (Signed)
 FYI Only or Action Required?: Action required by provider: update on patient condition.  Patient was last seen in primary care on 06/03/2024 by Manya Toribio SQUIBB, PA.  Called Nurse Triage reporting Chest Injury.  Symptoms began today.  Interventions attempted: Rest, hydration, or home remedies.  Symptoms are: unchanged.  Triage Disposition: Home Care  Patient/caregiver understands and will follow disposition?: yes  Copied from CRM #[id number]. Topic: Clinical - Red Word Triage >> Jun 06, 2024  1:37 PM Amanda Liu wrote: Red Word that prompted transfer to Nurse Triage:  Earlier this morning, she has cough so much that she feels bruised right below her bra line in the middle. She can see a bruise. Now, she is cramping in this area. She has a fear of coughing and it will hurt that area. Reason for Disposition  [1] Chest wall bruise, pain, or swelling AND [2] present < 7 days  Answer Assessment - Initial Assessment Questions 1. MECHANISM: How did the injury happen?     coughing 2. ONSET: When did the injury happen? (.e.g., minutes, hours, days ago)     This morning 3. LOCATION: Where on the chest is the injury located?     Under ribs 4. APPEARANCE: What does the injury look like?     bruised 5. BLEEDING: Is there any bleeding now? If Yes, ask: How long has it been bleeding?     denies 6. SEVERITY: Any difficulty with breathing?     denies 8. PAIN: Is there pain? If Yes, ask: How bad is the pain? (e.g., Scale 0-10; none, mild, moderate, severe)     Moderate currently, was severe  Protocols used: Chest Injury-A-AH

## 2024-06-06 NOTE — Telephone Encounter (Signed)
 FYI  KP

## 2024-06-07 NOTE — Telephone Encounter (Signed)
 Spoke with patient. Bruise and pain are improving. Overall doing better. Last day of z-pack is today.

## 2024-06-11 ENCOUNTER — Other Ambulatory Visit: Payer: Self-pay | Admitting: Physician Assistant

## 2024-06-11 DIAGNOSIS — G4700 Insomnia, unspecified: Secondary | ICD-10-CM

## 2024-06-11 NOTE — Telephone Encounter (Signed)
 Requested Prescriptions  Pending Prescriptions Disp Refills   traZODone  (DESYREL ) 50 MG tablet [Pharmacy Med Name: TRAZODONE  HYDROCHLORIDE 50 MG Oral Tablet] 45 tablet 0    Sig: TAKE 1/2 TABLET AT BEDTIME AS NEEDED FOR SLEEP     Psychiatry: Antidepressants - Serotonin Modulator Passed - 06/11/2024  4:23 PM      Passed - Valid encounter within last 6 months    Recent Outpatient Visits           1 week ago Flu-like symptoms   [facility] at [facility], Toribio SQUIBB, [place]   3 months ago Allergic rhinitis with postnasal drip   [place], Toribio SQUIBB, [place]   4 months ago Allergic rhinitis, unspecified seasonality, unspecified trigger   [facility] at [facility], Toribio SQUIBB, [place]   10 months ago Rash   [facility] at [facility], Toribio SQUIBB, [place]

## 2024-06-14 ENCOUNTER — Encounter: Payer: Self-pay | Admitting: Physician Assistant

## 2024-06-14 ENCOUNTER — Ambulatory Visit (INDEPENDENT_AMBULATORY_CARE_PROVIDER_SITE_OTHER): Admitting: Physician Assistant

## 2024-06-14 VITALS — BP 110/84 | HR 99 | Temp 97.6°F | Ht 66.0 in | Wt 155.0 lb

## 2024-06-14 DIAGNOSIS — R600 Localized edema: Secondary | ICD-10-CM | POA: Diagnosis not present

## 2024-06-14 NOTE — Progress Notes (Signed)
 "   Date:  06/14/2024   Name:  Amanda Liu   DOB:  06-17-1955   MRN:  [id number]   Chief Complaint: Arm Swelling (X2 days, Not hard, not warm to touch, spot on left arm tender , tips of fingers numb, when raising arm up in the air goes away)  HPI  Amanda Liu returns to clinic today finally feeling better after recent flu-like illness but with a new concern for puffiness of the left forearm primarily. Also has intermittent tingling in the left 1st-3rd fingers, hx neuropathy but has not had any issues with the hands in a long time. Says when she rests her arm on top of her head for a while, both the swelling and the tingling improve. She sleeps on her left side primarily. She has has hx left lumpectomy with lymphadenectomy and radiation.   Otherwise she feels well, explicitly denies chest pain or sudden shortness of breath. Wonders if forceful repeated coughing from her recent illness might have something to do with it.   Medication list has been reviewed and updated.  Active Medications[1]   Review of Systems  Patient Active Problem List   Diagnosis Date Noted   Rash 08/15/2023   Hyperlipidemia 06/08/2022   Low back pain 06/08/2022   Bilateral knee pain 10/18/2021   Insomnia 03/03/2018   Bilateral carpal tunnel syndrome 10/31/2017   Tremor 02/17/2016   Allergic rhinitis with postnasal drip 02/17/2016   Essential hypertension 11/17/2015   History of left breast cancer 11/17/2015   Stress incontinence 11/17/2015   Osteopenia 10/28/2014    Allergies[2]  Immunization History  Administered Date(s) Administered   Influenza,inj,Quad PF,6+ Mos 02/25/2015, 03/18/2016, 05/01/2017, 02/20/2018    Past Surgical History:  Procedure Laterality Date   ABDOMINAL HYSTERECTOMY     BREAST BIOPSY Left 07/2011   left breast invasive mammary carcinoma   BREAST LUMPECTOMY Left 03/16/2012   invasive mammary carcinoma: invasive ductal carcinoma. clear margins after reexcision. 5 LN positive  for macrometastasis   TONSILLECTOMY     TUBAL LIGATION      Social History[3]  Family History  Problem Relation Age of Onset   Alcohol abuse Father    Breast cancer Sister 3   Post-traumatic stress disorder Daughter        mental/emotional abuse   Eating disorder Daughter    Stroke Maternal Grandmother    Breast cancer Maternal Grandmother 14   Alcoholism Other        Parent        06/03/2024   11:10 AM 01/31/2024    9:32 AM 08/15/2023   10:27 AM 06/13/2023    2:42 PM  GAD 7 : Generalized Anxiety Score  Nervous, Anxious, on Edge 0 1 1 3   Control/stop worrying 0 1 0 3  Worry too much - different things  1 0 0  Trouble relaxing  0 0 3  Restless  0 0 3  Easily annoyed or irritable  0 0 0  Afraid - awful might happen  0 0 0  Total GAD 7 Score  3 1 12   Anxiety Difficulty  Not difficult at all Not difficult at all Somewhat difficult       06/03/2024   11:10 AM 04/11/2024    9:04 AM 01/31/2024    9:32 AM  Depression screen PHQ 2/9  Decreased Interest 0 0 0  Down, Depressed, Hopeless 0 0 0  PHQ - 2 Score 0 0 0  Altered sleeping  0   Tired, decreased  energy  0   Change in appetite  0   Feeling bad or failure about yourself   0   Trouble concentrating  0   Moving slowly or fidgety/restless  0   Suicidal thoughts  0   PHQ-9 Score  0     BP Readings from Last 3 Encounters:  06/14/24 110/84  06/03/24 100/68  05/31/24 106/74    Wt Readings from Last 3 Encounters:  06/14/24 155 lb (70.3 kg)  06/03/24 153 lb (69.4 kg)  04/11/24 158 lb (71.7 kg)    BP 110/84   Pulse 99   Temp 97.6 F (36.4 C)   Ht 5' 6 (1.676 m)   Wt 155 lb (70.3 kg)   SpO2 96%   BMI 25.02 kg/m   Physical Exam Vitals and nursing note reviewed.  Constitutional:      Appearance: Normal appearance.  Cardiovascular:     Rate and Rhythm: Normal rate and regular rhythm.     Heart sounds: No murmur heard.    No friction rub. No gallop.  Pulmonary:     Effort: Pulmonary effort is normal.      Breath sounds: Normal breath sounds.  Abdominal:     General: There is no distension.  Musculoskeletal:        General: Normal range of motion.     Comments: Left forearm is in fact swollen compared to right but not tense, tender, warm, or erythematous aside from a subtle pink along the medial left forearm. No masses appreciated in the axilla. There is muscle tension in the left trapezius. ROM is preserved. Tinels negative.   Skin:    General: Skin is warm and dry.  Neurological:     Mental Status: She is alert and oriented to person, place, and time.     Gait: Gait is intact.  Psychiatric:        Mood and Affect: Mood and affect normal.     Recent Labs     Component Value Date/Time   NA 136 06/13/2023 1549   NA 134 (L) 08/27/2014 1127   K 3.9 06/13/2023 1549   K 3.3 (L) 08/27/2014 1127   CL 95 (L) 06/13/2023 1549   CL 101 08/27/2014 1127   CO2 28 06/13/2023 1549   CO2 25 08/27/2014 1127   GLUCOSE 95 06/13/2023 1549   GLUCOSE 105 (H) 06/08/2022 0909   GLUCOSE 140 (H) 08/27/2014 1127   BUN 9 06/13/2023 1549   BUN 13 08/27/2014 1127   CREATININE 0.58 06/13/2023 1549   CREATININE 0.58 08/27/2014 1127   CALCIUM  9.9 06/13/2023 1549   CALCIUM  9.4 08/27/2014 1127   PROT 7.1 06/13/2023 1549   PROT 7.5 08/27/2014 1127   ALBUMIN 4.8 06/13/2023 1549   ALBUMIN 4.7 08/27/2014 1127   AST 21 06/13/2023 1549   AST 24 08/27/2014 1127   ALT 16 06/13/2023 1549   ALT 19 08/27/2014 1127   ALKPHOS 63 06/13/2023 1549   ALKPHOS 53 08/27/2014 1127   BILITOT 0.3 06/13/2023 1549   BILITOT 0.6 08/27/2014 1127   GFRNONAA >60 06/01/2017 0848   GFRNONAA >60 08/27/2014 1127   GFRAA >60 06/01/2017 0848   GFRAA >60 08/27/2014 1127    Lab Results  Component Value Date   WBC 5.7 06/13/2023   HGB 12.3 06/13/2023   HCT 38.6 06/13/2023   MCV 92 06/13/2023   PLT 243 06/13/2023   Lab Results  Component Value Date   HGBA1C 6.1 (H) 06/13/2023   HGBA1C 6.3  06/08/2022   HGBA1C 6.0 10/18/2021    Lab Results  Component Value Date   CHOL 141 06/13/2023   HDL 74 06/13/2023   LDLCALC 48 06/13/2023   LDLDIRECT 46.0 01/21/2022   TRIG 104 06/13/2023   CHOLHDL 1.9 06/13/2023   Lab Results  Component Value Date   TSH 3.140 06/13/2023        Assessment & Plan Edema of left upper extremity It is possible that local inflammation from an aggravated/pulled muscle may be compressing a nerve and/or some vasculature in the arm. Consider also mild lymphedema with surgical history. In any event, I am not concerned at this time for an acute process, and have advised elevation of the LUE as able (but to 90 degrees at the shoulder/axilla not 180) as well as supine sleeping instead of on the left arm. Patient to monitor and if any sudden worsening, present to urgent care or ER. She is in agreement with plan.        Rolan Hoyle, PA-C, DMSc, DipACLM, Nutritionist [place] [facility] [phone]      [1]  Current Meds  Medication Sig   amLODipine  (NORVASC ) 5 MG tablet Take 1 tablet (5 mg total) by mouth daily.   Calcium  200 MG TABS Take by mouth.   Cetirizine HCl (ZYRTEC PO) Take by mouth as needed.   guaiFENesin -codeine  100-10 MG/5ML syrup Take 5 mLs by mouth 3 (three) times daily as needed.   hydrochlorothiazide  (HYDRODIURIL ) 25 MG tablet Take 1 tablet (25 mg total) by mouth daily.   hydrOXYzine  (ATARAX ) 25 MG tablet Take 1 tablet (25 mg total) by mouth at bedtime. For rash   lisinopril  (ZESTRIL ) 10 MG tablet Take 1 tablet (10 mg total) by mouth daily.   Loratadine (CLARITIN PO) Take by mouth as needed.   rosuvastatin  (CRESTOR ) 10 MG tablet TAKE 1 TABLET EVERY DAY   traZODone  (DESYREL ) 50 MG tablet TAKE 1/2 TABLET AT BEDTIME AS NEEDED FOR SLEEP  [2] No Known Allergies [3]  Social History Tobacco Use   Smoking status: Former    Current packs/day: 0.00    Average packs/day: 0.3 packs/day for 10.0 years  (2.5 ttl pk-yrs)    Types: Cigarettes    Start date: 30    Quit date: 2005    Years since quitting: 21.0   Smokeless tobacco: Never   Tobacco comments:    Smoked for 10 years- smoked 0.25  Vaping Use   Vaping status: Never Used  Substance Use Topics   Alcohol use: No    Alcohol/week: 0.0 standard drinks of alcohol   Drug use: No   "

## 2024-07-04 ENCOUNTER — Other Ambulatory Visit: Payer: Self-pay | Admitting: Physician Assistant

## 2024-07-04 DIAGNOSIS — I10 Essential (primary) hypertension: Secondary | ICD-10-CM

## 2024-07-04 DIAGNOSIS — E785 Hyperlipidemia, unspecified: Secondary | ICD-10-CM

## 2024-07-05 NOTE — Telephone Encounter (Signed)
 Requested medications are due for refill today.  Yes - all of them  Requested medications are on the active medications list.  yes  Last refill. 07/18/2023 and 01/11/2024  Future visit scheduled.   yes  Notes to clinic.  Pt has been seen several times recently for acute visits. Expired labs.    Requested Prescriptions  Pending Prescriptions Disp Refills   amLODipine  (NORVASC ) 5 MG tablet [Pharmacy Med Name: AMLODIPINE  BESYLATE 5 MG Oral Tablet] 90 tablet 3    Sig: TAKE 1 TABLET EVERY DAY     Cardiovascular: Calcium  Channel Blockers 2 Passed - 07/05/2024  3:16 PM      Passed - Last BP in normal range    BP Readings from Last 1 Encounters:  06/14/24 110/84         Passed - Last Heart Rate in normal range    Pulse Readings from Last 1 Encounters:  06/14/24 99         Passed - Valid encounter within last 6 months    Recent Outpatient Visits           3 weeks ago Edema of left upper extremity   [place] [facility], [place]QU[place]B, [place]   1 month ago Flu-like symptoms   [facility] at [facility], [place]   4 months ago Allergic rhinitis with postnasal drip   [place] [facility] at [facility], [place]   5 months ago Allergic rhinitis, unspecified seasonality, unspecified trigger   [place] [facility] at [facility], [place]QU[place]   10 months ago Rash   [facility] at [facility], Toribio SQUIBB, [place]               lisinopril  (ZESTRIL ) 10 MG tablet [Pharmacy Med Name: LISINOPRIL  10 MG Oral Tablet] 90 tablet 3    Sig: TAKE 1 TABLET EVERY DAY     Cardiovascular:  ACE Inhibitors Failed - 07/05/2024  3:16 PM      Failed - Cr in normal range and within 180 days    Creatinine  Date Value Ref Range Status  08/27/2014 0.58 mg/dL Final    Comment:    9.55-8.99 NOTE: New  Reference Range  08/05/14    Creatinine, Ser  Date Value Ref Range Status  06/13/2023 0.58 0.57 - 1.00 mg/dL Final         Failed - K in normal range and within 180 days    Potassium  Date Value Ref Range Status  06/13/2023 3.9 3.5 - 5.2 mmol/L Final  08/27/2014 3.3 (L) mmol/L Final    Comment:    3.5-5.1 NOTE: New Reference Range  08/05/14          Passed - Patient is not pregnant      Passed - Last BP in normal range    BP Readings from Last 1 Encounters:  06/14/24 110/84         Passed - Valid encounter within last 6 months    Recent Outpatient Visits           3 weeks ago Edema of left upper extremity    [facility] at [facility], [place]   1 month ago Flu-like symptoms   [facility] at [facility]  [place]QU[place]   4 months ago Allergic rhinitis with postnasal drip   [place] [facility] at [facility], Toribio SQUIBB, [place]   5 months ago Allergic rhinitis, unspecified seasonality, unspecified trigger   [place] [facility] at [facility], [place]QU[place]   10 months ago Rash   [facility] at [facility], Toribio SQUIBB, [place]               hydrochlorothiazide  (HYDRODIURIL ) 25 MG tablet [Pharmacy Med Name: HYDROCHLOROTHIAZIDE  25 MG Oral Tablet] 90 tablet 3    Sig: TAKE 1 TABLET EVERY DAY     Cardiovascular: Diuretics - Thiazide Failed - 07/05/2024  3:16 PM      Failed - Cr in normal range and within 180 days    Creatinine  Date Value Ref Range Status  08/27/2014 0.58 mg/dL Final    Comment:    9.55-8.99 NOTE: New Reference Range  08/05/14    Creatinine, Ser  Date Value Ref Range Status  06/13/2023 0.58 0.57 - 1.00 mg/dL Final         Failed - K in normal range and within 180 days    Potassium  Date Value Ref Range Status  06/13/2023 3.9 3.5 - 5.2 mmol/L Final   08/27/2014 3.3 (L) mmol/L Final    Comment:    3.5-5.1 NOTE: New Reference Range  08/05/14          Failed - Na in normal range and within 180 days    Sodium  Date Value Ref Range Status  06/13/2023 136 134 - 144 mmol/L Final  08/27/2014 134 (L) mmol/L Final    Comment:    135-145 NOTE: New Reference Range  08/05/14          Passed - Last BP in normal range    BP Readings from Last 1 Encounters:  06/14/24 110/84         Passed - Valid encounter within last 6 months    Recent Outpatient Visits           3 weeks ago Edema of left upper extremity   [place] [facility] [place], [place]   1 month ago Flu-like symptoms   [facility] at [facility], [place]   4 months ago Allergic rhinitis with postnasal drip   [place] [facility] at [facility], [place]   5 months ago Allergic rhinitis, unspecified seasonality, unspecified trigger   [place] [facility] at [facility], Tor[place]QU[place]   10 months ago Rash   [facility] at [facility], Toribio SQUIBB, [place]               rosuvastatin  (CRESTOR ) 10 MG tablet [Pharmacy Med Name: ROSUVASTATIN  CALCIUM  10 MG Oral Tablet] 90 tablet 3    Sig: TAKE 1 TABLET EVERY DAY     Cardiovascular:  Antilipid - Statins 2 Failed - 07/05/2024  3:16 PM      Failed - Cr in normal range and within 360 days    Creatinine  Date Value Ref Range Status  08/27/2014 0.58 mg/dL Final    Comment:    9.55-8.99 NOTE: New Reference Range  08/05/14    Creatinine, Ser  Date Value Ref  Range Status  06/13/2023 0.58 0.57 - 1.00 mg/dL Final         Failed - Lipid Panel in normal range within the last 12 months    Cholesterol, Total  Date Value Ref Range Status  06/13/2023 141 100 - 199 mg/dL Final   LDL Chol Calc (NIH)  Date Value Ref Range  Status  06/13/2023 48 0 - 99 mg/dL Final   Direct LDL  Date Value Ref Range Status  01/21/2022 46.0 mg/dL Final    Comment:    Optimal:  <100 mg/dLNear or Above Optimal:  100-129 mg/dLBorderline High:  130-159 mg/dLHigh:  160-189 mg/dLVery High:  >190 mg/dL   HDL  Date Value Ref Range Status  06/13/2023 74 >39 mg/dL Final   Triglycerides  Date Value Ref Range Status  06/13/2023 104 0 - 149 mg/dL Final         Passed - Patient is not pregnant      Passed - Valid encounter within last 12 months    Recent Outpatient Visits           3 weeks ago Edema of left upper extremity   [place] [facility] [place], Tor[place]   1 month ago Flu-like symptoms   [facility] at [facility], [place]QU[place]   4 months ago Allergic rhinitis with postnasal drip   [facility] at [facility], [place]   5 months ago Allergic rhinitis, unspecified seasonality, unspecified trigger   [facility] at [facility], [place]   10 months ago Rash   [facility] at [facility], Toribio SQUIBB, [place]

## 2025-04-17 ENCOUNTER — Ambulatory Visit
